# Patient Record
Sex: Female | Born: 1950
Health system: Southern US, Community
[De-identification: ages and names within clinical notes are randomized; demographics above are authoritative.]

## PROBLEM LIST (undated history)

## (undated) DIAGNOSIS — F419 Anxiety disorder, unspecified: Secondary | ICD-10-CM

## (undated) DIAGNOSIS — G8929 Other chronic pain: Secondary | ICD-10-CM

## (undated) DIAGNOSIS — I10 Essential (primary) hypertension: Secondary | ICD-10-CM

## (undated) DIAGNOSIS — F32A Depression, unspecified: Secondary | ICD-10-CM

## (undated) DIAGNOSIS — R7303 Prediabetes: Secondary | ICD-10-CM

## (undated) DIAGNOSIS — M549 Dorsalgia, unspecified: Secondary | ICD-10-CM

## (undated) DIAGNOSIS — M542 Cervicalgia: Secondary | ICD-10-CM

## (undated) DIAGNOSIS — G709 Myoneural disorder, unspecified: Secondary | ICD-10-CM

## (undated) DIAGNOSIS — M4800 Spinal stenosis, site unspecified: Secondary | ICD-10-CM

## (undated) HISTORY — PX: FOOT SURGERY: SHX648

## (undated) HISTORY — DX: Dorsalgia, unspecified: M54.9

## (undated) HISTORY — DX: Prediabetes: R73.03

## (undated) HISTORY — DX: Spinal stenosis, site unspecified: M48.00

## (undated) HISTORY — PX: KNEE ARTHROSCOPY: SHX127

## (undated) HISTORY — DX: Cervicalgia: M54.2

## (undated) HISTORY — DX: Other chronic pain: G89.29

---

## 1998-07-16 ENCOUNTER — Encounter: Payer: Self-pay | Admitting: Neurosurgery

## 1998-07-16 ENCOUNTER — Ambulatory Visit (HOSPITAL_COMMUNITY): Admission: RE | Admit: 1998-07-16 | Discharge: 1998-07-16 | Payer: Self-pay | Admitting: Neurosurgery

## 1998-08-01 ENCOUNTER — Ambulatory Visit (HOSPITAL_COMMUNITY): Admission: RE | Admit: 1998-08-01 | Discharge: 1998-08-01 | Payer: Self-pay | Admitting: Neurosurgery

## 1998-08-01 ENCOUNTER — Encounter: Payer: Self-pay | Admitting: Neurosurgery

## 1998-08-20 ENCOUNTER — Encounter: Payer: Self-pay | Admitting: Neurosurgery

## 1998-08-20 ENCOUNTER — Ambulatory Visit (HOSPITAL_COMMUNITY): Admission: RE | Admit: 1998-08-20 | Discharge: 1998-08-20 | Payer: Self-pay | Admitting: Neurosurgery

## 1998-12-24 ENCOUNTER — Emergency Department (HOSPITAL_COMMUNITY): Admission: EM | Admit: 1998-12-24 | Discharge: 1998-12-24 | Payer: Self-pay | Admitting: Emergency Medicine

## 1999-04-24 ENCOUNTER — Encounter: Payer: Self-pay | Admitting: Neurosurgery

## 1999-04-24 ENCOUNTER — Ambulatory Visit (HOSPITAL_COMMUNITY): Admission: RE | Admit: 1999-04-24 | Discharge: 1999-04-24 | Payer: Self-pay | Admitting: Neurosurgery

## 1999-05-23 ENCOUNTER — Encounter: Payer: Self-pay | Admitting: Neurosurgery

## 1999-05-23 ENCOUNTER — Ambulatory Visit (HOSPITAL_COMMUNITY): Admission: RE | Admit: 1999-05-23 | Discharge: 1999-05-23 | Payer: Self-pay | Admitting: Neurosurgery

## 2003-08-12 HISTORY — PX: BACK SURGERY: SHX140

## 2003-10-02 ENCOUNTER — Encounter: Admission: RE | Admit: 2003-10-02 | Discharge: 2003-10-02 | Payer: Self-pay | Admitting: *Deleted

## 2004-07-17 ENCOUNTER — Ambulatory Visit: Payer: Self-pay | Admitting: Physical Medicine & Rehabilitation

## 2004-07-17 ENCOUNTER — Inpatient Hospital Stay (HOSPITAL_COMMUNITY): Admission: RE | Admit: 2004-07-17 | Discharge: 2004-07-22 | Payer: Self-pay | Admitting: Orthopaedic Surgery

## 2005-02-06 ENCOUNTER — Ambulatory Visit: Payer: Self-pay

## 2005-11-21 ENCOUNTER — Ambulatory Visit: Payer: Self-pay

## 2008-03-21 ENCOUNTER — Ambulatory Visit: Payer: Self-pay

## 2009-05-17 ENCOUNTER — Observation Stay (HOSPITAL_COMMUNITY): Admission: EM | Admit: 2009-05-17 | Discharge: 2009-05-18 | Payer: Self-pay | Admitting: Emergency Medicine

## 2010-04-09 ENCOUNTER — Ambulatory Visit: Payer: Self-pay

## 2010-11-14 LAB — URINE CULTURE: Colony Count: 30000

## 2010-11-14 LAB — BASIC METABOLIC PANEL
BUN: 12 mg/dL (ref 6–23)
CO2: 27 mEq/L (ref 19–32)
Calcium: 9.1 mg/dL (ref 8.4–10.5)
Chloride: 102 mEq/L (ref 96–112)
Creatinine, Ser: 0.65 mg/dL (ref 0.4–1.2)
GFR calc Af Amer: >60 mL/min (ref >60)
GFR calc non Af Amer: >60 mL/min (ref >60)
Glucose, Bld: 91 mg/dL (ref 70–99)
Potassium: 3.8 mEq/L (ref 3.5–5.1)
Sodium: 138 mEq/L (ref 135–145)

## 2010-11-14 LAB — DIFFERENTIAL
Basophils Absolute: 0 10*3/uL (ref 0.0–0.1)
Basophils Relative: 0 % (ref 0–1)
Eosinophils Absolute: 0.1 10*3/uL (ref 0.0–0.7)
Eosinophils Relative: 1 % (ref 0–5)
Lymphocytes Relative: 27 % (ref 12–46)
Lymphs Abs: 2.4 10*3/uL (ref 0.7–4.0)
Monocytes Absolute: 0.6 10*3/uL (ref 0.1–1.0)
Monocytes Relative: 7 % (ref 3–12)
Neutro Abs: 5.6 10*3/uL (ref 1.7–7.7)
Neutrophils Relative %: 64 % (ref 43–77)

## 2010-11-14 LAB — CBC
HCT: 39.8 % (ref 36.0–46.0)
Hemoglobin: 13.5 g/dL (ref 12.0–15.0)
MCHC: 33.9 g/dL (ref 30.0–36.0)
MCV: 93.4 fL (ref 78.0–100.0)
Platelets: 223 10*3/uL (ref 150–400)
RBC: 4.27 MIL/uL (ref 3.87–5.11)
RDW: 13.3 % (ref 11.5–15.5)
WBC: 8.7 10*3/uL (ref 4.0–10.5)

## 2010-11-14 LAB — URINALYSIS, ROUTINE W REFLEX MICROSCOPIC
Bilirubin Urine: NEGATIVE
Glucose, UA: NEGATIVE mg/dL
Hgb urine dipstick: NEGATIVE
Ketones, ur: NEGATIVE mg/dL
Nitrite: NEGATIVE
Protein, ur: NEGATIVE mg/dL
Specific Gravity, Urine: 1.01 (ref 1.005–1.030)
Urobilinogen, UA: 0.2 mg/dL (ref 0.0–1.0)
pH: 7 (ref 5.0–8.0)

## 2010-12-27 NOTE — H&P (Signed)
NAME:  Christina Ware, Christina Ware NO.:  [id number]   MEDICAL RECORD NO.:  [id number]          PATIENT TYPE:  INP   LOCATION:  NA                           FACILITY:  [facility]   PHYSICIAN:  Sharolyn Douglas, M.D.        DATE OF BIRTH:  12-13-1950   DATE OF ADMISSION:  DATE OF DISCHARGE:                                HISTORY & PHYSICAL   DATE OF ADMISSION:  July 17, 2004   CHIEF COMPLAINT:  Pain in my back and legs.   PRESENT ILLNESS:  This 60 year old white female seen by [place] for progressive  problems concerning low-back pain.  She states this pain has been going on  for nearly 35 years.  She has been trying to keep going as far as her day-to-  day activity is concerned, but has also seen at least 12 physicians for her  back problems.  Now she gets to the point where she has difficulty walking,  sitting, or even doing day-to-day housework.  She has had  antiinflammatories, epidural steroid injection therapy, chiropractic care,  bracing, etc.  Unfortunately, she continues with this pain.  She is  divorced.  She tries to maintain her own lifestyle but this pain is markedly  interfering with her day-to-day activities.  Degenerative changes are seen  throughout the lumbar spine but mostly at the L4-5 region.  MRI has been  done and it showed multiple-level degenerative disc disease most pronounced  at L3-L4, L4-L5, or L5-S1.  Severe foraminal narrowing is seen as well as  lateral recess stenosis.  After much consideration and taking to hand the  patient's current overall dissatisfaction with her life secondary to the  pain, it was felt she would benefit from surgical intervention, being  admitted for back surgery.   PAST MEDICAL HISTORY:  This lady has been in relatively good health  throughout her lifetime.  She has no medical allergies and her current  medications are:  1.  Diazepam 10 mg one daily,  2.  Carisoprodol 300 mg one to three a day.  3.  Ibuprofen 800 mg daily.  4.   Darvocet for pain.  5.  Clarinex p.r.n.   PAST SURGERIES:  Include arthoscopic surgery of the right knee in 1987.   FAMILY HISTORY:  Positive for heart disease in her father and brother who is  deceased, hypertension in her father and mother and brothers, diabetes in  nearly her entire family, prostate cancer in the father, and two mini  strokes in the father as well.   SOCIAL HISTORY:  The patient is divorced, disabled.  No intake of alcohol or  tobacco products.  Has no children.  Caregiver after surgery will be her  mother.   REVIEW OF SYSTEMS:  CNS:  No seizure disorder, paralysis, numbness, or  double vision.  The patient does have radiculitis in both lower extremities.  CARDIOVASCULAR:  No chest pain, no angina or orthopnea.  RESPIRATORY:  No  productive cough, no hemoptysis, no shortness of breath.  GASTROINTESTINAL:  No nausea, vomiting, melena, or bloody stools.  GENITOURINARY:  No  discharge, dysuria, hematuria.  MUSCULOSKELETAL:  Primarily in present  illness.   PHYSICAL EXAMINATION:  GENERAL:  Alert, cooperative, friendly, somewhat  anxious 60 year old white female.  VITAL SIGNS:  Blood pressure 128/62, pulse 80, respirations are 12.  HEENT:  Normocephalic, PERRLA, EOM intact, oropharynx is clear.  CHEST:  Clear to auscultation, no rhonchi, no rales.  HEART:  Regular rate and rhythm, no murmurs are heard.  ABDOMEN:  Soft, nontender.  Liver and spleen not felt.  GENITOURINARY, RECTAL, PELVIC, BREAST:  Not done, not pertinent to present  illness.  EXTREMITIES:  No deformities and straight leg raise negative bilaterally.   ADMITTING DIAGNOSIS:  Degenerative disc disease with spinal stenosis.   PLAN:  The patient will undergo from L3-S1 laminectomy with posterior spinal  fusion, transforaminal lumbar interbody fusion with pedicle screws,  allograft bone, morphogenic protein, and possible posterior iliac crest bone  graft.  She is fitted today with an EBI lumbar orthosis.   This history and  physical is performed in the [facility] office on July 10, 2004.  She has had medical clearance from Dr. Raliegh Ip, [facility] in  [place], [place].       DLU/MEDQ  D:  07/10/2004  T:  07/10/2004  Job:  [id number]   cc:   Loma Sender  [address]  [place]  [place] [address]  Fax: [phone]

## 2010-12-27 NOTE — Op Note (Signed)
NAME:  Christina Ware, Christina Ware NO.:  [id number]   MEDICAL RECORD NO.:  [id number]          PATIENT TYPE:  INP   LOCATION:  5025                         FACILITY:  [facility]   PHYSICIAN:  Sharolyn Douglas, M.D.        DATE OF BIRTH:  June 26, 1951   DATE OF PROCEDURE:  DATE OF DISCHARGE:                                 OPERATIVE REPORT   DIAGNOSIS:  Lumbar degenerative disk disease, spondylosis and spinal  stenosis, L3-S1.   PROCEDURE:  1.  Left L3-S1 hemilaminectomies with decompression of the L3-S1 nerve      roots.  2.  Transforaminal lumbar interbody fusion, L3-S1, placement of three peak      cages.  3.  Posterior spinal arthrodesis, L3-S1.  4.  Segmental pedicle screw instrumentation, L3-S1 using the spinal Concept      system.  5.  Local autogenous bone graft supplemented with graft on and bone      morphogenic protein.  6.  Neuro monitoring with testing of eight pedicle screws and monitoring of      free running electromyelograms x 3 hours.   SURGEON:  Sharolyn Douglas, M.D.   ASSISTANT:  Verlin Fester, P.A.   ANESTHESIA:  General endotracheal.   COMPLICATIONS:  None.   INDICATIONS:  The patient is a pleasant 60 year old female with intractable  back and bilateral lower extremity pain, left greater than right.  This is  felt to be secondary to lateral recess and foraminal narrowing, L3-S1 with  associated degenerative disk disease and spondylosis.  She has elected to  undergo L3-S1 decompression and fusion in hopes of improving her symptoms.  The risks, benefits and alternatives were extensively reviewed.   PROCEDURE:  The patient was properly identified in the holding area, taken  to the operating room.  She underwent general endotracheal anesthesia  without difficulty and prophylactic IV antibiotics.  Carefully positioned  prone on the Wilson frame.  All bony prominences padded.  Face unobstructed  at all times.  The back prepped and draped in the usual sterile fashion.   A  midline incision made from L2-S1.  Dissection carried through the deep  fascia.  Subperiosteal exposure carried out to the tips of the transverse  processes, L3-L5 as well as the sacral ala bilaterally.  Deep retractors  placed.  Anatomic landmarks defined.  A hemilaminectomy completed at L3-S1  on the left side.  The L3-S1 nerve roots were identified and completely  decompressed in the lateral recess as well as out the respective foramen.  We then turned our attention to completing transforaminal lumbar interbody  fusions at each level.  The pars interarticularis was osteotomized at L3-L5  removing the inferior facet.  This appears that was cut flush with the  pedicle.  This created the transforaminal window.  Free-running EMGs were  monitored throughout the T-lift procedures, and there were no deleterious  changes.  The exiting and transversing nerve roots were identified and  protected at all times.  At each level, sharp annulotomy was utilized  followed by a radical diskectomy across the contralateral side using  specialized T-lift curettes.  The  cartilage endplates were scraped.  The  interbody spaces were dilated up using intervertebral spreaders.  We also  achieved distraction on the spinous processes.  The disk spaces were packed  with BMP sponges from a large infuse kit.  The interbody spaces were packed  with local autogenous bone graft from the laminectomies.  The peak cages  were packed with the BMP sponges and then inserted in an oblique fashion  into the interspaces.  We achieved good distraction at each level.  The  nerve roots were inspected, and there were no apparent injuries.  We then  turned our attention to completing the posterolateral arthrodesis.  High-  speed bur used to decorticate the transverse processes of L3-L5 and the  sacral ala bilaterally.  The remaining local autogenous bone graft along  with 15 mL of graft fill and allograft packed into the lateral  gutters.  We  then placed the pedicle screws at each level, L3-L5 as well as the sacrum  bilaterally using anatomic probing technique.  We utilized 6.5 x 45 mm  screws at L3-L5.  The 7.5 x 35 mm screws used in the sacrum bilaterally.  We  had good screw purchase.  Each screw was then stimulated using triggered  EMGs, and there were no deleterious changes.  Titanium rods were then bent  into lordosis and placed into polyaxial heads.  Compression was applied  across each segment before shearing off the locking caps.  Intraoperative x-  ray showed acceptable positioning of the instrumentation, L3-S1.  The wound  was irrigated. Gelfoam was left over the exposed epidural space.  Deep drain  left.  The deep fascia closed with a running #1 Vicryl suture.  Subcutaneous  layer closed with 2-0 Vicryl followed by a running 3-0 subcuticular Vicryl  suture on the skin edges.  Benzoin and Steri-Strips were placed.  A sterile  dressing applied.  The patient was turned supine and extubated without  difficulty and transferred to the recovery room in stable condition.       MC/MEDQ  D:  07/20/2004  T:  07/21/2004  Job:  [id number]   cc:   Dept of [facility]

## 2010-12-27 NOTE — Discharge Summary (Signed)
NAME:  Christina Ware, Christina Ware NO.:  [id number]   MEDICAL RECORD NO.:  [id number]          PATIENT TYPE:  INP   LOCATION:  5025                         FACILITY:  [facility]   PHYSICIAN:  Sharolyn Douglas, M.D.        DATE OF BIRTH:  27-Apr-1951   DATE OF ADMISSION:  07/17/2004  DATE OF DISCHARGE:  07/22/2004                                 DISCHARGE SUMMARY   ADMISSION DIAGNOSIS:  Degenerative disc disease with spinal stenosis at L3-  S1.   DISCHARGE DIAGNOSES:  1.  Status post L3-S1 posterior spinal fusion with pedicle screws and T-      lift, doing well.  2.  Postoperative anemia, stable, not requiring transfusion.   PROCEDURE:  On July 17, 2004, the patient was taken to the operating room  for L3-S1 posterior spinal fusion with pedicle screws with T-lift performed  by Dr. Sharolyn Douglas, assistant [facility].  Anesthesia was general.   CONSULTATIONS:  None.   LABORATORY DATA AND X-RAY FINDINGS:  Preoperatively, CBC with differential  was within normal limits with the exception of white count of 10.8.  H&H  were monitored postoperatively and reached a low of 9.2 and 26.5.  On  July 22, 2004, she was asymptomatic.  PT/INR and PTT preoperatively  normal.  Complete metabolic panel preoperatively normal.  Postoperatively,  BMET had slightly elevation of glucose at 143.  UA negative.  Blood type on  July 22, 2004, type A, antibody screen negative.   Portable spine was used intraoperatively for localization.  EKG from  December 2, showed normal sinus rhythm.  Cannot rule out anterior infarct  read by Dr. Charlton Haws.   HISTORY OF PRESENT ILLNESS:  The patient is a 60 year old female who has  been having progressive problems with back prior to surgery.  It has been  going on for in excess of 30 years.  Unfortunately, over the last several  years, it has progressed and gotten to the point that it was interfering  with her day to day activities.  She has tried  numerous types of  conservative management including antiinflammatory medications, narcotic  pain medication, epidural steroid injections, chiropractic care, activity  modification, all without any improvement in her pain.  Because of her  significant findings on x-ray as well as MRI scan and her failure to improve  with conservative measures, Dr. Noel Gerold felt her best course of management  would be the above-listed procedure.  He discussed the risks and benefits of  the procedure with the patient and seemed to indicate understanding and  opted to proceed.   HOSPITAL COURSE:  On July 17, 2004, the patient was taken to the  operating room for the above-listed procedure and tolerated procedure well  without any intraoperative complications.  There was one Hemovac drain  placed with approximately 250 cc of blood loss.  She was transferred to the  recovery room in stable condition.   Postoperatively, routine orthopedic spine protocol was followed.  She  progressed along very well and did not develop any complications.   On postop day #2, Hemovac drain was discontinued  without any difficulty.  Dressing changes were done.  Rehabilitation consult was ordered because she  was progressing slowly with physical therapy.  However, over the next  several days, she made significant improvement in her progress with therapy  and by July 22, 2004, the patient had met all orthopedic goals and was  doing very well.  She was stable and ready for discharge from an orthopedic  and medical standpoint.   SUMMARY:  The patient is a 60 year old female, status post L3-S1 posterior  spinal fusion doing well.   ACTIVITY:  Daily ambulation.  Back precautions at all times.  Brace on when  she is up.  No lifting heavier than 5 pounds.   WOUND CARE:  Dressing changes daily.  Keep incision dry until drainage is  completely stopped.   DISCHARGE MEDICATIONS:  1.  Robaxin as needed for muscle spasm.  2.   Percocet as needed for pain.  3.  Multivitamin daily.  4.  Calcium daily.  5.  Over-the-counter laxative as needed.  6.  Colace twice daily.  7.  Resume home medications.   DIET:  Regular home diet as tolerated.   FOLLOW UP:  Follow up in 2 weeks postoperatively with Dr. Noel Gerold.   CONDITION ON DISCHARGE:  Stable and improved.   DISPOSITION:  The patient is being discharged to her home with her family's  assistance as well as home health physical therapy and occupational therapy.      CM/MEDQ  D:  10/02/2004  T:  10/02/2004  Job:  [id number]

## 2011-05-01 ENCOUNTER — Ambulatory Visit: Payer: Self-pay

## 2012-05-14 ENCOUNTER — Observation Stay: Payer: Self-pay | Admitting: Internal Medicine

## 2012-05-14 LAB — URINALYSIS, COMPLETE
Bacteria: NONE SEEN
Bilirubin,UR: NEGATIVE
Blood: NEGATIVE
Glucose,UR: NEGATIVE mg/dL (ref 0–75)
Ketone: NEGATIVE
Leukocyte Esterase: NEGATIVE
Nitrite: NEGATIVE
Ph: 9 (ref 4.5–8.0)
Protein: NEGATIVE
RBC,UR: 1 /HPF (ref 0–5)
Specific Gravity: 1.02 (ref 1.003–1.030)
Squamous Epithelial: <1
WBC UR: 1 /HPF (ref 0–5)

## 2012-05-14 LAB — COMPREHENSIVE METABOLIC PANEL
Albumin: 3.7 g/dL (ref 3.4–5.0)
Alkaline Phosphatase: 99 U/L (ref 50–136)
Anion Gap: 13 (ref 7–16)
BUN: 16 mg/dL (ref 7–18)
Bilirubin,Total: 0.3 mg/dL (ref 0.2–1.0)
Calcium, Total: 9.1 mg/dL (ref 8.5–10.1)
Chloride: 104 mmol/L (ref 98–107)
Co2: 25 mmol/L (ref 21–32)
Creatinine: 0.74 mg/dL (ref 0.60–1.30)
EGFR (African American): >60
EGFR (Non-African Amer.): >60
Glucose: 153 mg/dL — ABNORMAL HIGH (ref 65–99)
Osmolality: 287 (ref 275–301)
Potassium: 3.8 mmol/L (ref 3.5–5.1)
SGOT(AST): 24 U/L (ref 15–37)
SGPT (ALT): 21 U/L (ref 12–78)
Sodium: 142 mmol/L (ref 136–145)
Total Protein: 7.6 g/dL (ref 6.4–8.2)

## 2012-05-14 LAB — CBC
HCT: 42 % (ref 35.0–47.0)
HGB: 13.8 g/dL (ref 12.0–16.0)
MCH: 29.9 pg (ref 26.0–34.0)
MCHC: 32.8 g/dL (ref 32.0–36.0)
MCV: 91 fL (ref 80–100)
Platelet: 246 10*3/uL (ref 150–440)
RBC: 4.6 10*6/uL (ref 3.80–5.20)
RDW: 13.6 % (ref 11.5–14.5)
WBC: 15.5 10*3/uL — ABNORMAL HIGH (ref 3.6–11.0)

## 2012-05-14 LAB — MAGNESIUM: Magnesium: 1.8 mg/dL

## 2012-05-14 LAB — TROPONIN I: Troponin-I: <0.02 ng/mL

## 2012-05-14 LAB — LIPASE, BLOOD: Lipase: 241 U/L (ref 73–393)

## 2012-05-15 LAB — COMPREHENSIVE METABOLIC PANEL
Albumin: 3.2 g/dL — ABNORMAL LOW (ref 3.4–5.0)
Alkaline Phosphatase: 80 U/L (ref 50–136)
Anion Gap: 9 (ref 7–16)
BUN: 7 mg/dL (ref 7–18)
Bilirubin,Total: 0.4 mg/dL (ref 0.2–1.0)
Calcium, Total: 8.2 mg/dL — ABNORMAL LOW (ref 8.5–10.1)
Chloride: 109 mmol/L — ABNORMAL HIGH (ref 98–107)
Co2: 26 mmol/L (ref 21–32)
Creatinine: 0.73 mg/dL (ref 0.60–1.30)
EGFR (African American): >60
EGFR (Non-African Amer.): >60
Glucose: 122 mg/dL — ABNORMAL HIGH (ref 65–99)
Osmolality: 286 (ref 275–301)
Potassium: 3.6 mmol/L (ref 3.5–5.1)
SGOT(AST): 19 U/L (ref 15–37)
SGPT (ALT): 17 U/L (ref 12–78)
Sodium: 144 mmol/L (ref 136–145)
Total Protein: 6.7 g/dL (ref 6.4–8.2)

## 2012-05-15 LAB — LIPID PANEL
Cholesterol: 155 mg/dL (ref 0–200)
HDL Cholesterol: 81 mg/dL — ABNORMAL HIGH (ref 40–60)
Ldl Cholesterol, Calc: 57 mg/dL (ref 0–100)
Triglycerides: 86 mg/dL (ref 0–200)
VLDL Cholesterol, Calc: 17 mg/dL (ref 5–40)

## 2012-05-15 LAB — LIPASE, BLOOD: Lipase: 57 U/L — ABNORMAL LOW (ref 73–393)

## 2012-06-29 ENCOUNTER — Ambulatory Visit: Payer: Self-pay

## 2013-04-13 ENCOUNTER — Ambulatory Visit: Payer: Self-pay | Admitting: Urology

## 2013-05-09 ENCOUNTER — Ambulatory Visit: Payer: Self-pay | Admitting: Urology

## 2013-05-13 ENCOUNTER — Ambulatory Visit: Payer: Self-pay | Admitting: Urology

## 2013-06-30 ENCOUNTER — Ambulatory Visit: Payer: Self-pay

## 2014-07-19 ENCOUNTER — Ambulatory Visit: Payer: Self-pay

## 2014-11-28 NOTE — H&P (Signed)
PATIENT NAME:  Christina Ware, [name] S MR#:  [id number] DATE OF BIRTH:  July 21, 1951  DATE OF ADMISSION:  05/14/2012  PRIMARY CARE PHYSICIAN: Marcine Matarharles W. Phillips Jr., MD   CHIEF COMPLAINT: Nausea, vomiting, diarrhea.   HISTORY OF PRESENT ILLNESS: The patient is a 64 year old female who presents to the Emergency Room due to sudden onset of nausea, vomiting, diarrhea that began late last night. The patient says she has had multiple episodes of nausea, vomiting, and diarrhea since last evening. The vomiting has been nonbloody and now bilious in nature, and the diarrhea initially started off with soft stool but is now watery in nature. She admits to some chills but no documented fever. She complains of abdominal cramping which is lower abdomen below the umbilicus. She said she has had a history of gastroenteritis before, and the symptoms are very similar to that. She ate some lasagna and some chocolate mousse that she bought from [employer] yesterday, although nobody else ate that food; so, therefore, it is unclear whether this is related to gastroenteritis. The patient did undergo a CT scan of the abdomen and pelvis without contrast which was suggestive of pancreatitis/duodenitis, although she has a normal lipase level. Hospitalist Services were contacted for further treatment and evaluation.   REVIEW OF SYSTEMS: CONSTITUTIONAL: No documented fever. No weight gain, no weight loss. EYES: No blurred or double vision. ENT: No tinnitus. No postnasal drip. No redness of the oropharynx. RESPIRATORY: No cough, no wheeze, no hemoptysis, and no dyspnea. CARDIOVASCULAR: No chest pain, no orthopnea, no palpitations, no syncope. GI: Positive nausea. Positive vomiting. Positive diarrhea. Positive lower abdominal pain, no melena or hematochezia. GU: No dysuria or hematuria. ENDOCRINE: No polyuria or nocturia. No heat or cold intolerance. HEMATOLOGIC: No anemia, no bruising, and no bleeding. INTEGUMENTARY: No rashes. No  lesions. MUSCULOSKELETAL: No arthritis, no swelling, no gout. NEUROLOGIC: No numbness, no tingling, no ataxia, no seizure type activity. PSYCHIATRIC: Positive anxiety. No insomnia, no ADD.    PAST MEDICAL HISTORY:  1. Anxiety.  2. Neuropathy.  3. Chronic pain syndrome.   ALLERGIES: Codeine, which causes a GI upset.   SOCIAL HISTORY: No smoking. No alcohol abuse. No illicit drug abuse. She lives at home by herself.   FAMILY HISTORY: The patient's mother died from complications of colon cancer. Her father died from complications of chronic obstructive pulmonary disease.   CURRENT MEDICATIONS:  1. Paxil 10 mg daily.  2. Gabapentin 800 mg t.i.d.  3. Fentanyl patch 75 mcg every 72 hours.  4. Oxycodone with Tylenol 10/325 every 6 hours as needed.   PHYSICAL EXAMINATION:  VITAL SIGNS: She is afebrile, pulse is 68, respirations 18, blood pressure 165/78, sats 97% on room air.   GENERAL: She is a very lethargic-appearing female moaning in mild-to-moderate distress.   HEENT: Atraumatic, normocephalic. Her extraocular muscles are intact. Pupils are equal and reactive to light. Her sclerae are anicteric. No conjunctival injection. No oropharyngeal erythema.   NECK: Supple. No jugular venous distention. No bruits, no lymphadenopathy or thyromegaly.   HEART: Regular rate and rhythm. No murmurs, no rubs, and no clicks.   LUNGS: Clear to auscultation anteriorly. No rales, no rhonchi, no wheezes.   ABDOMEN: Soft, flat, tender in the lower abdomen but no rebound, no rigidity. Hypoactive bowel sounds. No hepatosplenomegaly appreciated.   EXTREMITIES: No evidence of any cyanosis, clubbing, or peripheral edema. Has +2 pedal and radial pulses bilaterally.   NEUROLOGIC: The patient is alert, awake, and oriented x3, lethargic. Moves all extremities spontaneously. No  other focal motor or sensory deficits appreciated.   SKIN: Moist and warm with no rashes.   LYMPHATIC: There is no cervical or axillary  lymphadenopathy.   LABORATORY, DIAGNOSTIC AND RADIOLOGICAL DATA: Serum glucose 153, BUN 16, creatinine 0.7, sodium 142, potassium 3.8, chloride 104, bicarbonate 25. LFTs are within normal limits. Troponin less than 0.02. White cell count 15.5, hemoglobin 13.8, hematocrit 42, platelet count 246. Lactic acid is elevated at 2.7. The patient did have a CT of the abdomen and pelvis done without contrast which showed peripancreatic inflammatory changes around the pancreatic head which may represent pancreatitis versus duodenitis.   ASSESSMENT AND PLAN: This is a 64 year old female with past medical history of anxiety/depression, neuropathy, chronic pain syndrome, presents to the hospital due to nausea, vomiting, and diarrhea, and CT findings suggestive of pancreatitis/duodenitis.   1. Nausea and vomiting: The exact etiology is currently unclear but suspected to be related to pancreatitis as the CT is suggestive of it, although her lipase level is normal. She has no history of alcohol abuse. Her LFTs are normal, therefore, unlikely biliary in nature. Questionable if this is related to gastroenteritis as she is having diarrhea along with this. For now, we will treat her supportively with IV fluids, antiemetics, and follow her clinically. I will repeat her lipase level in the morning, also go ahead and check a lipid profile to make sure that is not the cause of possibly her pancreatitis.  2. Diarrhea: Again, the etiology is unclear but seems to be related to gastroenteritis. CT scan is suggestive of duodenitis. I will get stool for comprehensive culture and C. difficile. Continue supportive care. Keep her n.p.o. except for ice chips and medications for now. If needed, if her symptoms persist, consider getting a GI consult.  3. Neuropathy: Continue Neurontin.  4. Anxiety: Continue Paxil.  5. Chronic pain syndrome: I will continue with a fentanyl patch for now. She is on the lethargic side, and therefore I will hold  her Norco at this point.   CODE STATUS:  The patient is a FULL CODE.     TIME SPENT: 45 minutes.  ____________________________ Rolly Pancake. Cherlynn Kaiser, MD vjs:cbb D: 05/14/2012 18:06:46 ET T: 05/14/2012 18:24:36 ET JOB#: [id number] cc: Marcine Matar., MD Houston Siren MD ELECTRONICALLY SIGNED 05/22/2012 14:57

## 2014-11-28 NOTE — Discharge Summary (Signed)
PATIENT NAME:  Christina Ware, Christina S MR#:  Ware DATE OF BIRTH:  28-Feb-1951  DATE OF ADMISSION:  05/14/2012 DATE OF DISCHARGE:  05/15/2012  PRESENTING COMPLAINT: Nausea, vomiting and diarrhea.   DISCHARGE DIAGNOSES:  1. Acute gastroenteritis likely from food poisoning.   2. Dehydration, improved.   CONDITION ON DISCHARGE: Fair. Vitals stable.   MEDICATIONS:  1. Fentanyl patch 75 mcg per hour, one patch every three days.  2. Gabapentin 800 mg t.i.d.  3. Paxil 10 mg daily.  4. Acetaminophen/oxycodone 325/10, 0.25 tablets every four hours as needed for pain.  5. Docusate 100 mg daily.  6. Black cohosh 540 1 capsule daily.  7. Alpha lipoic acid 100 mg 4 capsules orally daily.  8. L-carnitine 400 mg b.i.d.  9. Omega 600 mg capsule 1 tablet b.i.d.  10. Estrace 0.1 mg vaginal cream application Monday, Wednesday, Friday.  11. Zofran 4 mg ODT b.i.d. p.r.n.   DIET: Mechanical soft diet.   FOLLOWUP: Follow up with Dr. Loma Senderharles Phillips in 1 to 2 weeks.   LABORATORY, DIAGNOSTIC AND RADIOLOGICAL DATA: Comprehensive metabolic panel within normal limits except calcium of 8.2 and chloride of 109. Lipase 57, lactic acid 2.7. Urinalysis negative for urinary tract infection. CT of the abdomen and pelvis is suggestive of peripancreatic inflammatory changes around the pancreatic head which may reflect pancreatitis versus duodenitis. White count 15.5, hemoglobin and hematocrit 13.8 and 42.0. Magnesium 1.8.    BRIEF SUMMARY OF HOSPITAL COURSE:  1. [name] is a pleasant 64 year old Caucasian female with past medical history of anxiety, depression, neuropathy, chronic pain syndrome presents to the hospital with nausea, vomiting, diarrhea after she had some prepared food bought from the grocery store. Patient was admitted with nausea, vomiting, diarrhea, likely gastroenteritis due to food poisoning. It resolved. She started feeling hungry, started on clear liquid diet and advanced to full liquid diet  prior to discharge. Did not have any more fever. Clinically appeared dehydrated, received IV fluids.  2. CT suggestive of pancreatitis, duodenitis. Patient did not have symptoms and signs suggestive of pancreatitis. Findings could have been reflected from the nausea, vomiting she had. Lipase checked twice was negative.  3. Neuropathy. Continue Neurontin.  4. Anxiety. Paxil was continued.  5. Chronic pain syndrome. Patient was on fentanyl and her Norco as before.  6. She remained otherwise stable and remained a FULL CODE.   TIME SPENT: 40 minutes.   ____________________________ Wylie HailSona A. Allena KatzPatel, MD sap:cms D: 05/16/2012 11:24:24 ET T: 05/16/2012 11:33:12 ET JOB#: [id number]  cc: Emmajean Ratledge A. Allena KatzPatel, MD, <Dictator> Marcine Matarharles W. Phillips Jr., MD  Willow OraSONA A Glenetta Kiger MD ELECTRONICALLY SIGNED 05/19/2012 9:29

## 2014-12-01 NOTE — Op Note (Signed)
PATIENT NAME:  Christina Ware, Christina Ware MR#:  [id number] DATE OF BIRTH:  06-10-1951  DATE OF PROCEDURE:  05/09/2013  PREOPERATIVE DIAGNOSIS: Incontinence.   POSTOPERATIVE DIAGNOSIS: Incontinence.   PROCEDURE: Peripheral nerve evaluation.   SURGEON: Richard D. Edwyna ShellHart, MD.  ANESTHESIA: Local MAC.   DESCRIPTION OF PROCEDURE: With the patient sterilely prepped and draped in the prone position under fair relaxation but conscious with MAC sedation, we marked the positions of the foramen in the caudad-to-cephalad position and then marked the position of S3, utilizing the posterior iliac spine. I then take a 3-inch needle and numb up the area about 2 or 3 fingerbreadths above the position of the S3 at the proper position and then insert the 3-inch needle into S1 without difficulty. Fluoroscopy was used throughout. Two needle sticks on the left and 1 on the right accomplished the purpose. Bellows and toe flexion is obtained. The patient feels stimulation in the vagina. (Dictation Anomaly)that is proper for placement of the small temporary electrodes are placed through the foramen of the 3-inch needle and tested in good position. The 3-inch needle is withdrawn from each tiny wire. The center portion of the wire is also withdrawn at this time. A stiffening center portion is withdrawn. The wires are in position and held in good position with good stimulation occurring with everything withdrawn. Then the wires are coiled and anchored in place with 3 Steri-Strips and benzoin. The areas are covered with dressings to hold it in place. We used multiple numbers of clear dressings to keep it dry. The electrodes are Education administrator(Dictation Anomalycarefully) and artfully put in a temporary pocket posteriorly and 1 is  connected to the box. The patient is sent to recovery in satisfactory condition.   ____________________________ Caralyn Guileichard D. Edwyna ShellHart, DO rdh:np D: 05/09/2013 18:10:38 ET T: 05/09/2013 20:05:04 ET JOB#: [id number]  cc: Caralyn Guileichard D.  Edwyna ShellHart, DO, <Dictator> RICHARD D HART DO ELECTRONICALLY SIGNED 05/13/2013 13:40

## 2014-12-01 NOTE — Op Note (Signed)
PATIENT NAME:  [name], Christina S MR#:  [id number] DATE OF BIRTH:  25-Apr-1951  DATE OF PROCEDURE:  05/13/2013  SURGEON:  Trey Paulaichard Hart, DO  PREOPERATIVE DIAGNOSIS: Incontinence, overactive bladder.   POSTOPERATIVE DIAGNOSIS: Incontinence, overactive bladder.    PROCEDURE:   1.  Implantation of a [employer] nerve stimulator for incontinence at S3.  2.  Fluoroscopy for implantation.  3.  Evaluation of stimulator and programming of stimulator for the S3 implantation.    With the patient sterilely prepped and draped in the prone position, we used fluoroscopy to localize the needle in S3. There is no difficulty localizing this needle in S3. She had a previous peripheral nerve evaluation and we are approximately in the same area of needle. The needle was then tested for bellows and plantar flexion. There was terrific toe response and very good bellows. The obturator was withdrawn and we placed a guide under fluoro into the S3 foramen and near the nerve. Then the 3 inch needle is withdrawn and a small incision made adjacent to the guide. Then I placed an obturator and electrode introducer into the foramen and removed the guide and the obturator of the introducer. Through the introducer, I placed the permanent electrode.  It is then viewed in the PA and lateral positions and is in good position. We test it so that we get good bellows and good toe response on all four leads. The lead is then left in and the obturator withdrawn and the center of the lead withdrawn. The stimulator is placed in the right buttocks in the subcutaneous area through a small 3-inch incision. Then it is placed beneath the fat. Then I tunnel the electrode over to the neurostimulator and place it in the neurostimulator. I place the neurostimulator under the subcutaneous fat and test it.  All 4 leads are working well with preprogrammed neurostimulator, so we are able to then close both incisions with a 2-0 Vicryl. The patient tolerates  this well. We used Dermabond over the incision after closing at. Antibiotic solution is used within the solution just before closing. She is sent to recovery in satisfactory condition.    ____________________________ Caralyn Guileichard D. Edwyna ShellHart, DO rdh:dp D: 05/13/2013 11:42:16 ET T: 05/13/2013 11:55:49 ET JOB#: [id number]  cc: Caralyn Guileichard D. Edwyna ShellHart, DO, <Dictator> RICHARD D HART DO ELECTRONICALLY SIGNED 05/13/2013 13:43

## 2015-02-06 ENCOUNTER — Other Ambulatory Visit: Payer: Self-pay | Admitting: Physical Medicine and Rehabilitation

## 2015-02-09 ENCOUNTER — Other Ambulatory Visit: Payer: Self-pay | Admitting: Physical Medicine and Rehabilitation

## 2015-02-09 DIAGNOSIS — M961 Postlaminectomy syndrome, not elsewhere classified: Secondary | ICD-10-CM

## 2015-04-19 ENCOUNTER — Ambulatory Visit
Admission: RE | Admit: 2015-04-19 | Discharge: 2015-04-19 | Disposition: A | Discharge status: Home / Self Care | Payer: Medicare Other | Source: Ambulatory Visit | Attending: Physical Medicine and Rehabilitation | Admitting: Physical Medicine and Rehabilitation

## 2015-04-19 DIAGNOSIS — M961 Postlaminectomy syndrome, not elsewhere classified: Secondary | ICD-10-CM

## 2015-04-19 MED ORDER — IOHEXOL 180 MG/ML  SOLN
15.0000 mL | Freq: Once | INTRAMUSCULAR | Status: DC | PRN
  Administered 2015-04-19: 15 mL via INTRATHECAL

## 2015-04-19 MED ORDER — DIAZEPAM 5 MG PO TABS
10.0000 mg | ORAL_TABLET | Freq: Once | ORAL | Status: AC
  Administered 2015-04-19: 10 mg via ORAL

## 2015-04-19 NOTE — Progress Notes (Signed)
Patient states she has been off Phentermine for at least the past two days.  Donell Sievert, RN

## 2015-04-19 NOTE — Discharge Instructions (Signed)
Myelogram Discharge Instructions  1. Go home and rest quietly for the next 24 hours.  It is important to lie flat for the next 24 hours.  Get up only to go to the restroom.  You may lie in the bed or on a couch on your back, your stomach, your left side or your right side.  You may have one pillow under your head.  You may have pillows between your knees while you are on your side or under your knees while you are on your back.  2. DO NOT drive today.  Recline the seat as far back as it will go, while still wearing your seat belt, on the way home.  3. You may get up to go to the bathroom as needed.  You may sit up for 10 minutes to eat.  You may resume your normal diet and medications unless otherwise indicated.  Drink plenty of extra fluids today and tomorrow.  4. The incidence of a spinal headache with nausea and/or vomiting is about 5% (one in 20 patients).  If you develop a headache, lie flat and drink plenty of fluids until the headache goes away.  Caffeinated beverages may be helpful.  If you develop severe nausea and vomiting or a headache that does not go away with flat bed rest, call [phone].  5. You may resume normal activities after your 24 hours of bed rest is over; however, do not exert yourself strongly or do any heavy lifting tomorrow.  6. Call your physician for a follow-up appointment.   You may resume Phentermine on Friday, April 20, 2015 after 1:00p.m.

## 2015-07-12 ENCOUNTER — Ambulatory Visit: Payer: Self-pay | Admitting: Family Medicine

## 2015-07-26 ENCOUNTER — Encounter: Payer: Self-pay | Admitting: Family Medicine

## 2015-07-26 ENCOUNTER — Ambulatory Visit (INDEPENDENT_AMBULATORY_CARE_PROVIDER_SITE_OTHER): Payer: Medicare Other | Admitting: Family Medicine

## 2015-07-26 VITALS — BP 137/78 | HR 92 | Temp 97.7°F | Resp 16 | Ht 63.0 in | Wt 193.9 lb

## 2015-07-26 DIAGNOSIS — R7303 Prediabetes: Secondary | ICD-10-CM | POA: Diagnosis not present

## 2015-07-26 LAB — POCT GLYCOSYLATED HEMOGLOBIN (HGB A1C): HEMOGLOBIN A1C: 6

## 2015-07-26 LAB — GLUCOSE, POCT (MANUAL RESULT ENTRY): POC Glucose: 85 mg/dl (ref 70–99)

## 2015-07-26 NOTE — Progress Notes (Signed)
Name: Christina Ware   MRN: [id number]    DOB: [birth year]   Date:07/26/2015       Progress Note  Subjective  Chief Complaint  Chief Complaint  Patient presents with  . Establish Care    patient is transferring care from Dr. Loma Sender in [place]. patient goes to the pain clinic in [place].  . Labs Only    patient is fasting    HPI  Pt. is here to establish care. Previous PCP Dr. Vear Clock in [place]. Medications reviewed in detail with pt. Pt. Would like to have her blood work to check for Diabetes and Cholesterol. In the past, she has been told that she may be pre-diabetic, never been on any diabetes medications. Records from previous PCP not available.   Past Medical History  Diagnosis Date  . Prediabetes   . Chronic back pain greater than 3 months duration     Degenerative Disc Disease, surgery on lower back.  . Chronic neck pain     Ruptured disc in neck    Past Surgical History  Procedure Laterality Date  . Back surgery  2005    lower back  . Knee arthroscopy Right   . Foot surgery Bilateral     twice    Family History  Problem Relation Age of Onset  . Diabetes Mother   . Cancer Mother   . Hypertension Mother   . Diabetes Father   . Cancer Father   . Hypertension Father   . Diabetes Sister   . Hypertension Sister   . Diabetes Brother   . Cancer Brother   . Hypertension Brother   . Diabetes Brother   . Birth defects Brother     born with a hole in his heart  . Lung disease Brother     patient needed a lung transplant  . Heart disease Brother     needed a heart transplant    Social History   Social History  . Marital Status: Divorced    Spouse Name: N/A  . Number of Children: N/A  . Years of Education: N/A   Occupational History  . Not on file.   Social History Main Topics  . Smoking status: Former Games developer  . Smokeless tobacco: Not on file     Comment: quit when she was 64yrs old  . Alcohol Use: No  . Drug Use: No  .  Sexual Activity: No   Other Topics Concern  . Not on file   Social History Narrative  . No narrative on file     Current outpatient prescriptions:  .  Acetylcarnitine HCl (ACETYL L-CARNITINE PO), Take 400 mg by mouth., Disp: , Rfl:  .  Alpha-Lipoic Acid 200 MG TABS, Take by mouth., Disp: , Rfl:  .  Black Cohosh ([facility] BLACK COHOSH) 540 MG CAPS, Take by mouth., Disp: , Rfl:  .  Cinnamon 500 MG capsule, Take 500 mg by mouth daily., Disp: , Rfl:  .  docusate sodium (COLACE) 100 MG capsule, Take by mouth., Disp: , Rfl:  .  fentaNYL (DURAGESIC - DOSED MCG/HR) 75 MCG/HR, Place onto the skin., Disp: , Rfl:  .  gabapentin (NEURONTIN) 800 MG tablet, , Disp: , Rfl:  .  L-Arginine 500 MG CAPS, Take by mouth., Disp: , Rfl:  .  meloxicam (MOBIC) 7.5 MG tablet, , Disp: , Rfl:  .  Omega-3 Fatty Acids (FISH OIL) 1000 MG CAPS, Take by mouth., Disp: , Rfl:  .  Oxycodone HCl 10 MG  TABS, , Disp: , Rfl:  .  oxyCODONE-acetaminophen (PERCOCET) 10-325 MG tablet, Take 1 tablet by mouth every 4 (four) hours as needed for pain., Disp: , Rfl:  .  phentermine 37.5 MG capsule, Take 37.5 mg by mouth every morning., Disp: , Rfl:  .  vitamin E 400 UNIT capsule, Take 400 Units by mouth daily., Disp: , Rfl:  .  tiZANidine (ZANAFLEX) 4 MG tablet, Reported on 07/26/2015, Disp: , Rfl:   Allergies  Allergen Reactions  . Codeine Other (See Comments)  . Hydrocodone   . Latex      Review of Systems  Constitutional: Negative for fever, chills, weight loss and malaise/fatigue.  Respiratory: Negative for cough.   Cardiovascular: Negative for chest pain.    Objective  Filed Vitals:   07/26/15 0945  BP: 137/78  Pulse: 92  Temp: 97.7 F (36.5 C)  TempSrc: Oral  Resp: 16  Height: 5\' 3"  (1.6 m)  Weight: 193 lb 14.4 oz (87.952 kg)  SpO2: 94%    Physical Exam  Constitutional: She is well-developed, well-nourished, and in no distress.  Cardiovascular: Normal rate and regular rhythm.   Pulmonary/Chest: Effort  normal and breath sounds normal.  Abdominal: Soft. Bowel sounds are normal.  Nursing note and vitals reviewed.   Assessment & Plan  1. Prediabetes Will obtain lab work for evaluation of possible diabetes. Obtain FLP to check lipids. We will request PCP records and review in detail - Lipid Profile - Comprehensive Metabolic Panel (CMET) - POCT HgB A1C - POCT glucose (manual entry)   Madeline Bebout Asad A. Faylene KurtzShah [facility] 07/26/2015 10:19 AM

## 2015-07-27 LAB — COMPREHENSIVE METABOLIC PANEL
ALBUMIN: 4.3 g/dL (ref 3.6–4.8)
ALT: 12 IU/L (ref 0–32)
AST: 19 IU/L (ref 0–40)
Albumin/Globulin Ratio: 1.7 (ref 1.1–2.5)
Alkaline Phosphatase: 78 IU/L (ref 39–117)
BILIRUBIN TOTAL: 0.5 mg/dL (ref 0.0–1.2)
BUN / CREAT RATIO: 18 (ref 11–26)
BUN: 13 mg/dL (ref 8–27)
CHLORIDE: 97 mmol/L (ref 96–106)
CO2: 28 mmol/L (ref 18–29)
Calcium: 9.4 mg/dL (ref 8.7–10.3)
Creatinine, Ser: 0.72 mg/dL (ref 0.57–1.00)
GFR calc Af Amer: 103 mL/min/{1.73_m2} (ref >59)
GFR calc non Af Amer: 89 mL/min/{1.73_m2} (ref >59)
GLOBULIN, TOTAL: 2.5 g/dL (ref 1.5–4.5)
GLUCOSE: 96 mg/dL (ref 65–99)
Potassium: 4.3 mmol/L (ref 3.5–5.2)
SODIUM: 138 mmol/L (ref 134–144)
Total Protein: 6.8 g/dL (ref 6.0–8.5)

## 2015-07-27 LAB — LIPID PANEL
CHOLESTEROL TOTAL: 199 mg/dL (ref 100–199)
Chol/HDL Ratio: 2.1 ratio units (ref 0.0–4.4)
HDL: 96 mg/dL (ref >39)
LDL CALC: 90 mg/dL (ref 0–99)
Triglycerides: 64 mg/dL (ref 0–149)
VLDL CHOLESTEROL CAL: 13 mg/dL (ref 5–40)

## 2015-08-27 DIAGNOSIS — M4316 Spondylolisthesis, lumbar region: Secondary | ICD-10-CM | POA: Diagnosis not present

## 2015-08-27 DIAGNOSIS — G894 Chronic pain syndrome: Secondary | ICD-10-CM | POA: Diagnosis not present

## 2015-08-27 DIAGNOSIS — M5136 Other intervertebral disc degeneration, lumbar region: Secondary | ICD-10-CM | POA: Diagnosis not present

## 2015-08-27 DIAGNOSIS — M961 Postlaminectomy syndrome, not elsewhere classified: Secondary | ICD-10-CM | POA: Diagnosis not present

## 2015-09-04 ENCOUNTER — Ambulatory Visit (INDEPENDENT_AMBULATORY_CARE_PROVIDER_SITE_OTHER): Payer: Medicare Other | Admitting: Family Medicine

## 2015-09-04 ENCOUNTER — Encounter: Payer: Self-pay | Admitting: Family Medicine

## 2015-09-04 VITALS — BP 118/74 | HR 86 | Temp 98.3°F | Resp 14 | Ht 63.0 in | Wt 195.9 lb

## 2015-09-04 DIAGNOSIS — N3091 Cystitis, unspecified with hematuria: Secondary | ICD-10-CM | POA: Diagnosis not present

## 2015-09-04 DIAGNOSIS — R309 Painful micturition, unspecified: Secondary | ICD-10-CM | POA: Diagnosis not present

## 2015-09-04 DIAGNOSIS — R399 Unspecified symptoms and signs involving the genitourinary system: Secondary | ICD-10-CM | POA: Insufficient documentation

## 2015-09-04 LAB — POCT URINALYSIS DIPSTICK
BILIRUBIN UA: NEGATIVE
Blood, UA: POSITIVE
Glucose, UA: NEGATIVE
KETONES UA: NEGATIVE
NITRITE UA: NEGATIVE
PH UA: 5.5
PROTEIN UA: NEGATIVE
Spec Grav, UA: 1.025
Urobilinogen, UA: 0.2

## 2015-09-04 MED ORDER — CIPROFLOXACIN HCL 500 MG PO TABS: 500.0000 mg | ORAL_TABLET | Freq: Two times a day (BID) | ORAL | Status: DC

## 2015-09-04 NOTE — Patient Instructions (Signed)

## 2015-09-04 NOTE — Progress Notes (Signed)
Name: Christina Ware   MRN: [id number]    DOB: [birth year]   Date:09/04/2015       Progress Note  Subjective  Chief Complaint  Chief Complaint  Patient presents with  . Urinary Tract Infection    onset 2 days worsening, hesitancy, abdominal pressure, frequency    HPI  Patient is here today with concerns regarding the following symptoms dysuria, frequency and suprapubic pressure that started 2-3 days ago.  Associated with fatigue. Not associated with vaginal discharge, blood with urine, fevers, mental status change.    Past Medical History  Diagnosis Date  . Prediabetes   . Chronic back pain greater than 3 months duration     Degenerative Disc Disease, surgery on lower back.  . Chronic neck pain     Ruptured disc in neck    Social History  Substance Use Topics  . Smoking status: Former Games developer  . Smokeless tobacco: Not on file     Comment: quit when she was 65yrs old  . Alcohol Use: No     Current outpatient prescriptions:  .  Acetylcarnitine HCl (ACETYL L-CARNITINE PO), Take 400 mg by mouth., Disp: , Rfl:  .  Alpha-Lipoic Acid 200 MG TABS, Take by mouth., Disp: , Rfl:  .  Black Cohosh ([facility] BLACK COHOSH) 540 MG CAPS, Take by mouth., Disp: , Rfl:  .  Cinnamon 500 MG capsule, Take 500 mg by mouth daily., Disp: , Rfl:  .  docusate sodium (COLACE) 100 MG capsule, Take by mouth., Disp: , Rfl:  .  fentaNYL (DURAGESIC - DOSED MCG/HR) 75 MCG/HR, Place onto the skin., Disp: , Rfl:  .  gabapentin (NEURONTIN) 800 MG tablet, , Disp: , Rfl:  .  L-Arginine 500 MG CAPS, Take by mouth., Disp: , Rfl:  .  meloxicam (MOBIC) 7.5 MG tablet, , Disp: , Rfl:  .  Omega-3 Fatty Acids (FISH OIL) 1000 MG CAPS, Take by mouth., Disp: , Rfl:  .  Oxycodone HCl 10 MG TABS, , Disp: , Rfl:  .  oxyCODONE-acetaminophen (PERCOCET) 10-325 MG tablet, Take 1 tablet by mouth every 4 (four) hours as needed for pain., Disp: , Rfl:  .  phentermine 37.5 MG capsule, Take 37.5 mg by mouth every morning., Disp: ,  Rfl:  .  tiZANidine (ZANAFLEX) 4 MG tablet, Reported on 07/26/2015, Disp: , Rfl:  .  vitamin E 400 UNIT capsule, Take 400 Units by mouth daily., Disp: , Rfl:   Allergies  Allergen Reactions  . Codeine Other (See Comments)  . Hydrocodone   . Latex     ROS  Positive for urinary frequency, dysuria as mentioned in HPI, otherwise all systems reviewed and are negative.  Objective  Filed Vitals:   09/04/15 1335  BP: 118/74  Pulse: 86  Temp: 98.3 F (36.8 C)  TempSrc: Oral  Resp: 14  Height:  (1.6 m)  Weight: 195 lb 14.4 oz (88.86 kg)  SpO2: 94%   Body mass index is 34.71 kg/(m^2).   Physical Exam  Constitutional: Patient appears well-developed and well-nourished. In no acute distress but does appear to be uncomfortable from acute illness. Cardiovascular: Normal rate, regular rhythm and normal heart sounds.  No murmur heard.  Pulmonary/Chest: Effort normal and breath sounds normal. No respiratory distress. Abdomen: Soft with normal bowel sounds, mild tenderness on deep palpation over suprapubic area, no reproducible flank tenderness bilaterally.  Genitourinary: Exam deferred. Skin: Skin is warm and dry. No rash noted. No erythema.  Psychiatric: Patient has a normal  mood and affect. Behavior is normal in office today. Judgment and thought content normal in office today.  Results for orders placed or performed in visit on 09/04/15 (from the past 24 hour(s))  POCT urinalysis dipstick     Status: Abnormal   Collection Time: 09/04/15  1:38 PM  Result Value Ref Range   Color, UA yell    Clarity, UA clear    Glucose, UA neg    Bilirubin, UA neg    Ketones, UA neg    Spec Grav, UA 1.025    Blood, UA positive    pH, UA 5.5    Protein, UA neg    Urobilinogen, UA 0.2    Nitrite, UA neg    Leukocytes, UA moderate (2+) (A) Negative    Assessment & Plan  1. Cystitis with hematuria  Symptoms suggestive of uncomplicated urinary tract infection. Instructed patient on  increasing hydration with water and ways to prevent future UTIs. May use Azo for symptomatic relief if not already doing so but not recommended to be used beyond 2-3 days. May start antibiotic therapy.   The patient has been counseled on the proper use, side effects and potential interactions of the new medication. Patient encouraged to review the side effects and safety profile pamphlet provided with the prescription from the pharmacy as well as request counseling from the pharmacy team as needed.   - POCT urinalysis dipstick - Urine Culture - ciprofloxacin (CIPRO) 500 MG tablet; Take 1 tablet (500 mg total) by mouth 2 (two) times daily.  Dispense: 20 tablet; Refill: 0

## 2015-09-05 DIAGNOSIS — R309 Painful micturition, unspecified: Secondary | ICD-10-CM | POA: Diagnosis not present

## 2015-09-06 LAB — URINE CULTURE

## 2015-09-13 ENCOUNTER — Other Ambulatory Visit: Payer: Self-pay | Admitting: Family Medicine

## 2015-09-14 ENCOUNTER — Telehealth: Payer: Self-pay

## 2015-09-14 ENCOUNTER — Telehealth: Payer: Self-pay | Admitting: Family Medicine

## 2015-09-14 DIAGNOSIS — N3091 Cystitis, unspecified with hematuria: Secondary | ICD-10-CM

## 2015-09-14 MED ORDER — CIPROFLOXACIN HCL 500 MG PO TABS: 500.0000 mg | ORAL_TABLET | Freq: Two times a day (BID) | ORAL | Status: DC

## 2015-09-14 NOTE — Telephone Encounter (Signed)
done

## 2015-09-14 NOTE — Telephone Encounter (Signed)
Next time patient needs refill on Cipro for UTI, she will need to be seen.

## 2015-09-14 NOTE — Telephone Encounter (Signed)
Pt needs ciprofloxin and says that the pharm ( [facility]) sent [place] a fax and the pt needs this medication. Says that she has to have this ever so often.

## 2015-09-26 ENCOUNTER — Encounter: Payer: Self-pay | Admitting: Family Medicine

## 2015-09-26 ENCOUNTER — Ambulatory Visit (INDEPENDENT_AMBULATORY_CARE_PROVIDER_SITE_OTHER): Payer: Medicare Other | Admitting: Family Medicine

## 2015-09-26 VITALS — BP 120/70 | HR 108 | Temp 98.2°F | Resp 20 | Ht 63.0 in | Wt 195.9 lb

## 2015-09-26 DIAGNOSIS — R399 Unspecified symptoms and signs involving the genitourinary system: Secondary | ICD-10-CM

## 2015-09-26 NOTE — Progress Notes (Signed)
Name: Christina Ware   MRN: [id number]    DOB: 10/24/1950   Date:09/26/2015       Progress Note  Subjective  Chief Complaint  Chief Complaint  Patient presents with  . Follow-up    2 mo    Urinary Tract Infection  This is a recurrent problem. The pain is at a severity of 0/10. The patient is experiencing no pain. Associated symptoms include frequency and hesitancy. Pertinent negatives include no chills, nausea or vomiting. She has tried antibiotics (Has taken Ciprofloxacin in the past for symptoms of UTI.) for the symptoms.   last dose of ciprofloxacin finished 2 days ago. Has history of recurrent UTIs, treated in the past with ciprofloxacin by her previous PCP.  Past Medical History  Diagnosis Date  . Prediabetes   . Chronic back pain greater than 3 months duration     Degenerative Disc Disease, surgery on lower back.  . Chronic neck pain     Ruptured disc in neck    Past Surgical History  Procedure Laterality Date  . Back surgery  2005    lower back  . Knee arthroscopy Right   . Foot surgery Bilateral     twice    Family History  Problem Relation Age of Onset  . Diabetes Mother   . Cancer Mother   . Hypertension Mother   . Diabetes Father   . Cancer Father   . Hypertension Father   . Diabetes Sister   . Hypertension Sister   . Diabetes Brother   . Cancer Brother   . Hypertension Brother   . Diabetes Brother   . Birth defects Brother     born with a hole in his heart  . Lung disease Brother     patient needed a lung transplant  . Heart disease Brother     needed a heart transplant    Social History   Social History  . Marital Status: Divorced    Spouse Name: N/A  . Number of Children: N/A  . Years of Education: N/A   Occupational History  . Not on file.   Social History Main Topics  . Smoking status: Former Games developer  . Smokeless tobacco: Not on file     Comment: quit when she was 65yrs old  . Alcohol Use: No  . Drug Use: No  . Sexual  Activity: No   Other Topics Concern  . Not on file   Social History Narrative     Current outpatient prescriptions:  .  Acetylcarnitine HCl (ACETYL L-CARNITINE PO), Take 400 mg by mouth., Disp: , Rfl:  .  Alpha-Lipoic Acid 200 MG TABS, Take by mouth., Disp: , Rfl:  .  Black Cohosh ([facility] BLACK COHOSH) 540 MG CAPS, Take by mouth., Disp: , Rfl:  .  Cinnamon 500 MG capsule, Take 500 mg by mouth daily., Disp: , Rfl:  .  docusate sodium (COLACE) 100 MG capsule, Take by mouth., Disp: , Rfl:  .  fentaNYL (DURAGESIC - DOSED MCG/HR) 75 MCG/HR, Place onto the skin., Disp: , Rfl:  .  gabapentin (NEURONTIN) 800 MG tablet, , Disp: , Rfl:  .  L-Arginine 500 MG CAPS, Take by mouth., Disp: , Rfl:  .  meloxicam (MOBIC) 7.5 MG tablet, , Disp: , Rfl:  .  Omega-3 Fatty Acids (FISH OIL) 1000 MG CAPS, Take by mouth., Disp: , Rfl:  .  Oxycodone HCl 10 MG TABS, , Disp: , Rfl:  .  oxyCODONE-acetaminophen (PERCOCET) 10-325 MG tablet,  Take 1 tablet by mouth every 4 (four) hours as needed for pain., Disp: , Rfl:  .  phentermine 37.5 MG capsule, Take 37.5 mg by mouth every morning., Disp: , Rfl:  .  tiZANidine (ZANAFLEX) 4 MG tablet, Reported on 07/26/2015, Disp: , Rfl:  .  vitamin E 400 UNIT capsule, Take 400 Units by mouth daily., Disp: , Rfl:  .  ciprofloxacin (CIPRO) 500 MG tablet, Take 1 tablet (500 mg total) by mouth 2 (two) times daily. (Patient not taking: Reported on 09/26/2015), Disp: 20 tablet, Rfl: 0  Allergies  Allergen Reactions  . Codeine Other (See Comments)  . Hydrocodone   . Latex      Review of Systems  Constitutional: Negative for fever and chills.  Gastrointestinal: Negative for nausea, vomiting and abdominal pain.  Genitourinary: Positive for dysuria, hesitancy and frequency.   Objective  Filed Vitals:   09/26/15 1344  BP: 120/70  Pulse: 108  Temp: 98.2 F (36.8 C)  TempSrc: Oral  Resp: 20  Height:  (1.6 m)  Weight: 195 lb 14.4 oz (88.86 kg)  SpO2: 97%    Physical  Exam  Constitutional: She is oriented to person, place, and time and well-developed, well-nourished, and in no distress.  Cardiovascular: Normal rate and regular rhythm.   Pulmonary/Chest: Effort normal and breath sounds normal.  Abdominal: Soft. Bowel sounds are normal.  Neurological: She is alert and oriented to person, place, and time.  Nursing note and vitals reviewed.      Assessment & Plan  1. UTI symptoms Obtain urinalysis and culture to evaluate for UTI although I explained to the patient that it is unlikely that we will isolate any offending organism given that she has just finished ciprofloxacin 2 days ago. Recommended that if her symptoms continue, she should return to obtain a renal ultrasound. - Urinalysis, Routine w reflex microscopic - Urine Culture   Christina Ware [facility] 09/26/2015 1:56 PM

## 2015-09-27 DIAGNOSIS — H25813 Combined forms of age-related cataract, bilateral: Secondary | ICD-10-CM | POA: Diagnosis not present

## 2015-09-27 LAB — URINALYSIS, ROUTINE W REFLEX MICROSCOPIC
Bilirubin, UA: NEGATIVE
Glucose, UA: NEGATIVE
KETONES UA: NEGATIVE
LEUKOCYTES UA: NEGATIVE
Nitrite, UA: NEGATIVE
PH UA: 6 (ref 5.0–7.5)
PROTEIN UA: NEGATIVE
RBC, UA: NEGATIVE
SPEC GRAV UA: 1.02 (ref 1.005–1.030)
Urobilinogen, Ur: 0.2 mg/dL (ref 0.2–1.0)

## 2015-09-28 LAB — URINE CULTURE

## 2015-12-11 ENCOUNTER — Telehealth: Payer: Self-pay | Admitting: Family Medicine

## 2015-12-13 DIAGNOSIS — G894 Chronic pain syndrome: Secondary | ICD-10-CM | POA: Diagnosis not present

## 2015-12-13 DIAGNOSIS — M4316 Spondylolisthesis, lumbar region: Secondary | ICD-10-CM | POA: Diagnosis not present

## 2015-12-13 DIAGNOSIS — M961 Postlaminectomy syndrome, not elsewhere classified: Secondary | ICD-10-CM | POA: Diagnosis not present

## 2015-12-13 DIAGNOSIS — M5136 Other intervertebral disc degeneration, lumbar region: Secondary | ICD-10-CM | POA: Diagnosis not present

## 2015-12-14 ENCOUNTER — Encounter: Payer: Self-pay | Admitting: Family Medicine

## 2015-12-14 ENCOUNTER — Ambulatory Visit (INDEPENDENT_AMBULATORY_CARE_PROVIDER_SITE_OTHER): Payer: Medicare Other | Admitting: Family Medicine

## 2015-12-14 VITALS — BP 122/70 | HR 101 | Temp 98.6°F | Resp 14 | Ht 63.0 in | Wt 194.7 lb

## 2015-12-14 DIAGNOSIS — K625 Hemorrhage of anus and rectum: Secondary | ICD-10-CM

## 2015-12-14 NOTE — Progress Notes (Signed)
Name: Christina Ware   MRN: [id number]    DOB: [birth year]   Date:12/14/2015       Progress Note  Subjective  Chief Complaint  Chief Complaint  Patient presents with  . Rectal Bleeding    referral for Colonoscopy    HPI  Rectal Bleeding: Pt. Presents for evaluation of bright red bleeding per rectum, first episode a week ago, noticed a small amount of blood on toilet paper, no pain with the bowel movement. She has also felt that 'something is falling out of me.' Denies any rectal discomfort, constipation,Or melena. Has reported family history of colon cancer.   Past Medical History  Diagnosis Date  . Prediabetes   . Chronic back pain greater than 3 months duration     Degenerative Disc Disease, surgery on lower back.  . Chronic neck pain     Ruptured disc in neck    Past Surgical History  Procedure Laterality Date  . Back surgery  2005    lower back  . Knee arthroscopy Right   . Foot surgery Bilateral     twice    Family History  Problem Relation Age of Onset  . Diabetes Mother   . Cancer Mother   . Hypertension Mother   . Diabetes Father   . Cancer Father   . Hypertension Father   . Diabetes Sister   . Hypertension Sister   . Diabetes Brother   . Cancer Brother   . Hypertension Brother   . Diabetes Brother   . Birth defects Brother     born with a hole in his heart  . Lung disease Brother     patient needed a lung transplant  . Heart disease Brother     needed a heart transplant    Social History   Social History  . Marital Status: Divorced    Spouse Name: N/A  . Number of Children: N/A  . Years of Education: N/A   Occupational History  . Not on file.   Social History Main Topics  . Smoking status: Former Games developer  . Smokeless tobacco: Not on file     Comment: quit when she was 65yrs old  . Alcohol Use: No  . Drug Use: No  . Sexual Activity: No   Other Topics Concern  . Not on file   Social History Narrative     Current outpatient  prescriptions:  .  Acetylcarnitine HCl (ACETYL L-CARNITINE PO), Take 400 mg by mouth., Disp: , Rfl:  .  Alpha-Lipoic Acid 200 MG TABS, Take by mouth., Disp: , Rfl:  .  Black Cohosh ([facility] BLACK COHOSH) 540 MG CAPS, Take by mouth., Disp: , Rfl:  .  Cinnamon 500 MG capsule, Take 500 mg by mouth daily., Disp: , Rfl:  .  ciprofloxacin (CIPRO) 500 MG tablet, Take 1 tablet (500 mg total) by mouth 2 (two) times daily. (Patient not taking: Reported on 09/26/2015), Disp: 20 tablet, Rfl: 0 .  docusate sodium (COLACE) 100 MG capsule, Take by mouth., Disp: , Rfl:  .  fentaNYL (DURAGESIC - DOSED MCG/HR) 75 MCG/HR, Place onto the skin., Disp: , Rfl:  .  gabapentin (NEURONTIN) 800 MG tablet, , Disp: , Rfl:  .  L-Arginine 500 MG CAPS, Take by mouth., Disp: , Rfl:  .  meloxicam (MOBIC) 7.5 MG tablet, , Disp: , Rfl:  .  Omega-3 Fatty Acids (FISH OIL) 1000 MG CAPS, Take by mouth., Disp: , Rfl:  .  Oxycodone HCl 10 MG TABS, ,  Disp: , Rfl:  .  oxyCODONE-acetaminophen (PERCOCET) 10-325 MG tablet, Take 1 tablet by mouth every 4 (four) hours as needed for pain., Disp: , Rfl:  .  phentermine 37.5 MG capsule, Take 37.5 mg by mouth every morning., Disp: , Rfl:  .  tiZANidine (ZANAFLEX) 4 MG tablet, Reported on 07/26/2015, Disp: , Rfl:  .  vitamin E 400 UNIT capsule, Take 400 Units by mouth daily., Disp: , Rfl:   Allergies  Allergen Reactions  . Codeine Other (See Comments)  . Hydrocodone   . Latex      Review of Systems  Constitutional: Negative for fever, chills, weight loss and malaise/fatigue.  Gastrointestinal: Positive for blood in stool. Negative for nausea, vomiting, abdominal pain, diarrhea and melena.    Objective  Filed Vitals:   12/14/15 1015  BP: 122/70  Pulse: 101  Temp: 98.6 F (37 C)  TempSrc: Oral  Resp: 14  Height: 5\' 3"  (1.6 m)  Weight: 194 lb 11.2 oz (88.315 kg)  SpO2: 93%    Physical Exam  Constitutional: She is oriented to person, place, and time and well-developed,  well-nourished, and in no distress.  HENT:  Head: Normocephalic and atraumatic.  Cardiovascular: Normal rate and regular rhythm.   Pulmonary/Chest: Effort normal and breath sounds normal.  Abdominal: Soft. Bowel sounds are normal.  Genitourinary: Rectal exam shows mass. Rectal exam shows no external hemorrhoid, no internal hemorrhoid, no fissure and no tenderness.  Small non tender mass at the anterior rectal edge, likely c/w skin tag No stool in rectal vault to check for Hemoccult.  Neurological: She is alert and oriented to person, place, and time.  Nursing note and vitals reviewed.    Assessment & Plan  1. Bright red rectal bleeding No external hemorrhoids or internal hemorrhoids based on digital rectal exam. No appreciable stool to obtain Hemoccult. We will refer to gastroenterology to obtain colonoscopy. - Ambulatory referral to Gastroenterology   Kathi LudwigSyed Asad A. Faylene KurtzShah [facility] 12/14/2015 10:25 AM

## 2015-12-27 ENCOUNTER — Telehealth: Payer: Self-pay | Admitting: Family Medicine

## 2015-12-27 NOTE — Telephone Encounter (Signed)
Pt is calling about the status of her referral for a colonscopy or another typeof test .  Pt said that they talked about this at her appt MAY 5 . Please call and give status.

## 2016-01-04 ENCOUNTER — Telehealth: Payer: Self-pay | Admitting: Family Medicine

## 2016-01-04 NOTE — Telephone Encounter (Signed)
PT SAID THAT SHE STILL HAS NOT HEARD ANYTHING ABOUT HER REFERRAL FOR A COLONOSCOPY . SAID THAT IT HAS BEEN SINCE MAY 5 . PLEASE ADVISE

## 2016-01-10 NOTE — Telephone Encounter (Signed)
ERRENOUS °

## 2016-01-28 ENCOUNTER — Ambulatory Visit (INDEPENDENT_AMBULATORY_CARE_PROVIDER_SITE_OTHER): Payer: Medicare Other | Admitting: Urology

## 2016-01-28 VITALS — BP 135/89 | HR 96 | Ht 63.0 in | Wt 190.0 lb

## 2016-01-28 DIAGNOSIS — R32 Unspecified urinary incontinence: Secondary | ICD-10-CM | POA: Diagnosis not present

## 2016-01-28 LAB — URINALYSIS, COMPLETE
BILIRUBIN UA: NEGATIVE
GLUCOSE, UA: NEGATIVE
NITRITE UA: NEGATIVE
Protein, UA: NEGATIVE
RBC UA: NEGATIVE
SPEC GRAV UA: 1.01 (ref 1.005–1.030)
UUROB: 0.2 mg/dL (ref 0.2–1.0)
pH, UA: 7 (ref 5.0–7.5)

## 2016-01-28 LAB — BLADDER SCAN AMB NON-IMAGING

## 2016-01-28 LAB — MICROSCOPIC EXAMINATION: Epithelial Cells (non renal): >10 /hpf — AB (ref 0–10)

## 2016-01-28 NOTE — Progress Notes (Signed)
01/28/2016 3:41 PM   Christina Ware Chrisman 05-Feb-1951 [id number]  Referring provider: Ellyn Hack, MD [address] [place], [place] [address]  Chief Complaint  Patient presents with  . Urinary Incontinence    HPI: The patient describes an InterStim placed by Dr. Edwyna Shell a number of years ago for severe frequency. She said she was not emptying well. Sometimes the details of her history difficult to ascertain  Prior to a few months ago. She was voiding approximately every 1-2 hours getting up twice at night but flow. She's not had a sling and she's had low back surgery. She does not get a lot of urinary tract infection  Over a few months she said she could not urinate well. We feel like she could explore but could not go. A few days ago she finally turned the InterStim device off and she said she voided much better. Currently her flow was good or poor. She voids 3-4 times an hour and getting up at least 8-10 times a night  On further review of the medical records from October 2015 in noted the device was placed October 2014. Her residual on that day was 94 mL. She had been taught how to self catheterize in the past. It noted that the device was placed for retention. Having said that she had a lot of frequency and nighttime frequency and incontinence than as well.  Modifying factors: There are no other modifying factors  Associated signs and symptoms: There are no other associated signs and symptoms Aggravating and relieving factors: There are no other aggravating or relieving factors Severity: Moderate Duration: Persistent      PMH: Past Medical History  Diagnosis Date  . Prediabetes   . Chronic back pain greater than 3 months duration     Degenerative Disc Disease, surgery on lower back.  . Chronic neck pain     Ruptured disc in neck    Surgical History: Past Surgical History  Procedure Laterality Date  . Back surgery  2005    lower back  . Knee arthroscopy  Right   . Foot surgery Bilateral     twice    Home Medications:    Medication List       This list is accurate as of: 01/28/16  3:41 PM.  Always use your most recent med list.               ACETYL L-CARNITINE PO  Take 400 mg by mouth.     Alpha-Lipoic Acid 200 MG Tabs  Take by mouth.     Cinnamon 500 MG capsule  Take 500 mg by mouth daily.     [employer]S BLACK COHOSH 540 MG Caps  Generic drug:  Black Cohosh  Take by mouth.     docusate sodium 100 MG capsule  Commonly known as:  COLACE  Take by mouth.     fentaNYL 75 MCG/HR  Commonly known as:  DURAGESIC - dosed mcg/hr  Place onto the skin.     Fish Oil 1000 MG Caps  Take by mouth.     gabapentin 800 MG tablet  Commonly known as:  NEURONTIN     L-Arginine 500 MG Caps  Take by mouth.     Oxycodone HCl 10 MG Tabs     oxyCODONE-acetaminophen 10-325 MG tablet  Commonly known as:  PERCOCET  Take 1 tablet by mouth every 4 (four) hours as needed for pain.     phentermine 37.5 MG capsule  Take 37.5 mg by mouth every morning.     tiZANidine 4 MG tablet  Commonly known as:  ZANAFLEX  Reported on 07/26/2015     vitamin E 400 UNIT capsule  Take 400 Units by mouth daily.        Allergies:  Allergies  Allergen Reactions  . Codeine Other (See Comments)  . Hydrocodone   . Latex     Family History: Family History  Problem Relation Age of Onset  . Diabetes Mother   . Cancer Mother   . Hypertension Mother   . Diabetes Father   . Cancer Father   . Hypertension Father   . Diabetes Sister   . Hypertension Sister   . Diabetes Brother   . Cancer Brother   . Hypertension Brother   . Diabetes Brother   . Birth defects Brother     born with a hole in his heart  . Lung disease Brother     patient needed a lung transplant  . Heart disease Brother     needed a heart transplant    Social History:  reports that she has quit smoking. She does not have any smokeless tobacco history on file. She reports that she  does not drink alcohol or use illicit drugs.  ROS: UROLOGY Frequent Urination?: Yes Hard to postpone urination?: Yes Burning/pain with urination?: No Get up at night to urinate?: Yes Leakage of urine?: No Urine stream starts and stops?: No Trouble starting stream?: Yes Do you have to strain to urinate?: Yes Blood in urine?: No Urinary tract infection?: No Sexually transmitted disease?: No Injury to kidneys or bladder?: No Painful intercourse?: No Weak stream?: No Currently pregnant?: No Vaginal bleeding?: No Last menstrual period?: n  Gastrointestinal Nausea?: No Vomiting?: No Indigestion/heartburn?: No Diarrhea?: No Constipation?: No  Constitutional Fever: No Night sweats?: No Weight loss?: No Fatigue?: No  Skin Skin rash/lesions?: No Itching?: No  Eyes Blurred vision?: No Double vision?: No  Ears/Nose/Throat Sore throat?: No Sinus problems?: No  Hematologic/Lymphatic Swollen glands?: No Easy bruising?: No  Cardiovascular Leg swelling?: No Chest pain?: No  Respiratory Cough?: No Shortness of breath?: No  Endocrine Excessive thirst?: Yes  Musculoskeletal Back pain?: Yes Joint pain?: No  Neurological Headaches?: No Dizziness?: No  Psychologic Depression?: Yes Anxiety?: No  Physical Exam: BP 135/89 mmHg  Pulse 96  Ht 5\' 3"  (1.6 m)  Wt 190 lb (86.183 kg)  BMI 33.67 kg/m2  Constitutional:  Alert and oriented, No acute distress. HEENT: [place] AT, moist mucus membranes.  Trachea midline, no masses. Cardiovascular: No clubbing, cyanosis, or edema. Respiratory: Normal respiratory effort, no increased work of breathing. GI: Abdomen is soft, nontender, nondistended, no abdominal masses GU: No CVA tenderness. Grade 2 cystocele that was not symptomatic. No stress incontinence. No hinge effect Skin: No rashes, bruises or suspicious lesions. Lymph: No cervical or inguinal adenopathy. Neurologic: Grossly intact, no focal deficits, moving all 4  extremities. Psychiatric: Normal mood and affect.  Laboratory Data: Lab Results  Component Value Date   WBC 15.5* 05/14/2012   HGB 13.8 05/14/2012   HCT 42.0 05/14/2012   MCV 91 05/14/2012   PLT 246 05/14/2012    Lab Results  Component Value Date   CREATININE 0.72 07/26/2015    No results found for: PSA  No results found for: TESTOSTERONE  Lab Results  Component Value Date   HGBA1C 6.0 07/26/2015    Urinalysis    Component Value Date/Time   COLORURINE Yellow 05/14/2012 1702  COLORURINE YELLOW 05/17/2009 1634   APPEARANCEUR Clear 09/26/2015 0000   APPEARANCEUR Clear 05/14/2012 1702   APPEARANCEUR CLEAR 05/17/2009 1634   LABSPEC 1.020 05/14/2012 1702   LABSPEC 1.010 05/17/2009 1634   PHURINE 9.0 05/14/2012 1702   PHURINE 7.0 05/17/2009 1634   GLUCOSEU Negative 09/26/2015 0000   GLUCOSEU Negative 05/14/2012 1702   HGBUR Negative 05/14/2012 1702   HGBUR NEGATIVE 05/17/2009 1634   BILIRUBINUR Negative 09/26/2015 0000   BILIRUBINUR neg 09/04/2015 1338   BILIRUBINUR Negative 05/14/2012 1702   BILIRUBINUR NEGATIVE 05/17/2009 1634   KETONESUR Negative 05/14/2012 1702   KETONESUR NEGATIVE 05/17/2009 1634   PROTEINUR Negative 09/26/2015 0000   PROTEINUR neg 09/04/2015 1338   PROTEINUR Negative 05/14/2012 1702   PROTEINUR NEGATIVE 05/17/2009 1634   UROBILINOGEN 0.2 09/04/2015 1338   UROBILINOGEN 0.2 05/17/2009 1634   NITRITE Negative 09/26/2015 0000   NITRITE neg 09/04/2015 1338   NITRITE Negative 05/14/2012 1702   NITRITE NEGATIVE 05/17/2009 1634   LEUKOCYTESUR Negative 09/26/2015 0000   LEUKOCYTESUR moderate (2+)* 09/04/2015 1338   LEUKOCYTESUR Negative 05/14/2012 1702    Pertinent Imaging: None  Assessment & Plan: The patient has flow symptoms and severe frequency and nighttime frequency. She had flow symptoms and cannot urinate or worsening or a few months and she turned the device off. She actually was doing reasonably well prior to this. The patient was  scanned today for 93 mL. She was sitting on the toilet for a few hours and finally catheterized at home and describes very little volume.  I will have the nurses checked the medical records to see why the device was placed in October 2014 i.e. for retention versus incontinence and with the residuals were prior. I will call of urine cultures positive. I will give a trial of Rapaflo samples. We will troubleshoot the device with the [employer] representative  The patient's symptoms are a little bit difficult to sort out. She appears to be scribing bladder overactivity with the associated feeling of retention and incomplete bladder emptying and the patient predisposed to this symptom. I urinary tract infection would Splane the symptoms  The nurses looked into her notes and appears at the residuals are always less than 100 mL and she did void up to 40 times a day. This supports an overactive bladder diagnosis and possibly now a malfunctioning InterStim  3 weeks of alpha-blocker given and see [employer] rep  She is going to reactivate the device and see if it helps  1. Urinary incontinence, unspecified incontinence type   Martina Sinner, MD  [facility] [address] [place], [place] [address] [phone]

## 2016-01-30 LAB — CULTURE, URINE COMPREHENSIVE

## 2016-02-21 ENCOUNTER — Encounter: Payer: Self-pay | Admitting: Family Medicine

## 2016-02-25 ENCOUNTER — Ambulatory Visit (INDEPENDENT_AMBULATORY_CARE_PROVIDER_SITE_OTHER): Payer: Medicare Other | Admitting: Urology

## 2016-02-25 ENCOUNTER — Encounter: Payer: Self-pay | Admitting: Urology

## 2016-02-25 VITALS — BP 111/74 | HR 76 | Ht 63.0 in | Wt 193.0 lb

## 2016-02-25 DIAGNOSIS — N3946 Mixed incontinence: Secondary | ICD-10-CM | POA: Diagnosis not present

## 2016-02-25 NOTE — Progress Notes (Signed)
02/25/2016 2:38 PM   Christina Ware Dec 25, 1950 [id number]  Referring provider: Ellyn Hack, MD [address] [place], [place] [address]  Chief Complaint  Patient presents with  . Follow-up    difficulty urinating     HPI: The patient describes an InterStim placed by Dr. Edwyna Shell a number of years ago for severe frequency. She said she was not emptying well. Sometimes the details of her history difficult to ascertain  Prior to a few months ago. She was voiding approximately every 1-2 hours getting up twice at night but flow. She's not had a sling and she's had low back surgery. She does not get a lot of urinary tract infection  Over a few months she said she could not urinate well. We feel like she could explore but could not go. A few days ago she finally turned the InterStim device off and she said she voided much better. Currently her flow was good or poor. She voids 3-4 times an hour and getting up at least 8-10 times a night  On further review of the medical records from October 2015 in noted the device was placed October 2014. Her residual on that day was 94 mL. She had been taught how to self catheterize in the past. It noted that the device was placed for retention. Having said that she had a lot of frequency and nighttime frequency and incontinence than as well.    The patient has flow symptoms and severe frequency and nighttime frequency. She had flow symptoms and cannot urinate or worsening or a few months and she turned the device off. She actually was doing reasonably well prior to this. The patient was scanned today for 93 mL. She was sitting on the toilet for a few hours and finally catheterized at home and describes very little volume.  I will have the nurses checked the medical records to see why the device was placed in October 2014 i.e. for retention versus incontinence and with the residuals were prior. I will call of urine cultures positive. I will  give a trial of Rapaflo samples. We will troubleshoot the device with the [employer] representative  The patient's symptoms are a little bit difficult to sort out. She appears to be scribing bladder overactivity with the associated feeling of retention and incomplete bladder emptying and the patient predisposed to this symptom.   The nurses looked into her notes and appears at the residuals are always less than 100 mL and she did void up to 40 times a day. This supports an overactive bladder diagnosis and possibly now a malfunctioning InterStim  3 weeks of alpha-blocker given and see [employer] rep  She is going to reactivate the device and see if it helps   PMH: Past Medical History  Diagnosis Date  . Prediabetes   . Chronic back pain greater than 3 months duration     Degenerative Disc Disease, surgery on lower back.  . Chronic neck pain     Ruptured disc in neck    Surgical History: Past Surgical History  Procedure Laterality Date  . Back surgery  2005    lower back  . Knee arthroscopy Right   . Foot surgery Bilateral     twice    Home Medications:    Medication List       This list is accurate as of: 02/25/16  2:38 PM.  Always use your most recent med list.  ACETYL L-CARNITINE PO  Take 400 mg by mouth.     Alpha-Lipoic Acid 200 MG Tabs  Take by mouth.     Cinnamon 500 MG capsule  Take 500 mg by mouth daily.     CVS BLACK COHOSH 540 MG Caps  Generic drug:  Black Cohosh  Take by mouth.     docusate sodium 100 MG capsule  Commonly known as:  COLACE  Take by mouth.     fentaNYL 75 MCG/HR  Commonly known as:  DURAGESIC - dosed mcg/hr  Place onto the skin.     Fish Oil 1000 MG Caps  Take by mouth.     gabapentin 800 MG tablet  Commonly known as:  NEURONTIN     L-Arginine 500 MG Caps  Take by mouth.     Oxycodone HCl 10 MG Tabs     tiZANidine 4 MG tablet  Commonly known as:  ZANAFLEX  Reported on 02/25/2016     vitamin E 400 UNIT  capsule  Take 400 Units by mouth daily. Reported on 02/25/2016        Allergies:  Allergies  Allergen Reactions  . Codeine Other (See Comments)  . Hydrocodone   . Latex     Family History: Family History  Problem Relation Age of Onset  . Diabetes Mother   . Cancer Mother   . Hypertension Mother   . Diabetes Father   . Cancer Father   . Hypertension Father   . Diabetes Sister   . Hypertension Sister   . Diabetes Brother   . Cancer Brother   . Hypertension Brother   . Diabetes Brother   . Birth defects Brother     born with a hole in his heart  . Lung disease Brother     patient needed a lung transplant  . Heart disease Brother     needed a heart transplant    Social History:  reports that she has quit smoking. She does not have any smokeless tobacco history on file. She reports that she does not drink alcohol or use illicit drugs.  ROS: UROLOGY Frequent Urination?: Yes Hard to postpone urination?: No Burning/pain with urination?: No Get up at night to urinate?: Yes Leakage of urine?: No Urine stream starts and stops?: Yes Trouble starting stream?: No Do you have to strain to urinate?: Yes Blood in urine?: No Urinary tract infection?: No Sexually transmitted disease?: No Injury to kidneys or bladder?: No Painful intercourse?: No Weak stream?: Yes Currently pregnant?: No Vaginal bleeding?: No Last menstrual period?: No  Gastrointestinal Nausea?: No Vomiting?: No Indigestion/heartburn?: No Diarrhea?: No Constipation?: No  Constitutional Fever: No Night sweats?: Yes Weight loss?: No Fatigue?: No  Skin Skin rash/lesions?: No Itching?: No  Eyes Blurred vision?: No Double vision?: No  Ears/Nose/Throat Sore throat?: No Sinus problems?: No  Hematologic/Lymphatic Swollen glands?: No Easy bruising?: No  Cardiovascular Leg swelling?: No Chest pain?: No  Respiratory Cough?: No Shortness of breath?: No  Endocrine Excessive thirst?:  No  Musculoskeletal Back pain?: No Joint pain?: No  Neurological Headaches?: No Dizziness?: No  Psychologic Depression?: No Anxiety?: No  Physical Exam: BP 111/74 mmHg  Pulse 76  Ht 5\' 3"  (1.6 m)  Wt 193 lb (87.544 kg)  BMI 34.20 kg/m2    Laboratory Data: Lab Results  Component Value Date   WBC 15.5* 05/14/2012   HGB 13.8 05/14/2012   HCT 42.0 05/14/2012   MCV 91 05/14/2012   PLT 246 05/14/2012    Lab  Results  Component Value Date   CREATININE 0.72 07/26/2015    No results found for: PSA  No results found for: TESTOSTERONE  Lab Results  Component Value Date   HGBA1C 6.0 07/26/2015    Urinalysis    Component Value Date/Time   COLORURINE Yellow 05/14/2012 1702   COLORURINE YELLOW 05/17/2009 1634   APPEARANCEUR Hazy* 01/28/2016 1510   APPEARANCEUR Clear 05/14/2012 1702   APPEARANCEUR CLEAR 05/17/2009 1634   LABSPEC 1.020 05/14/2012 1702   LABSPEC 1.010 05/17/2009 1634   PHURINE 9.0 05/14/2012 1702   PHURINE 7.0 05/17/2009 1634   GLUCOSEU Negative 01/28/2016 1510   GLUCOSEU Negative 05/14/2012 1702   HGBUR Negative 05/14/2012 1702   HGBUR NEGATIVE 05/17/2009 1634   BILIRUBINUR Negative 01/28/2016 1510   BILIRUBINUR neg 09/04/2015 1338   BILIRUBINUR Negative 05/14/2012 1702   BILIRUBINUR NEGATIVE 05/17/2009 1634   KETONESUR Negative 05/14/2012 1702   KETONESUR NEGATIVE 05/17/2009 1634   PROTEINUR Negative 01/28/2016 1510   PROTEINUR neg 09/04/2015 1338   PROTEINUR Negative 05/14/2012 1702   PROTEINUR NEGATIVE 05/17/2009 1634   UROBILINOGEN 0.2 09/04/2015 1338   UROBILINOGEN 0.2 05/17/2009 1634   NITRITE Negative 01/28/2016 1510   NITRITE neg 09/04/2015 1338   NITRITE Negative 05/14/2012 1702   NITRITE NEGATIVE 05/17/2009 1634   LEUKOCYTESUR 1+* 01/28/2016 1510   LEUKOCYTESUR moderate (2+)* 09/04/2015 1338   LEUKOCYTESUR Negative 05/14/2012 1702    Pertinent Imaging: none  Assessment & Plan:  The patient's impedance was abnormal in the  least 2 leads. The [employer] representative reprogram the device around these positions We will reassess the patient 8 weeks. If the settings are reach the patient's treatment goal one could consider replacement  There are no diagnoses linked to this encounter.  Return in about 2 months (around 04/27/2016).  Martina Sinner, MD  [facility] [address], [address] [place], [place] [address] [phone]

## 2016-04-25 ENCOUNTER — Ambulatory Visit: Payer: Medicare Other

## 2016-04-30 DIAGNOSIS — Z79891 Long term (current) use of opiate analgesic: Secondary | ICD-10-CM | POA: Diagnosis not present

## 2016-04-30 DIAGNOSIS — M5136 Other intervertebral disc degeneration, lumbar region: Secondary | ICD-10-CM | POA: Diagnosis not present

## 2016-04-30 DIAGNOSIS — M961 Postlaminectomy syndrome, not elsewhere classified: Secondary | ICD-10-CM | POA: Diagnosis not present

## 2016-04-30 DIAGNOSIS — G894 Chronic pain syndrome: Secondary | ICD-10-CM | POA: Diagnosis not present

## 2016-06-02 ENCOUNTER — Telehealth: Payer: Self-pay | Admitting: Urology

## 2016-06-02 ENCOUNTER — Ambulatory Visit (INDEPENDENT_AMBULATORY_CARE_PROVIDER_SITE_OTHER): Payer: Medicare Other | Admitting: Family Medicine

## 2016-06-02 VITALS — BP 109/70 | HR 88 | Temp 98.0°F | Ht 62.5 in | Wt 199.4 lb

## 2016-06-02 DIAGNOSIS — Z0000 Encounter for general adult medical examination without abnormal findings: Secondary | ICD-10-CM | POA: Diagnosis not present

## 2016-06-02 NOTE — Telephone Encounter (Signed)
Pt stopped by and said [facility] [phone] wants to know if she can put a pain blocker in.  Dr possibly wanted the same company, [employer], to possibly do this.  Please give a pt a call [phone]

## 2016-06-02 NOTE — Patient Instructions (Signed)
[name] , Thank you for taking time to come for your Medicare Wellness Visit. I appreciate your ongoing commitment to your health goals. Please review the following plan we discussed and let me know if I can assist you in the future.   These are the goals we discussed: Goals    . Water intake          Starting 06/02/2016, I will continue to drink 6-8 glasses of water a day.       This is a list of the screening recommended for you and due dates:  Health Maintenance  Topic Date Due  . Pap Smear  08/10/2016*  .  Hepatitis C: One time screening is recommended by Center for Disease Control  (CDC) for  adults born from [birth year] through 1964.   08/10/2016*  . Colon Cancer Screening  06/02/2026*  . Tetanus Vaccine  06/02/2026*  . HIV Screening  06/02/2026*  . Mammogram  05/11/2017  . Flu Shot  Completed  . Shingles Vaccine  Completed  *Topic was postponed. The date shown is not the original due date.   Preventive Care for Adults  A healthy lifestyle and preventive care can promote health and wellness. Preventive health guidelines for adults include the following key practices.  . A routine yearly physical is a good way to check with your health care provider about your health and preventive screening. It is a chance to share any concerns and updates on your health and to receive a thorough exam.  . Visit your dentist for a routine exam and preventive care every 6 months. Brush your teeth twice a day and floss once a day. Good oral hygiene prevents tooth decay and gum disease.  . The frequency of eye exams is based on your age, health, family medical history, use  of contact lenses, and other factors. Follow your health care provider's ecommendations for frequency of eye exams.  . Eat a healthy diet. Foods like vegetables, fruits, whole grains, low-fat dairy products, and lean protein foods contain the nutrients you need without too many calories. Decrease your intake of foods high in  solid fats, added sugars, and salt. Eat the right amount of calories for you. Get information about a proper diet from your health care provider, if necessary.  . Regular physical exercise is one of the most important things you can do for your health. Most adults should get at least 150 minutes of moderate-intensity exercise (any activity that increases your heart rate and causes you to sweat) each week. In addition, most adults need muscle-strengthening exercises on 2 or more days a week.  Silver Sneakers may be a benefit available to you. To determine eligibility, you may visit the website: www.silversneakers.com or contact program at [phone] Mon-Fri between 8AM-8PM.   . Maintain a healthy weight. The body mass index (BMI) is a screening tool to identify possible weight problems. It provides an estimate of body fat based on height and weight. Your health care provider can find your BMI and can help you achieve or maintain a healthy weight.   For adults 20 years and older: ? A BMI below 18.5 is considered underweight. ? A BMI of 18.5 to 24.9 is normal. ? A BMI of 25 to 29.9 is considered overweight. ? A BMI of 30 and above is considered obese.   . Maintain normal blood lipids and cholesterol levels by exercising and minimizing your intake of saturated fat. Eat a balanced diet with plenty of fruit and  vegetables. Blood tests for lipids and cholesterol should begin at age 34 and be repeated every 5 years. If your lipid or cholesterol levels are high, you are over 50, or you are at high risk for heart disease, you may need your cholesterol levels checked more frequently. Ongoing high lipid and cholesterol levels should be treated with medicines if diet and exercise are not working.  . If you smoke, find out from your health care provider how to quit. If you do not use tobacco, please do not start.  . If you choose to drink alcohol, please do not consume more than 2 drinks per day. One drink  is considered to be 12 ounces (355 mL) of beer, 5 ounces (148 mL) of wine, or 1.5 ounces (44 mL) of liquor.  . If you are 55-46 years old, ask your health care provider if you should take aspirin to prevent strokes.  . Use sunscreen. Apply sunscreen liberally and repeatedly throughout the day. You should seek shade when your shadow is shorter than you. Protect yourself by wearing long sleeves, pants, a wide-brimmed hat, and sunglasses year round, whenever you are outdoors.  . Once a month, do a whole body skin exam, using a mirror to look at the skin on your back. Tell your health care provider of new moles, moles that have irregular borders, moles that are larger than a pencil eraser, or moles that have changed in shape or color.

## 2016-06-02 NOTE — Progress Notes (Signed)
Subjective:   Christina ResidesCarolyn S Ware is a 65 y.o. female who presents for Medicare Annual (Subsequent) preventive examination.  Review of Systems:  N/A Cardiac Risk Factors include: obesity (BMI >30kg/m2)     Objective:     Vitals: BP 109/70 (BP Location: Left Arm)   Pulse 88   Temp 98 F (36.7 C)   Ht 5' 2.5" (1.588 m)   Wt 199 lb 6 oz (90.4 kg)   BMI 35.89 kg/m   Body mass index is 35.89 kg/m.   Tobacco History  Smoking Status  . Former Smoker  Smokeless Tobacco  . Never Used    Comment: quit when she was [age]yrs old     Counseling given: No   Past Medical History:  Diagnosis Date  . Chronic back pain greater than 3 months duration    Degenerative Disc Disease, surgery on lower back.  . Chronic neck pain    Ruptured disc in neck  . Prediabetes    Past Surgical History:  Procedure Laterality Date  . BACK SURGERY  2005   lower back  . FOOT SURGERY Bilateral    twice  . KNEE ARTHROSCOPY Right    Family History  Problem Relation Age of Onset  . Diabetes Mother   . Cancer Mother   . Hypertension Mother   . Diabetes Father   . Cancer Father   . Hypertension Father   . Diabetes Sister   . Hypertension Sister   . Diabetes Brother   . Cancer Brother   . Hypertension Brother   . Diabetes Brother   . Birth defects Brother     born with a hole in his heart  . Lung disease Brother     patient needed a lung transplant  . Heart disease Brother     needed a heart transplant   History  Sexual Activity  . Sexual activity: No    Outpatient Encounter Prescriptions as of 06/02/2016  Medication Sig  . Acetylcarnitine HCl (ACETYL L-CARNITINE PO) Take 400 mg by mouth.  . Alpha-Lipoic Acid 200 MG TABS Take by mouth.  . Black Cohosh ([employer] BLACK COHOSH) 540 MG CAPS Take by mouth.  . docusate sodium (COLACE) 100 MG capsule Take by mouth.  . fentaNYL (DURAGESIC - DOSED MCG/HR) 75 MCG/HR Place onto the skin.  Marland Kitchen. gabapentin (NEURONTIN) 800 MG tablet   . Omega-3  Fatty Acids (FISH OIL) 1000 MG CAPS Take by mouth.  . Oxycodone HCl 10 MG TABS   . tiZANidine (ZANAFLEX) 4 MG tablet Reported on 02/25/2016  . Cinnamon 500 MG capsule Take 500 mg by mouth daily.  Marland Kitchen. L-Arginine 500 MG CAPS Take by mouth.  . vitamin E 400 UNIT capsule Take 400 Units by mouth daily. Reported on 02/25/2016   No facility-administered encounter medications on file as of 06/02/2016.     Activities of Daily Living In your present state of health, do you have any difficulty performing the following activities: 06/02/2016 12/14/2015  Hearing? N N  Vision? N N  Difficulty concentrating or making decisions? Y N  Walking or climbing stairs? Y N  Dressing or bathing? N N  Doing errands, shopping? N N  Preparing Food and eating ? N -  Using the Toilet? N -  In the past six months, have you accidently leaked urine? N -  Do you have problems with loss of bowel control? N -  Managing your Medications? N -  Managing your Finances? N -  Housekeeping or managing  your Housekeeping? N -  Some recent data might be hidden    Patient Care Team: Ellyn Hack, MD as PCP - General (Family Medicine) Vedia Pereyra. Alvester Morin, MD as Consulting Physician (Ophthalmology) Sheran Luz, MD as Consulting Physician (Physical Medicine and Rehabilitation)    Assessment:     Exercise Activities and Dietary recommendations Current Exercise Habits: Home exercise routine, Type of exercise: stretching;Other - see comments (bicycling), Time (Minutes): 25, Frequency (Times/Week): 7, Weekly Exercise (Minutes/Week): 175, Intensity: Mild  Goals    . Water intake          Starting 06/02/2016, I will continue to drink 6-8 glasses of water a day.      Fall Risk Fall Risk  06/02/2016 12/14/2015 09/26/2015 07/26/2015  Falls in the past year? No No No No   Depression Screen PHQ 2/9 Scores 06/02/2016 12/14/2015 09/26/2015 07/26/2015  PHQ - 2 Score 1 0 0 0     Cognitive Function     6CIT Screen 06/02/2016  What  Year? 0 points  What month? 0 points  What time? 0 points  Count back from 20 0 points  Months in reverse 0 points  Repeat phrase 2 points  Total Score 2    Immunization History  Administered Date(s) Administered  . Influenza-Unspecified 05/15/2015   Screening Tests Health Maintenance  Topic Date Due  . PAP SMEAR  08/10/2016 (Originally 02/15/1973)  . Hepatitis C Screening  08/10/2016 (Originally 01-Apr-1951)  . COLONOSCOPY  06/02/2026 (Originally 02/15/2002)  . TETANUS/TDAP  06/02/2026 (Originally 02/16/1971)  . HIV Screening  06/02/2026 (Originally 02/16/1967)  . MAMMOGRAM  05/11/2017  . INFLUENZA VACCINE  Completed  . ZOSTAVAX  Completed      Plan:    I have personally reviewed and addressed the Medicare Annual Wellness questionnaire and have noted the following in the patient's chart:  A. Medical and social history B. Use of alcohol, tobacco or illicit drugs  C. Current medications and supplements D. Functional ability and status E.  Nutritional status F.  Physical activity G. Advance directives H. List of other physicians I.  Hospitalizations, surgeries, and ER visits in previous 12 months J.  Vitals K. Screenings such as hearing and vision if needed, cognitive and depression L. Referrals and appointments - none  In addition, I have reviewed and discussed with patient certain preventive protocols, quality metrics, and best practice recommendations. A written personalized care plan for preventive services as well as general preventive health recommendations were provided to patient.  See attached scanned questionnaire for additional information.   Signed,  Hyacinth Meeker, LPN Nurse Health Advisor

## 2016-06-03 NOTE — Telephone Encounter (Signed)
Spoke with pt in reference to [facility] question. Pt stated that Dr. Ethelene Halamos wants to know if a nerve block would be ok for her to have with interstim in place. [facility] will be doing the nerve block. Please advise.

## 2016-06-12 ENCOUNTER — Encounter: Payer: Self-pay | Admitting: Family Medicine

## 2016-06-12 ENCOUNTER — Ambulatory Visit (INDEPENDENT_AMBULATORY_CARE_PROVIDER_SITE_OTHER): Payer: Medicare Other | Admitting: Family Medicine

## 2016-06-12 ENCOUNTER — Other Ambulatory Visit: Payer: Self-pay | Admitting: Family Medicine

## 2016-06-12 VITALS — BP 130/70 | HR 88 | Temp 97.9°F | Resp 16 | Ht 63.0 in | Wt 198.4 lb

## 2016-06-12 DIAGNOSIS — Z1231 Encounter for screening mammogram for malignant neoplasm of breast: Secondary | ICD-10-CM

## 2016-06-12 DIAGNOSIS — E559 Vitamin D deficiency, unspecified: Secondary | ICD-10-CM | POA: Diagnosis not present

## 2016-06-12 DIAGNOSIS — Z01419 Encounter for gynecological examination (general) (routine) without abnormal findings: Secondary | ICD-10-CM | POA: Diagnosis not present

## 2016-06-12 DIAGNOSIS — R7303 Prediabetes: Secondary | ICD-10-CM | POA: Diagnosis not present

## 2016-06-12 DIAGNOSIS — Z1239 Encounter for other screening for malignant neoplasm of breast: Secondary | ICD-10-CM

## 2016-06-12 DIAGNOSIS — E785 Hyperlipidemia, unspecified: Secondary | ICD-10-CM | POA: Diagnosis not present

## 2016-06-12 DIAGNOSIS — Z124 Encounter for screening for malignant neoplasm of cervix: Secondary | ICD-10-CM | POA: Diagnosis not present

## 2016-06-12 DIAGNOSIS — Z1211 Encounter for screening for malignant neoplasm of colon: Secondary | ICD-10-CM

## 2016-06-12 DIAGNOSIS — Z780 Asymptomatic menopausal state: Secondary | ICD-10-CM | POA: Diagnosis not present

## 2016-06-12 LAB — CBC WITH DIFFERENTIAL/PLATELET
BASOS ABS: 0 {cells}/uL (ref 0–200)
Basophils Relative: 0 %
EOS ABS: 136 {cells}/uL (ref 15–500)
Eosinophils Relative: 2 %
HEMATOCRIT: 39.7 % (ref 35.0–45.0)
Hemoglobin: 13 g/dL (ref 11.7–15.5)
LYMPHS PCT: 37 %
Lymphs Abs: 2516 cells/uL (ref 850–3900)
MCH: 30.4 pg (ref 27.0–33.0)
MCHC: 32.7 g/dL (ref 32.0–36.0)
MCV: 92.8 fL (ref 80.0–100.0)
MONO ABS: 544 {cells}/uL (ref 200–950)
MPV: 9.2 fL (ref 7.5–12.5)
Monocytes Relative: 8 %
NEUTROS PCT: 53 %
Neutro Abs: 3604 cells/uL (ref 1500–7800)
Platelets: 256 10*3/uL (ref 140–400)
RBC: 4.28 MIL/uL (ref 3.80–5.10)
RDW: 14.7 % (ref 11.0–15.0)
WBC: 6.8 10*3/uL (ref 3.8–10.8)

## 2016-06-12 LAB — POCT GLYCOSYLATED HEMOGLOBIN (HGB A1C): Hemoglobin A1C: 6

## 2016-06-12 LAB — TSH: TSH: 1.6 mIU/L

## 2016-06-12 NOTE — Progress Notes (Signed)
Name: Christina Ware   MRN: [id number]    DOB: 09/02/1950   Date:06/12/2016       Progress Note  Subjective  Chief Complaint  Chief Complaint  Patient presents with  . Annual Exam    HPI  Pt. Presents for a complete physical exam. Last Pap smear was by Dr. Vear ClockPhillips many years ago. Never had a colonoscopy Last mammogram was 2 years ago. Never had a Bone density measurement.   Past Medical History:  Diagnosis Date  . Chronic back pain greater than 3 months duration    Degenerative Disc Disease, surgery on lower back.  . Chronic neck pain    Ruptured disc in neck  . Prediabetes     Past Surgical History:  Procedure Laterality Date  . BACK SURGERY  2005   lower back  . FOOT SURGERY Bilateral    twice  . KNEE ARTHROSCOPY Right     Family History  Problem Relation Age of Onset  . Diabetes Mother   . Cancer Mother   . Hypertension Mother   . Diabetes Father   . Cancer Father   . Hypertension Father   . Diabetes Sister   . Hypertension Sister   . Diabetes Brother   . Cancer Brother   . Hypertension Brother   . Diabetes Brother   . Birth defects Brother     born with a hole in his heart  . Lung disease Brother     patient needed a lung transplant  . Heart disease Brother     needed a heart transplant    Social History   Social History  . Marital status: Divorced    Spouse name: N/A  . Number of children: N/A  . Years of education: N/A   Occupational History  . Not on file.   Social History Main Topics  . Smoking status: Former Games developermoker  . Smokeless tobacco: Never Used     Comment: quit when she was [age]yrs old  . Alcohol use No  . Drug use: No  . Sexual activity: No   Other Topics Concern  . Not on file   Social History Narrative  . No narrative on file     Current Outpatient Prescriptions:  .  Acetylcarnitine HCl (ACETYL L-CARNITINE PO), Take 400 mg by mouth., Disp: , Rfl:  .  Alpha-Lipoic Acid 200 MG TABS, Take by mouth., Disp: , Rfl:   .  Black Cohosh ([employer] BLACK COHOSH) 540 MG CAPS, Take by mouth., Disp: , Rfl:  .  Cinnamon 500 MG capsule, Take 500 mg by mouth daily., Disp: , Rfl:  .  docusate sodium (COLACE) 100 MG capsule, Take by mouth., Disp: , Rfl:  .  fentaNYL (DURAGESIC - DOSED MCG/HR) 75 MCG/HR, Place onto the skin., Disp: , Rfl:  .  gabapentin (NEURONTIN) 800 MG tablet, , Disp: , Rfl:  .  L-Arginine 500 MG CAPS, Take by mouth., Disp: , Rfl:  .  Omega-3 Fatty Acids (FISH OIL) 1000 MG CAPS, Take by mouth., Disp: , Rfl:  .  Oxycodone HCl 10 MG TABS, , Disp: , Rfl:  .  tiZANidine (ZANAFLEX) 4 MG tablet, Reported on 02/25/2016, Disp: , Rfl:  .  vitamin E 400 UNIT capsule, Take 400 Units by mouth daily. Reported on 02/25/2016, Disp: , Rfl:   Allergies  Allergen Reactions  . Codeine Other (See Comments)  . Hydrocodone   . Latex      Review of Systems  Constitutional: Positive for malaise/fatigue.  Negative for chills and fever.  HENT: Negative for congestion and sore throat.   Eyes: Negative for blurred vision and double vision.  Respiratory: Negative for cough and shortness of breath.   Cardiovascular: Negative for chest pain, palpitations and leg swelling.  Gastrointestinal: Negative for abdominal pain, blood in stool, constipation (takes a stool softener), diarrhea and melena.  Genitourinary: Negative for dysuria, frequency and hematuria.  Musculoskeletal: Positive for back pain and myalgias.  Skin: Negative for itching and rash.  Neurological: Negative for dizziness and headaches.  Psychiatric/Behavioral: Positive for depression. The patient is not nervous/anxious.     Objective  Vitals:   06/12/16 0857  BP: 130/70  Pulse: 88  Resp: 16  Temp: 97.9 F (36.6 C)  TempSrc: Oral  SpO2: 94%  Weight: 198 lb 6.4 oz (90 kg)  Height: 5\' 3"  (1.6 m)    Physical Exam  Constitutional: She is oriented to person, place, and time and well-developed, well-nourished, and in no distress. Vital signs are normal.   HENT:  Head: Normocephalic and atraumatic.  Right Ear: Tympanic membrane and ear canal normal.  Left Ear: Tympanic membrane and ear canal normal.  Mouth/Throat: No posterior oropharyngeal erythema.  Eyes: Conjunctivae and EOM are normal. Pupils are equal, round, and reactive to light.  Cardiovascular: Normal rate, regular rhythm, S1 normal, S2 normal and normal heart sounds.   No murmur heard. Pulmonary/Chest: Right breast exhibits no inverted nipple, no mass, no skin change and no tenderness. Left breast exhibits no inverted nipple, no mass, no skin change and no tenderness.  Abdominal: Soft. Bowel sounds are normal. There is no tenderness.  Genitourinary: Vagina normal, uterus normal, cervix normal, right adnexa normal and left adnexa normal. Cervix exhibits no lesion and no tenderness. Vagina exhibits no lesion.  Neurological: She is alert and oriented to person, place, and time.  Skin: Skin is warm, dry and intact.  Psychiatric: Mood, memory, affect and judgment normal.  Nursing note and vitals reviewed.    Assessment & Plan  1. Well woman exam with routine gynecological exam EKG is unremarkable, point-of-care A1c is 6.0% consistent with prediabetes. Obtained age appropriate lab work. - EKG 12-Lead - Lipid Profile - COMPLETE METABOLIC PANEL WITH GFR - Vitamin D (25 hydroxy) - TSH - CBC with Differential - POCT HgB A1C  2. Screening for cervical cancer  - Pap IG w/ reflex to HPV when ASC-U  3. Colon cancer screening  - Cologuard  4. Post-menopausal  - DG Bone Density; Future  5. Breast cancer screening  - MM Digital Screening; Future   Christina Ware A. Faylene KurtzShah [facility] 06/12/2016 9:22 AM

## 2016-06-13 LAB — COMPLETE METABOLIC PANEL WITH GFR
ALBUMIN: 4.3 g/dL (ref 3.6–5.1)
ALK PHOS: 64 U/L (ref 33–130)
ALT: 11 U/L (ref 6–29)
AST: 16 U/L (ref 10–35)
BILIRUBIN TOTAL: 0.5 mg/dL (ref 0.2–1.2)
BUN: 15 mg/dL (ref 7–25)
CO2: 24 mmol/L (ref 20–31)
Calcium: 9.3 mg/dL (ref 8.6–10.4)
Chloride: 100 mmol/L (ref 98–110)
Creat: 0.77 mg/dL (ref 0.50–0.99)
GFR, Est African American: >89 mL/min (ref >=60)
GFR, Est Non African American: 82 mL/min (ref >=60)
GLUCOSE: 95 mg/dL (ref 65–99)
Potassium: 4.4 mmol/L (ref 3.5–5.3)
SODIUM: 137 mmol/L (ref 135–146)
TOTAL PROTEIN: 7.3 g/dL (ref 6.1–8.1)

## 2016-06-13 LAB — LIPID PANEL
CHOLESTEROL: 199 mg/dL (ref 125–200)
HDL: 77 mg/dL (ref >=46)
LDL Cholesterol: 94 mg/dL (ref <130)
Total CHOL/HDL Ratio: 2.6 Ratio (ref <=5.0)
Triglycerides: 138 mg/dL (ref <150)
VLDL: 28 mg/dL (ref <30)

## 2016-06-13 LAB — VITAMIN D 25 HYDROXY (VIT D DEFICIENCY, FRACTURES): Vit D, 25-Hydroxy: 22 ng/mL — ABNORMAL LOW (ref 30–100)

## 2016-06-16 LAB — PAP IG W/ RFLX HPV ASCU

## 2016-06-19 ENCOUNTER — Telehealth: Payer: Self-pay

## 2016-06-19 MED ORDER — VITAMIN D (ERGOCALCIFEROL) 1.25 MG (50000 UNIT) PO CAPS: 50000.0000 [IU] | ORAL_CAPSULE | ORAL | 0 refills | Status: DC

## 2016-06-19 NOTE — Telephone Encounter (Signed)
Patient has been notified of lab results and a prescription for vitamin D3 50,000 units 1 capsule once a week x12 weeks [facility] per Dr. Sherryll BurgerShah, patient has been notified and verbalized understanding

## 2016-06-30 DIAGNOSIS — Z1211 Encounter for screening for malignant neoplasm of colon: Secondary | ICD-10-CM | POA: Diagnosis not present

## 2016-06-30 DIAGNOSIS — Z1212 Encounter for screening for malignant neoplasm of rectum: Secondary | ICD-10-CM | POA: Diagnosis not present

## 2016-07-08 ENCOUNTER — Ambulatory Visit (INDEPENDENT_AMBULATORY_CARE_PROVIDER_SITE_OTHER): Payer: Medicare Other | Admitting: Family Medicine

## 2016-07-08 ENCOUNTER — Encounter: Payer: Self-pay | Admitting: Family Medicine

## 2016-07-08 VITALS — BP 128/73 | HR 89 | Temp 98.0°F | Resp 17 | Ht 63.0 in | Wt 202.5 lb

## 2016-07-08 DIAGNOSIS — B079 Viral wart, unspecified: Secondary | ICD-10-CM

## 2016-07-08 DIAGNOSIS — E669 Obesity, unspecified: Secondary | ICD-10-CM

## 2016-07-08 HISTORY — DX: Viral wart, unspecified: B07.9

## 2016-07-08 MED ORDER — LORCASERIN HCL 10 MG PO TABS: 1.0000 | ORAL_TABLET | Freq: Two times a day (BID) | ORAL | 0 refills | Status: DC

## 2016-07-08 NOTE — Progress Notes (Signed)
Name: Christina ResidesCarolyn S Buckhalter   MRN: [id number]    DOB: 03/18/1951   Date:07/08/2016       Progress Note  Subjective  Chief Complaint  Chief Complaint  Patient presents with  . Follow-up    3 wk    HPI  Obesity:  Pt. Wants to be started on a medication to lose weight, currently she weighs 202 lbs, BMI is 35.87kg/m2, she feels like eating all the time especially sweets. She feels unable to control herself and feels frustrated as a result.  Warts on hands: Pt. Presents for evaluation of multiple dome shaped warts on her both hands, present for over 6 months, initially were only on the right hand and now spreading to the left hand. Has used OTC Wart remover but most have come back.    Past Medical History:  Diagnosis Date  . Chronic back pain greater than 3 months duration    Degenerative Disc Disease, surgery on lower back.  . Chronic neck pain    Ruptured disc in neck  . Prediabetes     Past Surgical History:  Procedure Laterality Date  . BACK SURGERY  2005   lower back  . FOOT SURGERY Bilateral    twice  . KNEE ARTHROSCOPY Right     Family History  Problem Relation Age of Onset  . Diabetes Mother   . Cancer Mother   . Hypertension Mother   . Diabetes Father   . Cancer Father   . Hypertension Father   . Diabetes Sister   . Hypertension Sister   . Diabetes Brother   . Cancer Brother   . Hypertension Brother   . Diabetes Brother   . Birth defects Brother     born with a hole in his heart  . Lung disease Brother     patient needed a lung transplant  . Heart disease Brother     needed a heart transplant    Social History   Social History  . Marital status: Divorced    Spouse name: N/A  . Number of children: N/A  . Years of education: N/A   Occupational History  . Not on file.   Social History Main Topics  . Smoking status: Former Games developermoker  . Smokeless tobacco: Never Used     Comment: quit when she was 65yrs old  . Alcohol use No  . Drug use: No  .  Sexual activity: No   Other Topics Concern  . Not on file   Social History Narrative  . No narrative on file     Current Outpatient Prescriptions:  .  Alpha-Lipoic Acid 200 MG TABS, Take by mouth., Disp: , Rfl:  .  Black Cohosh ([employer] BLACK COHOSH) 540 MG CAPS, Take by mouth., Disp: , Rfl:  .  docusate sodium (COLACE) 100 MG capsule, Take by mouth., Disp: , Rfl:  .  fentaNYL (DURAGESIC - DOSED MCG/HR) 75 MCG/HR, Place onto the skin., Disp: , Rfl:  .  gabapentin (NEURONTIN) 800 MG tablet, , Disp: , Rfl:  .  Omega-3 Fatty Acids (FISH OIL) 1000 MG CAPS, Take by mouth., Disp: , Rfl:  .  Oxycodone HCl 10 MG TABS, , Disp: , Rfl:  .  Vitamin D, Ergocalciferol, (DRISDOL) 50000 units CAPS capsule, Take 1 capsule (50,000 Units total) by mouth once a week. For 12 weeks, Disp: 12 capsule, Rfl: 0 .  vitamin E 400 UNIT capsule, Take 400 Units by mouth daily. Reported on 02/25/2016, Disp: , Rfl:  .  Acetylcarnitine HCl (ACETYL L-CARNITINE PO), Take 400 mg by mouth., Disp: , Rfl:  .  Cinnamon 500 MG capsule, Take 500 mg by mouth daily., Disp: , Rfl:  .  L-Arginine 500 MG CAPS, Take by mouth., Disp: , Rfl:  .  tiZANidine (ZANAFLEX) 4 MG tablet, Reported on 02/25/2016, Disp: , Rfl:   Allergies  Allergen Reactions  . Codeine Other (See Comments)  . Hydrocodone   . Latex      ROS  Please see history of present illness for complete description of ROS.  Objective  Vitals:   07/08/16 1359  BP: 128/73  Pulse: 89  Resp: 17  Temp: 98 F (36.7 C)  TempSrc: Oral  SpO2: 96%  Weight: 202 lb 8 oz (91.9 kg)  Height: 5\' 3"  (1.6 m)    Physical Exam  Constitutional: She is oriented to person, place, and time and well-developed, well-nourished, and in no distress.  HENT:  Head: Normocephalic and atraumatic.  Cardiovascular: Normal rate and regular rhythm.   No murmur heard. Pulmonary/Chest: Effort normal and breath sounds normal. She has no wheezes.  Neurological: She is alert and oriented to  person, place, and time.  Skin: Skin is warm and dry.  Multiple round dome shaped, pink colored warts on both hands.  Psychiatric: Mood, memory, affect and judgment normal.  Nursing note and vitals reviewed.      Assessment & Plan  1. Viral warts, unspecified type  - Ambulatory referral to Dermatology  2. Obesity (BMI 35.0-39.9 without comorbidity) We'll start on the week 10 mg twice daily, advised to continue with dietary and lifestyle changes. Reassess in 3 months - Lorcaserin HCl (BELVIQ) 10 MG TABS; Take 1 tablet by mouth 2 (two) times daily.  Dispense: 180 tablet; Refill: 0   Elmina Hendel Asad A. Faylene KurtzShah [facility] 07/08/2016 2:12 PM

## 2016-07-09 LAB — COLOGUARD

## 2016-08-28 ENCOUNTER — Ambulatory Visit: Payer: Medicare Other

## 2016-09-04 DIAGNOSIS — G894 Chronic pain syndrome: Secondary | ICD-10-CM | POA: Diagnosis not present

## 2016-09-04 DIAGNOSIS — M961 Postlaminectomy syndrome, not elsewhere classified: Secondary | ICD-10-CM | POA: Diagnosis not present

## 2016-09-04 DIAGNOSIS — Z79891 Long term (current) use of opiate analgesic: Secondary | ICD-10-CM | POA: Diagnosis not present

## 2016-10-01 ENCOUNTER — Ambulatory Visit
Admission: RE | Admit: 2016-10-01 | Discharge: 2016-10-01 | Disposition: A | Discharge status: Home / Self Care | Payer: Medicare Other | Source: Ambulatory Visit | Attending: Family Medicine | Admitting: Family Medicine

## 2016-10-01 DIAGNOSIS — M85852 Other specified disorders of bone density and structure, left thigh: Secondary | ICD-10-CM | POA: Insufficient documentation

## 2016-10-01 DIAGNOSIS — Z1231 Encounter for screening mammogram for malignant neoplasm of breast: Secondary | ICD-10-CM | POA: Diagnosis not present

## 2016-10-01 DIAGNOSIS — Z1239 Encounter for other screening for malignant neoplasm of breast: Secondary | ICD-10-CM

## 2016-10-01 DIAGNOSIS — Z780 Asymptomatic menopausal state: Secondary | ICD-10-CM | POA: Diagnosis not present

## 2016-10-17 ENCOUNTER — Emergency Department
Admission: EM | Admit: 2016-10-17 | Discharge: 2016-10-17 | Disposition: A | Discharge status: Home / Self Care | Payer: Medicare Other | Attending: Emergency Medicine | Admitting: Emergency Medicine

## 2016-10-17 ENCOUNTER — Encounter: Payer: Self-pay | Admitting: Emergency Medicine

## 2016-10-17 DIAGNOSIS — M545 Low back pain: Secondary | ICD-10-CM | POA: Insufficient documentation

## 2016-10-17 DIAGNOSIS — R112 Nausea with vomiting, unspecified: Secondary | ICD-10-CM | POA: Diagnosis not present

## 2016-10-17 DIAGNOSIS — G8929 Other chronic pain: Secondary | ICD-10-CM | POA: Insufficient documentation

## 2016-10-17 DIAGNOSIS — Z87891 Personal history of nicotine dependence: Secondary | ICD-10-CM | POA: Insufficient documentation

## 2016-10-17 DIAGNOSIS — R197 Diarrhea, unspecified: Secondary | ICD-10-CM | POA: Diagnosis not present

## 2016-10-17 DIAGNOSIS — M25551 Pain in right hip: Secondary | ICD-10-CM | POA: Insufficient documentation

## 2016-10-17 DIAGNOSIS — M25552 Pain in left hip: Secondary | ICD-10-CM | POA: Diagnosis not present

## 2016-10-17 LAB — URINALYSIS, COMPLETE (UACMP) WITH MICROSCOPIC
BILIRUBIN URINE: NEGATIVE
Glucose, UA: NEGATIVE mg/dL
KETONES UR: 5 mg/dL — AB
Nitrite: NEGATIVE
Protein, ur: NEGATIVE mg/dL
Specific Gravity, Urine: 1.02 (ref 1.005–1.030)
pH: 6 (ref 5.0–8.0)

## 2016-10-17 LAB — COMPREHENSIVE METABOLIC PANEL
ALT: 16 U/L (ref 14–54)
AST: 28 U/L (ref 15–41)
Albumin: 4.2 g/dL (ref 3.5–5.0)
Alkaline Phosphatase: 63 U/L (ref 38–126)
Anion gap: 9 (ref 5–15)
BUN: 13 mg/dL (ref 6–20)
CHLORIDE: 105 mmol/L (ref 101–111)
CO2: 25 mmol/L (ref 22–32)
Calcium: 8.7 mg/dL — ABNORMAL LOW (ref 8.9–10.3)
Creatinine, Ser: 0.75 mg/dL (ref 0.44–1.00)
Glucose, Bld: 157 mg/dL — ABNORMAL HIGH (ref 65–99)
POTASSIUM: 3.9 mmol/L (ref 3.5–5.1)
SODIUM: 139 mmol/L (ref 135–145)
Total Bilirubin: 0.3 mg/dL (ref 0.3–1.2)
Total Protein: 8 g/dL (ref 6.5–8.1)

## 2016-10-17 LAB — CBC
HEMATOCRIT: 41.9 % (ref 35.0–47.0)
Hemoglobin: 14.1 g/dL (ref 12.0–16.0)
MCH: 31.1 pg (ref 26.0–34.0)
MCHC: 33.6 g/dL (ref 32.0–36.0)
MCV: 92.5 fL (ref 80.0–100.0)
Platelets: 274 10*3/uL (ref 150–440)
RBC: 4.54 MIL/uL (ref 3.80–5.20)
RDW: 13.6 % (ref 11.5–14.5)
WBC: 11.1 10*3/uL — AB (ref 3.6–11.0)

## 2016-10-17 LAB — LIPASE, BLOOD: LIPASE: 21 U/L (ref 11–51)

## 2016-10-17 MED ORDER — LOPERAMIDE HCL 2 MG PO TABS: 4.0000 mg | ORAL_TABLET | Freq: Four times a day (QID) | ORAL | 0 refills | Status: DC | PRN

## 2016-10-17 MED ORDER — ONDANSETRON HCL 4 MG/2ML IJ SOLN
4.0000 mg | Freq: Once | INTRAMUSCULAR | Status: AC
  Administered 2016-10-17: 4 mg via INTRAVENOUS
  Filled 2016-10-17: qty 2

## 2016-10-17 MED ORDER — LOPERAMIDE HCL 2 MG PO CAPS
4.0000 mg | ORAL_CAPSULE | Freq: Once | ORAL | Status: AC
  Administered 2016-10-17: 4 mg via ORAL
  Filled 2016-10-17: qty 2

## 2016-10-17 MED ORDER — OXYCODONE-ACETAMINOPHEN 5-325 MG PO TABS
ORAL_TABLET | ORAL | Status: AC
  Administered 2016-10-17: 2 via ORAL
  Filled 2016-10-17: qty 2

## 2016-10-17 MED ORDER — ONDANSETRON 8 MG PO TBDP: 8.0000 mg | ORAL_TABLET | Freq: Three times a day (TID) | ORAL | 0 refills | Status: DC | PRN

## 2016-10-17 MED ORDER — OXYCODONE-ACETAMINOPHEN 5-325 MG PO TABS
2.0000 | ORAL_TABLET | Freq: Once | ORAL | Status: AC
  Administered 2016-10-17: 2 via ORAL

## 2016-10-17 MED ORDER — SODIUM CHLORIDE 0.9 % IV BOLUS (SEPSIS)
1000.0000 mL | Freq: Once | INTRAVENOUS | Status: AC
  Administered 2016-10-17: 1000 mL via INTRAVENOUS

## 2016-10-17 MED ORDER — SODIUM CHLORIDE 0.9 % IV BOLUS (SEPSIS): 1000.0000 mL | Freq: Once | INTRAVENOUS | Status: DC

## 2016-10-17 MED ORDER — HALOPERIDOL LACTATE 5 MG/ML IJ SOLN
INTRAMUSCULAR | Status: AC
  Administered 2016-10-17: 2.5 mg via INTRAVENOUS
  Filled 2016-10-17: qty 1

## 2016-10-17 MED ORDER — HALOPERIDOL LACTATE 5 MG/ML IJ SOLN
2.5000 mg | Freq: Once | INTRAMUSCULAR | Status: AC
  Administered 2016-10-17: 2.5 mg via INTRAVENOUS

## 2016-10-17 NOTE — ED Triage Notes (Signed)
Pt was visited by family today and they called out EMS dt patient having n/v/d all day today.  Pt is co severe chills and feels she cannot get warm.  She says she has had vomiting x4 today, and "squirty" diarrhea "a lot".  Pt was given 4 mg Zofran en route by EMS.  She is co being cold during triage with her only complaint being nausea.

## 2016-10-17 NOTE — ED Provider Notes (Signed)
[facility] Main Emergency Department Provider Note  ____________________________________________  Time seen: Approximately 5:02 PM  I have reviewed the triage vital signs and the nursing notes.   HISTORY  Chief Complaint Nausea    HPI Christina Ware is a 66 y.o. female who complains of nausea vomiting and diarrhea started today. She denies any pain. She does have chronic bilateral hip pain which she says is unchanged. No falls or other trauma. No fever or chills. No bloody vomit or stool. Difficulty eating or drinking anything today.     Past Medical History:  Diagnosis Date  . Chronic back pain greater than 3 months duration    Degenerative Disc Disease, surgery on lower back.  . Chronic neck pain    Ruptured disc in neck  . Prediabetes      Patient Active Problem List   Diagnosis Date Noted  . Warts 07/08/2016  . Obesity (BMI 35.0-39.9 without comorbidity) 07/08/2016  . Well woman exam with routine gynecological exam 06/12/2016  . Bright red rectal bleeding 12/14/2015  . UTI symptoms 09/04/2015  . Prediabetes 07/26/2015     Past Surgical History:  Procedure Laterality Date  . BACK SURGERY  2005   lower back  . FOOT SURGERY Bilateral    twice  . KNEE ARTHROSCOPY Right      Prior to Admission medications   Medication Sig Start Date End Date Taking? Authorizing Provider  Acetylcarnitine HCl (ACETYL L-CARNITINE PO) Take 400 mg by mouth.    Historical Provider, MD  Alpha-Lipoic Acid 200 MG TABS Take by mouth.    Historical Provider, MD  Black Cohosh ([employer] BLACK COHOSH) 540 MG CAPS Take by mouth.    Historical Provider, MD  Cinnamon 500 MG capsule Take 500 mg by mouth daily.    Historical Provider, MD  docusate sodium (COLACE) 100 MG capsule Take by mouth.    Historical Provider, MD  fentaNYL (DURAGESIC - DOSED MCG/HR) 75 MCG/HR Place onto the skin.    Historical Provider, MD  gabapentin (NEURONTIN) 800 MG tablet  07/10/15   Historical  Provider, MD  L-Arginine 500 MG CAPS Take by mouth.    Historical Provider, MD  loperamide (IMODIUM A-D) 2 MG tablet Take 2 tablets (4 mg total) by mouth 4 (four) times daily as needed for diarrhea or loose stools. 10/17/16   Sharman Cheek, MD  Lorcaserin HCl (BELVIQ) 10 MG TABS Take 1 tablet by mouth 2 (two) times daily. 07/08/16   Ellyn Hack, MD  Omega-3 Fatty Acids (FISH OIL) 1000 MG CAPS Take by mouth.    Historical Provider, MD  ondansetron (ZOFRAN ODT) 8 MG disintegrating tablet Take 1 tablet (8 mg total) by mouth every 8 (eight) hours as needed for nausea or vomiting. 10/17/16   Sharman Cheek, MD  Oxycodone HCl 10 MG TABS  07/04/15   Historical Provider, MD  tiZANidine (ZANAFLEX) 4 MG tablet Reported on 02/25/2016 04/26/15   Historical Provider, MD  Vitamin D, Ergocalciferol, (DRISDOL) 50000 units CAPS capsule Take 1 capsule (50,000 Units total) by mouth once a week. For 12 weeks 06/19/16   Ellyn Hack, MD  vitamin E 400 UNIT capsule Take 400 Units by mouth daily. Reported on 02/25/2016    Historical Provider, MD     Allergies Codeine; Hydrocodone; and Latex   Family History  Problem Relation Age of Onset  . Diabetes Mother   . Cancer Mother   . Hypertension Mother   . Diabetes Father   .  Cancer Father   . Hypertension Father   . Diabetes Sister   . Hypertension Sister   . Diabetes Brother   . Cancer Brother   . Hypertension Brother   . Diabetes Brother   . Birth defects Brother     born with a hole in his heart  . Lung disease Brother     patient needed a lung transplant  . Heart disease Brother     needed a heart transplant    Social History Social History  Substance Use Topics  . Smoking status: Former Games developer  . Smokeless tobacco: Never Used     Comment: quit when she was 66yrs old  . Alcohol use No    Review of Systems  Constitutional:   No fever or chills.  ENT:   No sore throat. No rhinorrhea. Cardiovascular:   No chest pain. Respiratory:    No dyspnea or cough. Gastrointestinal:   Negative for abdominal pain, Positive vomiting and diarrhea.  Genitourinary:   Negative for dysuria or difficulty urinating. Musculoskeletal:  Positive chronic bilateral lower back pain and hip pain Neurological:   Negative for headaches 10-point ROS otherwise negative.  ____________________________________________   PHYSICAL EXAM:  VITAL SIGNS: ED Triage Vitals  Enc Vitals Group     BP 10/17/16 1546 (!) 163/131     Pulse Rate 10/17/16 1546 82     Resp 10/17/16 1546 16     Temp 10/17/16 1546 98.4 F (36.9 C)     Temp Source 10/17/16 1546 Oral     SpO2 10/17/16 1539 98 %     Weight 10/17/16 1546 142 lb 13.7 oz (64.8 kg)     Height 10/17/16 1546  (1.6 m)     Head Circumference --      Peak Flow --      Pain Score --      Pain Loc --      Pain Edu? --      Excl. in GC? --     Vital signs reviewed, nursing assessments reviewed.   Constitutional:   Alert and oriented. Well appearing and in no distress. Eyes:   No scleral icterus. No conjunctival pallor. PERRL. EOMI.  No nystagmus. ENT   Head:   Normocephalic and atraumatic.   Nose:   No congestion/rhinnorhea. No septal hematoma   Mouth/Throat:   MMM, no pharyngeal erythema. No peritonsillar mass.    Neck:   No stridor. No SubQ emphysema. No meningismus. Hematological/Lymphatic/Immunilogical:   No cervical lymphadenopathy. Cardiovascular:   RRR. Symmetric bilateral radial and DP pulses.  No murmurs.  Respiratory:   Normal respiratory effort without tachypnea nor retractions. Breath sounds are clear and equal bilaterally. No wheezes/rales/rhonchi. Gastrointestinal:   Soft and nontender. Non distended. There is no CVA tenderness.  No rebound, rigidity, or guarding. Genitourinary:   deferred Musculoskeletal:   Normal range of motion in all extremities. No joint effusions.  No lower extremity tenderness.  No edema. Neurologic:   Normal speech and language.  CN 2-10  normal. Motor grossly intact. No gross focal neurologic deficits are appreciated.  Skin:    Skin is warm, dry and intact. No rash noted.  No petechiae, purpura, or bullae.  ____________________________________________    LABS (pertinent positives/negatives) (all labs ordered are listed, but only abnormal results are displayed) Labs Reviewed  COMPREHENSIVE METABOLIC PANEL - Abnormal; Notable for the following:       Result Value   Glucose, Bld 157 (*)    Calcium 8.7 (*)  All other components within normal limits  CBC - Abnormal; Notable for the following:    WBC 11.1 (*)    All other components within normal limits  URINALYSIS, COMPLETE (UACMP) WITH MICROSCOPIC - Abnormal; Notable for the following:    Color, Urine YELLOW (*)    APPearance CLEAR (*)    Hgb urine dipstick SMALL (*)    Ketones, ur 5 (*)    Leukocytes, UA SMALL (*)    Bacteria, UA RARE (*)    Squamous Epithelial / LPF 0-5 (*)    All other components within normal limits  LIPASE, BLOOD   ____________________________________________   EKG    ____________________________________________    RADIOLOGY  No results found.  ____________________________________________   PROCEDURES Procedures  ____________________________________________   INITIAL IMPRESSION / ASSESSMENT AND PLAN / ED COURSE  Pertinent labs & imaging results that were available during my care of the patient were reviewed by me and considered in my medical decision making (see chart for details).  Patient well appearing no acute distress unremarkable vital signs, complains of nausea vomiting diarrhea. Abdominal exam is benign and reassuring. Considering the patient's symptoms, medical history, and physical examination today, I have low suspicion for cholecystitis or biliary pathology, pancreatitis, perforation or bowel obstruction, hernia, intra-abdominal abscess, AAA or dissection, volvulus or intussusception, mesenteric ischemia, or  appendicitis.  Treat patient symptomatically with IV fluids and antiemetics. She is hyperglycemic so we'll check labs to ensure she is not acidotic or has otherwise severe features. Clinically this is a viral gastroenteritis, possibly influenza which is rampant in this community currently. She is suitable for outpatient follow-up if symptoms can be controlled.    ----------------------------------------- 7:23 PM on 10/17/2016 -----------------------------------------  Patient feeling better, vital signs stable. Tolerating oral intake. Zofran and Imodium, follow-up with primary care.       ____________________________________________   FINAL CLINICAL IMPRESSION(S) / ED DIAGNOSES  Final diagnoses:  Nausea vomiting and diarrhea      New Prescriptions   LOPERAMIDE (IMODIUM A-D) 2 MG TABLET    Take 2 tablets (4 mg total) by mouth 4 (four) times daily as needed for diarrhea or loose stools.   ONDANSETRON (ZOFRAN ODT) 8 MG DISINTEGRATING TABLET    Take 1 tablet (8 mg total) by mouth every 8 (eight) hours as needed for nausea or vomiting.     Portions of this note were generated with dragon dictation software. Dictation errors may occur despite best attempts at proofreading.    Sharman Cheek, MD 10/17/16 1924

## 2016-10-17 NOTE — ED Notes (Signed)
Patient given food tray

## 2016-10-17 NOTE — ED Notes (Signed)
Patient is able to tolerate food and drink without emesis, notifying MD.

## 2017-01-02 DIAGNOSIS — Z79891 Long term (current) use of opiate analgesic: Secondary | ICD-10-CM | POA: Diagnosis not present

## 2017-01-02 DIAGNOSIS — M961 Postlaminectomy syndrome, not elsewhere classified: Secondary | ICD-10-CM | POA: Diagnosis not present

## 2017-01-02 DIAGNOSIS — G894 Chronic pain syndrome: Secondary | ICD-10-CM | POA: Diagnosis not present

## 2017-03-19 DIAGNOSIS — H3562 Retinal hemorrhage, left eye: Secondary | ICD-10-CM | POA: Diagnosis not present

## 2017-03-19 DIAGNOSIS — H04123 Dry eye syndrome of bilateral lacrimal glands: Secondary | ICD-10-CM | POA: Diagnosis not present

## 2017-03-19 DIAGNOSIS — H524 Presbyopia: Secondary | ICD-10-CM | POA: Diagnosis not present

## 2017-03-23 ENCOUNTER — Encounter: Payer: Self-pay | Admitting: Urology

## 2017-03-23 ENCOUNTER — Ambulatory Visit: Payer: Medicare Other | Admitting: Urology

## 2017-03-23 VITALS — BP 118/76 | HR 92 | Ht 63.0 in | Wt 195.2 lb

## 2017-03-23 DIAGNOSIS — R351 Nocturia: Secondary | ICD-10-CM | POA: Diagnosis not present

## 2017-03-23 DIAGNOSIS — N3946 Mixed incontinence: Secondary | ICD-10-CM

## 2017-03-23 LAB — URINALYSIS, COMPLETE
Bilirubin, UA: NEGATIVE
Glucose, UA: NEGATIVE
NITRITE UA: NEGATIVE
RBC, UA: NEGATIVE
Specific Gravity, UA: 1.025 (ref 1.005–1.030)
UUROB: 0.2 mg/dL (ref 0.2–1.0)
pH, UA: 6.5 (ref 5.0–7.5)

## 2017-03-23 LAB — MICROSCOPIC EXAMINATION

## 2017-03-23 NOTE — Progress Notes (Signed)
Interstim Check:  Impedance check showed an impedance at 0&2 , 0&3 @1 .0  Amp rechecking impedance at 1.5 Amp resolved impedance.  Battery Life : 26-45 months  Patient states her symptoms of increased nocturia started about 3 months ago when she was going around a chair and she hit her box on a chair and it got "hung up". Ever since this patient has been having increased frequency but only at night. Her program was set to program #1 for 94% until two weeks ago when she switched to program #4 @1 .2 Amp she did adjust her Amp but has not had any relief.    She was switched to program #2 @1 .5 Amp feeling sensation in the rectum.  +3 -2 _1 _0  Patient was told to keep a voiding dairy for this week to montior status and see next week for re-evaluation.

## 2017-03-23 NOTE — Progress Notes (Signed)
03/23/2017 12:45 PM   Christina Ware February 03, 1951 [id number]  Referring provider: Ellyn Hack, MD [address] [place], [place] [address]  Chief Complaint  Patient presents with  . Urinary Incontinence    HPI: (one year ago) The patient describes an InterStim placed by Dr. Edwyna Shell a number of years ago for severe frequency. She said she was not emptying well. Sometimes the details of her history difficult to ascertain  Over a few months she said she could not urinate well. We feel like she could explore but could not go. A few days ago she finally turned the InterStim device off and she said she voided much better. Currently her flow was good or poor. She voids 3-4 times an hour and getting up at least 8-10 times a night  On further review of the medical records from October 2015 in noted the device was placed October 2014. Her residual on that day was 94 mL. She had been taught how to self catheterize in the past. It noted that the device was placed for retention. Having said that she had a lot of frequency and nighttime frequency and incontinence than as well.    The patient has flow symptoms and severe frequency and nighttime frequency. She had flow symptoms and cannot urinate or worsening or a few months and she turned the device off. She actually was doing reasonably well prior to this. The patient was scanned today for 93 mL. She was sitting on the toilet for a few hours and finally catheterized at home and describes very little volume.  She appears to be scribing bladder overactivity with the associated feeling of retention and incomplete bladder emptying and the patient predisposed to this symptom.   The nurses looked into her notes and appears at the residuals are always less than 100 mL and she did void up to 40 times a day. This supports an overactive bladder diagnosis and possibly now a malfunctioning InterStim  The patient's impedance was abnormal in  the least 2 leads. The [employer] representative reprogram the device around these positions We will reassess the patient 8 weeks. If the settings are reach the patient's treatment goal one could consider replacement  Today In the last 3 months the patient is voiding more frequently at night. She can void every 20 minutes. She denies ankle edema. Her daytime frequency is still every 4 or 5 hours. Clinically she is not infected. She has tried turning up the device and she feels it more rectal. She has tried different settings. When it was working differently she will get up twice a night  She does feel the device protruding a little bit in the right mid or upper buttock and and can brush again things in a tight situation  On physical examination there was no tenderness. The device was protruding a little bit medially. The device is located in the right mid or upper buttock.  Urine did not look infected but is sent for culture. Maralyn Sago did some trouble shooting and again there was some impedance issues that I believe normalized as she turned up the amplitude. She did some different settings. The patient will keep a diary and be reassessed by Maralyn Sago next week. Will will proceed accordingly. I did not mention DDAVP  Modifying factors: There are no other modifying factors  Associated signs and symptoms: There are no other associated signs and symptoms Aggravating and relieving factors: There are no other aggravating or relieving factors Severity:  Moderate Duration: Persistent  PMH: Past Medical History:  Diagnosis Date  . Chronic back pain greater than 3 months duration    Degenerative Disc Disease, surgery on lower back.  . Chronic neck pain    Ruptured disc in neck  . Prediabetes     Surgical History: Past Surgical History:  Procedure Laterality Date  . BACK SURGERY  2005   lower back  . FOOT SURGERY Bilateral    twice  . KNEE ARTHROSCOPY Right     Home Medications:  Allergies as of  03/23/2017      Reactions   Codeine Other (See Comments)   Hydrocodone    Latex       Medication List       Accurate as of 03/23/17 12:45 PM. Always use your most recent med list.          ACETYL L-CARNITINE PO Take 400 mg by mouth.   Alpha-Lipoic Acid 200 MG Tabs Take by mouth.   Cinnamon 500 MG capsule Take 500 mg by mouth daily.   [employer] BLACK COHOSH 540 MG Caps Generic drug:  Black Cohosh Take by mouth.   docusate sodium 100 MG capsule Commonly known as:  COLACE Take by mouth.   fentaNYL 75 MCG/HR Commonly known as:  DURAGESIC - dosed mcg/hr Place 50 mcg onto the skin.   Fish Oil 1000 MG Caps Take by mouth.   gabapentin 800 MG tablet Commonly known as:  NEURONTIN   L-Arginine 500 MG Caps Take by mouth.   loperamide 2 MG tablet Commonly known as:  IMODIUM A-D Take 2 tablets (4 mg total) by mouth 4 (four) times daily as needed for diarrhea or loose stools.   Lorcaserin HCl 10 MG Tabs Commonly known as:  BELVIQ Take 1 tablet by mouth 2 (two) times daily.   ondansetron 8 MG disintegrating tablet Commonly known as:  ZOFRAN ODT Take 1 tablet (8 mg total) by mouth every 8 (eight) hours as needed for nausea or vomiting.   Oxycodone HCl 10 MG Tabs   tiZANidine 4 MG tablet Commonly known as:  ZANAFLEX Reported on 02/25/2016   Vitamin D (Ergocalciferol) 50000 units Caps capsule Commonly known as:  DRISDOL Take 1 capsule (50,000 Units total) by mouth once a week. For 12 weeks   vitamin E 400 UNIT capsule Take 400 Units by mouth daily. Reported on 02/25/2016       Allergies:  Allergies  Allergen Reactions  . Codeine Other (See Comments)  . Hydrocodone   . Latex     Family History: Family History  Problem Relation Age of Onset  . Diabetes Mother   . Cancer Mother   . Hypertension Mother   . Diabetes Father   . Cancer Father   . Hypertension Father   . Diabetes Sister   . Hypertension Sister   . Diabetes Brother   . Cancer Brother   .  Hypertension Brother   . Diabetes Brother   . Birth defects Brother        born with a hole in his heart  . Lung disease Brother        patient needed a lung transplant  . Heart disease Brother        needed a heart transplant    Social History:  reports that she has quit smoking. She has never used smokeless tobacco. She reports that she does not drink alcohol or use drugs.  ROS: UROLOGY Frequent Urination?: Yes Hard to postpone urination?: Yes  Burning/pain with urination?: No Get up at night to urinate?: Yes Leakage of urine?: No Urine stream starts and stops?: No Trouble starting stream?: No Do you have to strain to urinate?: No Blood in urine?: No Urinary tract infection?: No Sexually transmitted disease?: No Injury to kidneys or bladder?: No Painful intercourse?: No Weak stream?: No Currently pregnant?: No Vaginal bleeding?: No Last menstrual period?: n  Gastrointestinal Nausea?: No Vomiting?: No Indigestion/heartburn?: No Diarrhea?: No Constipation?: No  Constitutional Fever: No Night sweats?: Yes Weight loss?: No Fatigue?: No  Skin Skin rash/lesions?: No Itching?: No  Eyes Blurred vision?: No Double vision?: No  Ears/Nose/Throat Sore throat?: No Sinus problems?: Yes  Hematologic/Lymphatic Swollen glands?: No Easy bruising?: No  Cardiovascular Leg swelling?: No Chest pain?: No  Respiratory Cough?: No Shortness of breath?: No  Endocrine Excessive thirst?: Yes  Musculoskeletal Back pain?: Yes Joint pain?: No  Neurological Headaches?: No Dizziness?: Yes  Psychologic Depression?: Yes Anxiety?: No  Physical Exam: BP 118/76 (BP Location: Left Arm, Patient Position: Sitting, Cuff Size: Normal)   Pulse 92   Ht 5\' 3"  (1.6 m)   Wt 195 lb 3.2 oz (88.5 kg)   BMI 34.58 kg/m   Constitutional:  Alert and oriented, No acute distress.   Laboratory Data: Lab Results  Component Value Date   WBC 11.1 (H) 10/17/2016   HGB 14.1  10/17/2016   HCT 41.9 10/17/2016   MCV 92.5 10/17/2016   PLT 274 10/17/2016    Lab Results  Component Value Date   CREATININE 0.75 10/17/2016    No results found for: PSA  No results found for: TESTOSTERONE  Lab Results  Component Value Date   HGBA1C 6.0 06/12/2016    Urinalysis    Component Value Date/Time   COLORURINE YELLOW (A) 10/17/2016 1540   APPEARANCEUR CLEAR (A) 10/17/2016 1540   APPEARANCEUR Hazy (A) 01/28/2016 1510   LABSPEC 1.020 10/17/2016 1540   LABSPEC 1.020 05/14/2012 1702   PHURINE 6.0 10/17/2016 1540   GLUCOSEU NEGATIVE 10/17/2016 1540   GLUCOSEU Negative 05/14/2012 1702   HGBUR SMALL (A) 10/17/2016 1540   BILIRUBINUR NEGATIVE 10/17/2016 1540   BILIRUBINUR Negative 01/28/2016 1510   BILIRUBINUR Negative 05/14/2012 1702   KETONESUR 5 (A) 10/17/2016 1540   PROTEINUR NEGATIVE 10/17/2016 1540   UROBILINOGEN 0.2 09/04/2015 1338   UROBILINOGEN 0.2 05/17/2009 1634   NITRITE NEGATIVE 10/17/2016 1540   LEUKOCYTESUR SMALL (A) 10/17/2016 1540   LEUKOCYTESUR 1+ (A) 01/28/2016 1510   LEUKOCYTESUR Negative 05/14/2012 1702    Pertinent Imaging: none  Assessment & Plan:  The patient has worsening nighttime frequency. Her urine was sent for culture. Reassess as noted above  There are no diagnoses linked to this encounter.  No Follow-up on file.  Martina SinnerMACDIARMID,Digby Groeneveld A, MD  [facility] [address], [address] [place], [place] [address] [phone]

## 2017-03-23 NOTE — Addendum Note (Signed)
Addended by: Penda Venturi C on: 03/23/2017 01:54 PM   Modules accepted: Orders  

## 2017-03-26 LAB — CULTURE, URINE COMPREHENSIVE

## 2017-03-30 ENCOUNTER — Ambulatory Visit: Payer: Medicare Other

## 2017-03-30 DIAGNOSIS — R350 Frequency of micturition: Secondary | ICD-10-CM

## 2017-03-31 LAB — BLADDER SCAN AMB NON-IMAGING

## 2017-03-31 NOTE — Progress Notes (Signed)
Patient present today for interstim recheck. Patient states symptoms worsened after visit last week. Urine culture was negative. Voiding diary showed an increase in voiding from 8 times daily increased to 16 times daily.  A PVR was done to make sure patient was emptying, PVR- 83ml.  Patient's device was synced and the device showed it was OFF. Patient states she is not sure how this happened. But this would explain her increase in frequency. Patient's device was turned back on and rescheduled for next week for a recheck and voiding diary.

## 2017-04-06 ENCOUNTER — Ambulatory Visit (INDEPENDENT_AMBULATORY_CARE_PROVIDER_SITE_OTHER): Payer: Medicare Other

## 2017-04-06 VITALS — BP 102/69 | HR 90 | Ht 63.0 in | Wt 195.0 lb

## 2017-04-06 DIAGNOSIS — R3915 Urgency of urination: Secondary | ICD-10-CM

## 2017-04-06 NOTE — Progress Notes (Signed)
Patient present today for recheck on interstim. Box was on today her voiding diary showed 8-16 voids daily with 3-4 urgency. Program was changed to program #3 patient felt stimulation at 1.9AMP. She was told to follow up in 2wks and call before if needed. She will keep a voiding dairy again to note improvement. Other health factors for urgency were discussed today, may discuss further in the future and set up follow up with Dr. Sherron Monday if symptoms do not get better.

## 2017-04-07 ENCOUNTER — Encounter: Payer: Self-pay | Admitting: Family Medicine

## 2017-04-07 ENCOUNTER — Ambulatory Visit (INDEPENDENT_AMBULATORY_CARE_PROVIDER_SITE_OTHER): Payer: Medicare Other | Admitting: Family Medicine

## 2017-04-07 VITALS — BP 120/73 | HR 89 | Temp 98.0°F | Resp 16 | Ht 63.0 in | Wt 194.9 lb

## 2017-04-07 DIAGNOSIS — R7303 Prediabetes: Secondary | ICD-10-CM | POA: Diagnosis not present

## 2017-04-07 DIAGNOSIS — R0981 Nasal congestion: Secondary | ICD-10-CM | POA: Insufficient documentation

## 2017-04-07 DIAGNOSIS — Z23 Encounter for immunization: Secondary | ICD-10-CM

## 2017-04-07 DIAGNOSIS — H44812 Hemophthalmos, left eye: Secondary | ICD-10-CM

## 2017-04-07 LAB — POCT GLYCOSYLATED HEMOGLOBIN (HGB A1C): HEMOGLOBIN A1C: 6.1

## 2017-04-07 MED ORDER — MOMETASONE FUROATE 50 MCG/ACT NA SUSP: 2.0000 | Freq: Every day | NASAL | 0 refills | Status: DC

## 2017-04-07 NOTE — Progress Notes (Signed)
Name: Christina Ware   MRN: [id number]    DOB: Dec 13, 1950   Date:04/07/2017       Progress Note  Subjective  Chief Complaint  Chief Complaint  Patient presents with  . Eye Problem    bleeding in left eye pt states eye physician told her to come see her primary pcp    HPI  Pt. was sent over to [place] by her eye doctor for a general 'check-up' after she found a bleed in the back of her left eye, notes or records are not available. Patient reports the ye doctor wanted to make sure she did not have high blood pressure or diabetes. She reports being in good health, her vitals are stable, denies any blurred vision, chest pain, headache, dyspnea, her last wellness exam was completed in November 2017.   She stays congested, has to constantly clear her throat and spit up the mucus. She has to get up and blow her nose at night and feels one of her nasal cavity stays stopped up.  She has no fevers, cough, some congestion.   Past Medical History:  Diagnosis Date  . Chronic back pain greater than 3 months duration    Degenerative Disc Disease, surgery on lower back.  . Chronic neck pain    Ruptured disc in neck  . Prediabetes     Past Surgical History:  Procedure Laterality Date  . BACK SURGERY  2005   lower back  . FOOT SURGERY Bilateral    twice  . KNEE ARTHROSCOPY Right     Family History  Problem Relation Age of Onset  . Diabetes Mother   . Cancer Mother   . Hypertension Mother   . Diabetes Father   . Cancer Father   . Hypertension Father   . Diabetes Sister   . Hypertension Sister   . Diabetes Brother   . Cancer Brother   . Hypertension Brother   . Diabetes Brother   . Birth defects Brother        born with a hole in his heart  . Lung disease Brother        patient needed a lung transplant  . Heart disease Brother        needed a heart transplant    Social History   Social History  . Marital status: Divorced    Spouse name: N/A  . Number of children: N/A  .  Years of education: N/A   Occupational History  . Not on file.   Social History Main Topics  . Smoking status: Former Games developer  . Smokeless tobacco: Never Used     Comment: quit when she was 66yrs old  . Alcohol use No  . Drug use: No  . Sexual activity: No   Other Topics Concern  . Not on file   Social History Narrative  . No narrative on file     Current Outpatient Prescriptions:  .  Acetylcarnitine HCl (ACETYL L-CARNITINE PO), Take 400 mg by mouth., Disp: , Rfl:  .  Alpha-Lipoic Acid 200 MG TABS, Take by mouth., Disp: , Rfl:  .  Black Cohosh ([facility] BLACK COHOSH) 540 MG CAPS, Take by mouth., Disp: , Rfl:  .  docusate sodium (COLACE) 100 MG capsule, Take by mouth., Disp: , Rfl:  .  fentaNYL (DURAGESIC - DOSED MCG/HR) 75 MCG/HR, Place 50 mcg onto the skin. , Disp: , Rfl:  .  gabapentin (NEURONTIN) 800 MG tablet, , Disp: , Rfl:  .  L-Arginine  500 MG CAPS, Take by mouth., Disp: , Rfl:  .  Omega-3 Fatty Acids (FISH OIL) 1000 MG CAPS, Take by mouth., Disp: , Rfl:  .  Oxycodone HCl 10 MG TABS, , Disp: , Rfl:  .  tiZANidine (ZANAFLEX) 4 MG tablet, Reported on 02/25/2016, Disp: , Rfl:  .  vitamin E 400 UNIT capsule, Take 400 Units by mouth daily. Reported on 02/25/2016, Disp: , Rfl:   Allergies  Allergen Reactions  . Codeine Other (See Comments)  . Hydrocodone   . Latex      Review of Systems  Constitutional: Negative for chills, fever and malaise/fatigue.  Eyes: Negative for blurred vision and double vision.  Respiratory: Negative for cough and shortness of breath.   Cardiovascular: Negative for chest pain and palpitations.  Neurological: Negative for dizziness and headaches.      Objective  Vitals:   04/07/17 0838  BP: 120/73  Pulse: 89  Resp: 16  Temp: 98 F (36.7 C)  TempSrc: Oral  SpO2: 96%  Weight: 194 lb 14.4 oz (88.4 kg)  Height: 5\' 3"  (1.6 m)    Physical Exam  Constitutional: She is oriented to person, place, and time and well-developed, well-nourished,  and in no distress.  HENT:  Head: Normocephalic and atraumatic.  Right Ear: External ear normal.  Left Ear: External ear normal.  Nose: Right sinus exhibits no maxillary sinus tenderness. Left sinus exhibits no maxillary sinus tenderness.  Mouth/Throat: Oropharynx is clear and moist.  Nasal mucosal inflammation, turbinate hypertrophy.  Eyes: Conjunctivae and lids are normal. Right conjunctiva has no hemorrhage. Left conjunctiva is not injected. Left conjunctiva has no hemorrhage.  Cardiovascular: Normal rate, regular rhythm and normal heart sounds.   No murmur heard. Pulmonary/Chest: Effort normal and breath sounds normal. She has no wheezes.  Abdominal: Soft. Bowel sounds are normal. There is no tenderness.  Musculoskeletal:       Right ankle: She exhibits no swelling.       Left ankle: She exhibits no swelling.  Neurological: She is alert and oriented to person, place, and time.  Psychiatric: Mood, memory, affect and judgment normal.  Nursing note and vitals reviewed.     Assessment & Plan  1. Chronic nasal congestion Suspect chronic sinusitis, started on Nasonex for treatment, if no improvement, may consider antibiotics - mometasone (NASONEX) 50 MCG/ACT nasal spray; Place 2 sprays into the nose daily.  Dispense: 17 g; Refill: 0  2. Prediabetes   A1c 6.1%, consistent with prediabetes - POCT glycosylated hemoglobin (Hb A1C)  3. Bleeding of eye, left She is going to follow-up with ophthalmology for further management, no visible bleeding in the examined portion of the eye  4. Need for influenza vaccination  - Flu vaccine HIGH DOSE PF (Fluzone High dose)  5. Need for pneumococcal vaccination  - Pneumococcal conjugate vaccine 13-valent  Osamu Olguin [facility] 04/07/2017 8:54 AM

## 2017-04-20 ENCOUNTER — Ambulatory Visit: Payer: Medicare Other

## 2017-04-28 ENCOUNTER — Ambulatory Visit (INDEPENDENT_AMBULATORY_CARE_PROVIDER_SITE_OTHER): Payer: Medicare Other | Admitting: Urology

## 2017-04-28 ENCOUNTER — Encounter: Payer: Self-pay | Admitting: Urology

## 2017-04-28 VITALS — BP 116/71 | HR 93 | Ht 63.0 in | Wt 197.1 lb

## 2017-04-28 DIAGNOSIS — R351 Nocturia: Secondary | ICD-10-CM | POA: Diagnosis not present

## 2017-04-28 NOTE — Progress Notes (Signed)
04/28/2017 3:04 PM   Christina Ware 1950/11/12 [id number]  Referring provider: Ellyn Hack, MD [address] [place], [place] [address]  Chief Complaint  Patient presents with  . Urinary Incontinence    HPI: The patient has an InterStim device and was here today for reevaluation of especially nighttime frequency. The staff spoke to me about her troubleshooting and impedance checks etc. Recently. My last office note was about 14 months ago.  Last note:  Over a few months she said she could not urinate well. We feel like she could go but could not go. A few days ago she finally turned the InterStim device off and she said she voided much better. Currently her flow was good or poor. She voids 3-4 times an hour and getting up at least 8-10 times a night  On further review of the medical records from October 2015 in noted the device was placed October 2014. Her residual on that day was 94 mL. She had been taught how to self catheterize in the past. It noted that the device was placed for retention. Having said that she had a lot of frequency and nighttime frequency and incontinence than as well.    The patient has flow symptoms and severe frequency and nighttime frequency. She had flow symptoms and cannot urinate or worsening or a few months and she turned the device off. She actually was doing reasonably well prior to this. The patient was scanned today for 93 mL. She was sitting on the toilet for a few hours and finally catheterized at home and describes very little volume. She has had multiple residuals less than 100 mL. She has had Rapaflo in the past. The impedance had been noted to be abnormal in the past on perhaps 2 leads but is re-programmed. At one point in time it was noted that she would void 40 times per day  Today Sarah spent about 45 minutes with the patient recently. She reported increased and nighttime frequency over 3 months. It was felt to the device  was working well in regards to testing and location of sensation.   Recently I reviewed multiple days of the diary in August and early September and the patient would get up 3-5 times a night.  The patient turned her device up to level V and slept well for the last 2 nights. She does feel it between the vagina and rectum for about 1 hour and then she stopped feeling that due to accommodation. She is voiding about every 1-2 hours at night and every few hours during the day ranging between 10-14 times. She has no ankle edema and does not take Lasix or steroids Modifying factors: There are no other modifying factors  Associated signs and symptoms: There are no other associated signs and symptoms Aggravating and relieving factors: There are no other aggravating or relieving factors Severity: Moderate Duration: Persistent  PMH: Past Medical History:  Diagnosis Date  . Chronic back pain greater than 3 months duration    Degenerative Disc Disease, surgery on lower back.  . Chronic neck pain    Ruptured disc in neck  . Prediabetes     Surgical History: Past Surgical History:  Procedure Laterality Date  . BACK SURGERY  2005   lower back  . FOOT SURGERY Bilateral    twice  . KNEE ARTHROSCOPY Right     Home Medications:  Allergies as of 04/28/2017      Reactions  Codeine Other (See Comments)   Hydrocodone    Latex       Medication List       Accurate as of 04/28/17  3:04 PM. Always use your most recent med list.          ACETYL L-CARNITINE PO Take 400 mg by mouth.   Alpha-Lipoic Acid 200 MG Tabs Take by mouth.   [facility]S BLACK COHOSH 540 MG Caps Generic drug:  Black Cohosh Take by mouth.   docusate sodium 100 MG capsule Commonly known as:  COLACE Take by mouth.   fentaNYL 75 MCG/HR Commonly known as:  DURAGESIC - dosed mcg/hr Place 50 mcg onto the skin.   Fish Oil 1000 MG Caps Take by mouth.   gabapentin 800 MG tablet Commonly known as:  NEURONTIN   L-Arginine  500 MG Caps Take by mouth.   mometasone 50 MCG/ACT nasal spray Commonly known as:  NASONEX Place 2 sprays into the nose daily.   Oxycodone HCl 10 MG Tabs   tiZANidine 4 MG tablet Commonly known as:  ZANAFLEX Reported on 02/25/2016   vitamin E 400 UNIT capsule Take 400 Units by mouth daily. Reported on 02/25/2016            Discharge Care Instructions        Start     Ordered   04/28/17 0000  Basic metabolic panel     04/28/17 1502      Allergies:  Allergies  Allergen Reactions  . Codeine Other (See Comments)  . Hydrocodone   . Latex     Family History: Family History  Problem Relation Age of Onset  . Diabetes Mother   . Cancer Mother   . Hypertension Mother   . Diabetes Father   . Cancer Father   . Hypertension Father   . Diabetes Sister   . Hypertension Sister   . Diabetes Brother   . Cancer Brother   . Hypertension Brother   . Diabetes Brother   . Birth defects Brother        born with a hole in his heart  . Lung disease Brother        patient needed a lung transplant  . Heart disease Brother        needed a heart transplant    Social History:  reports that she has quit smoking. She has never used smokeless tobacco. She reports that she does not drink alcohol or use drugs.  ROS: UROLOGY Frequent Urination?: Yes Hard to postpone urination?: No Burning/pain with urination?: No Get up at night to urinate?: Yes Leakage of urine?: No Urine stream starts and stops?: No Trouble starting stream?: No Do you have to strain to urinate?: No Blood in urine?: No Urinary tract infection?: No Sexually transmitted disease?: No Injury to kidneys or bladder?: No Painful intercourse?: No Weak stream?: No Currently pregnant?: No Vaginal bleeding?: No Last menstrual period?: n  Gastrointestinal Nausea?: No Vomiting?: No Indigestion/heartburn?: No Diarrhea?: No Constipation?: No  Constitutional Fever: Yes Night sweats?: Yes Weight loss?:  No Fatigue?: No  Skin Skin rash/lesions?: No Itching?: No  Eyes Blurred vision?: No Double vision?: No  Ears/Nose/Throat Sore throat?: No Sinus problems?: No  Hematologic/Lymphatic Swollen glands?: No Easy bruising?: No  Cardiovascular Leg swelling?: No Chest pain?: No  Respiratory Cough?: No Shortness of breath?: No  Endocrine Excessive thirst?: Yes  Musculoskeletal Back pain?: Yes Joint pain?: No  Neurological Headaches?: No Dizziness?: No  Psychologic Depression?: No Anxiety?: No  Physical  Exam: BP 116/71 (BP Location: Left Arm, Patient Position: Sitting, Cuff Size: Normal)   Pulse 93   Ht  (1.6 m)   Wt 197 lb 1.6 oz (89.4 kg)   BMI 34.91 kg/m   Constitutional:  Alert and oriented, No acute distress.   Laboratory Data: Lab Results  Component Value Date   WBC 11.1 (H) 10/17/2016   HGB 14.1 10/17/2016   HCT 41.9 10/17/2016   MCV 92.5 10/17/2016   PLT 274 10/17/2016    Lab Results  Component Value Date   CREATININE 0.75 10/17/2016    No results found for: PSA  No results found for: TESTOSTERONE  Lab Results  Component Value Date   HGBA1C 6.1 04/07/2017    Urinalysis    Component Value Date/Time   COLORURINE YELLOW (A) 10/17/2016 1540   APPEARANCEUR Cloudy (A) 03/23/2017 1402   LABSPEC 1.020 10/17/2016 1540   LABSPEC 1.020 05/14/2012 1702   PHURINE 6.0 10/17/2016 1540   GLUCOSEU Negative 03/23/2017 1402   GLUCOSEU Negative 05/14/2012 1702   HGBUR SMALL (A) 10/17/2016 1540   BILIRUBINUR Negative 03/23/2017 1402   BILIRUBINUR Negative 05/14/2012 1702   KETONESUR 5 (A) 10/17/2016 1540   PROTEINUR Trace (A) 03/23/2017 1402   PROTEINUR NEGATIVE 10/17/2016 1540   UROBILINOGEN 0.2 09/04/2015 1338   UROBILINOGEN 0.2 05/17/2009 1634   NITRITE Negative 03/23/2017 1402   NITRITE NEGATIVE 10/17/2016 1540   LEUKOCYTESUR 1+ (A) 03/23/2017 1402   LEUKOCYTESUR Negative 05/14/2012 1702    Pertinent Imaging: none  Assessment &  Plan:  Based upon baseline severity the patient actually has done quite well with InterStim. There has been a lot of encounters and I think it would be best to the patient can have reasonable treatment goals. Good days and bad days was discussed. I talked her about DDAVP no spray with pros cons risks and black box and my usual protocol. Hopefully this can greatly improve her nighttime frequency overnight above the InterStim. Reassess as per protocol    There are no diagnoses linked to this encounter.  No Follow-up on file.  Martina Sinner, MD  [facility] [address] [place], [place] [address] [phone]

## 2017-04-29 ENCOUNTER — Telehealth: Payer: Self-pay | Admitting: Family Medicine

## 2017-04-29 LAB — BASIC METABOLIC PANEL
BUN/Creatinine Ratio: 23 (ref 12–28)
BUN: 18 mg/dL (ref 8–27)
CALCIUM: 9.5 mg/dL (ref 8.7–10.3)
CO2: 20 mmol/L (ref 20–29)
CREATININE: 0.77 mg/dL (ref 0.57–1.00)
Chloride: 103 mmol/L (ref 96–106)
GFR calc Af Amer: 94 mL/min/{1.73_m2} (ref >59)
GFR, EST NON AFRICAN AMERICAN: 81 mL/min/{1.73_m2} (ref >59)
Glucose: 101 mg/dL — ABNORMAL HIGH (ref 65–99)
POTASSIUM: 4.2 mmol/L (ref 3.5–5.2)
Sodium: 140 mmol/L (ref 134–144)

## 2017-04-29 MED ORDER — DESMOPRESSIN ACETATE 0.83 MCG/0.1ML NA EMUL: 0.8300 ug | Freq: Every day | NASAL | 2 refills | Status: DC

## 2017-04-29 NOTE — Addendum Note (Signed)
Addended by: Honor Loh on: 04/29/2017 04:55 PM   Modules accepted: Orders

## 2017-04-29 NOTE — Telephone Encounter (Signed)
-----   Message from Alfredo Martinez, MD sent at 04/29/2017  3:07 PM EDT ----- Normal labs Call her and have her start the Noctiva prescribed   ----- Message ----- From: Honor Loh, CMA Sent: 04/29/2017   1:38 PM To: Alfredo Martinez, MD    ----- Message ----- From: Interface, [facility] Lab Results In Sent: 04/29/2017   5:40 AM To: Jennette Kettle Clinical

## 2017-04-29 NOTE — Telephone Encounter (Signed)
RX sent to [facility] pharmacy, patient notified. Patient voiced understanding

## 2017-05-04 DIAGNOSIS — H3562 Retinal hemorrhage, left eye: Secondary | ICD-10-CM | POA: Diagnosis not present

## 2017-05-06 DIAGNOSIS — G894 Chronic pain syndrome: Secondary | ICD-10-CM | POA: Diagnosis not present

## 2017-05-06 DIAGNOSIS — M961 Postlaminectomy syndrome, not elsewhere classified: Secondary | ICD-10-CM | POA: Diagnosis not present

## 2017-05-06 DIAGNOSIS — Z79891 Long term (current) use of opiate analgesic: Secondary | ICD-10-CM | POA: Diagnosis not present

## 2017-05-07 ENCOUNTER — Other Ambulatory Visit: Payer: Self-pay | Admitting: Family Medicine

## 2017-05-07 DIAGNOSIS — R351 Nocturia: Secondary | ICD-10-CM

## 2017-05-14 ENCOUNTER — Other Ambulatory Visit: Payer: Medicare Other

## 2017-05-14 DIAGNOSIS — R351 Nocturia: Secondary | ICD-10-CM

## 2017-05-15 LAB — SODIUM: Sodium: 141 mmol/L (ref 134–144)

## 2017-06-03 ENCOUNTER — Ambulatory Visit (INDEPENDENT_AMBULATORY_CARE_PROVIDER_SITE_OTHER): Payer: Medicare Other | Admitting: Urology

## 2017-06-03 ENCOUNTER — Encounter: Payer: Self-pay | Admitting: Urology

## 2017-06-03 ENCOUNTER — Ambulatory Visit: Payer: Medicare Other

## 2017-06-03 VITALS — BP 100/70 | HR 84 | Ht 63.0 in | Wt 195.2 lb

## 2017-06-03 DIAGNOSIS — R351 Nocturia: Secondary | ICD-10-CM

## 2017-06-03 MED ORDER — DESMOPRESSIN ACETATE 1.66 MCG/0.1ML NA EMUL: 1.0000 | Freq: Every day | NASAL | 3 refills | Status: DC

## 2017-06-03 NOTE — Progress Notes (Signed)
06/03/2017 3:00 PM   Christina Ware 1950-12-23 [id number]  Referring provider: Roselee Nova, MD [address] [place], [place] [address]  Chief Complaint  Patient presents with  . Nocturia    HPI: The patient describes an InterStim placed by Dr. Elnoria Howard a number of years ago for severe frequency. She said she was not emptying well. Sometimes the details of her history difficult to ascertain  Over a few months she said she could not urinate well. We feel like she could explore but could not go. A few days ago she finally turned the InterStim device off and she said she voided much better. Currently her flow was good or poor. She voids 3-4 times an hour and getting up at least 8-10 times a night  On further review of the medical records from October 2015 in noted the device was placed October 2014. Her residual on that day was 94 mL. She had been taught how to self catheterize in the past. It noted that the device was placed for retention. Having said that she had a lot of frequency and nighttime frequency and incontinence than as well.  The patient has flow symptoms and severe frequency and nighttime frequency. She had flow symptoms and cannot urinate or worsening or a few months and she turned the device off. She actually was doing reasonably well prior to this. The patient was scanned today for 93 mL. She was sitting on the toilet for a few hours and finally catheterized at home and describes very little volume.  She appears to be scribing bladder overactivity with the associated feeling of retention and incomplete bladder emptying and the patient predisposed to this symptom.   The nurses looked into her notes and appears at the residuals are always less than 100 mL and she did void up to 40 times a day. This supports an overactive bladder diagnosis and possibly now a malfunctioning InterStim  The patient's impedance was abnormal in the least 2 leads. The  [employer] representative reprogram the device around these positions We will reassess the patient 8 weeks. If the settings are reach the patient's treatment goal one could consider replacement  Today In the last 3 months the patient is voiding more frequently at night. She can void every 20 minutes. She denies ankle edema. Her daytime frequency is still every 4 or 5 hours. Clinically she is not infected. She has tried turning up the device and she feels it more rectal. She has tried different settings. When it was working differently she will get up twice a night  She does feel the device protruding a little bit in the right mid or upper buttock and and can brush again things in a tight situation  On physical examination there was no tenderness. The device was protruding a little bit medially. The device is located in the right mid or upper buttock.  Urine did not look infected but is sent for culture. Judson Roch did some trouble shooting and again there was some impedance issues that I believe normalized as she turned up the amplitude. She did some different settings. The patient will keep a diary and be reassessed by Judson Roch next week. Will will proceed accordingly.   Today Patient on DDAVP The patient thinks the desmopressin may have helped minimally.  Sodium is normal.  She is on low dose.  She kept a diary and she can void 8-17 times a day and gets up 6 or less times at  night.  Overall she has done very well with her frequency and nocturia but I find that her treatment goals are very high and may not be able to be met.  I went over Botox in detail with my usual template.   PMH: Past Medical History:  Diagnosis Date  . Chronic back pain greater than 3 months duration    Degenerative Disc Disease, surgery on lower back.  . Chronic neck pain    Ruptured disc in neck  . Prediabetes     Surgical History: Past Surgical History:  Procedure Laterality Date  . BACK SURGERY  2005   lower  back  . FOOT SURGERY Bilateral    twice  . KNEE ARTHROSCOPY Right     Home Medications:  Allergies as of 06/03/2017      Reactions   Codeine Other (See Comments)   Hydrocodone    Latex       Medication List       Accurate as of 06/03/17  3:00 PM. Always use your most recent med list.          ACETYL L-CARNITINE PO Take 400 mg by mouth.   Alpha-Lipoic Acid 200 MG Tabs Take by mouth.   [facility]S BLACK COHOSH 540 MG Caps Generic drug:  Black Cohosh Take by mouth.   Desmopressin Acetate 0.83 MCG/0.1ML Emul Commonly known as:  NOCTIVA Place 0.83 mcg into the nose daily. 1 spray 30 minutes before bed.   docusate sodium 100 MG capsule Commonly known as:  COLACE Take by mouth.   fentaNYL 75 MCG/HR Commonly known as:  DURAGESIC - dosed mcg/hr Place 50 mcg onto the skin.   Fish Oil 1000 MG Caps Take by mouth.   gabapentin 800 MG tablet Commonly known as:  NEURONTIN   L-Arginine 500 MG Caps Take by mouth.   mometasone 50 MCG/ACT nasal spray Commonly known as:  NASONEX Place 2 sprays into the nose daily.   Oxycodone HCl 10 MG Tabs   tiZANidine 4 MG tablet Commonly known as:  ZANAFLEX Reported on 02/25/2016   vitamin E 400 UNIT capsule Take 400 Units by mouth daily. Reported on 02/25/2016       Allergies:  Allergies  Allergen Reactions  . Codeine Other (See Comments)  . Hydrocodone   . Latex     Family History: Family History  Problem Relation Age of Onset  . Diabetes Mother   . Cancer Mother   . Hypertension Mother   . Diabetes Father   . Cancer Father   . Hypertension Father   . Diabetes Sister   . Hypertension Sister   . Diabetes Brother   . Cancer Brother   . Hypertension Brother   . Diabetes Brother   . Birth defects Brother        born with a hole in his heart  . Lung disease Brother        patient needed a lung transplant  . Heart disease Brother        needed a heart transplant    Social History:  reports that she has quit  smoking. She has never used smokeless tobacco. She reports that she does not drink alcohol or use drugs.  ROS: UROLOGY Frequent Urination?: Yes Hard to postpone urination?: Yes Burning/pain with urination?: No Get up at night to urinate?: Yes Leakage of urine?: No Urine stream starts and stops?: No Trouble starting stream?: No Do you have to strain to urinate?: Yes Blood in urine?: No Urinary  tract infection?: No Sexually transmitted disease?: No Injury to kidneys or bladder?: No Painful intercourse?: No Weak stream?: No Currently pregnant?: No Vaginal bleeding?: No Last menstrual period?: n  Gastrointestinal Nausea?: No Vomiting?: No Indigestion/heartburn?: No Diarrhea?: No Constipation?: No  Constitutional Fever: No Night sweats?: No Weight loss?: No Fatigue?: No  Skin Skin rash/lesions?: No Itching?: No  Eyes Blurred vision?: No Double vision?: No  Ears/Nose/Throat Sore throat?: No Sinus problems?: Yes  Hematologic/Lymphatic Swollen glands?: No Easy bruising?: No  Cardiovascular Leg swelling?: No Chest pain?: No  Respiratory Cough?: No Shortness of breath?: No  Endocrine Excessive thirst?: No  Musculoskeletal Back pain?: Yes Joint pain?: No  Neurological Headaches?: No Dizziness?: No  Psychologic Depression?: No Anxiety?: No  Physical Exam: BP 100/70 (BP Location: Right Arm, Patient Position: Sitting, Cuff Size: Large)   Pulse 84   Ht 5' 3"  (1.6 m)   Wt 195 lb 3.2 oz (88.5 kg)   BMI 34.58 kg/m     Laboratory Data: Lab Results  Component Value Date   WBC 11.1 (H) 10/17/2016   HGB 14.1 10/17/2016   HCT 41.9 10/17/2016   MCV 92.5 10/17/2016   PLT 274 10/17/2016    Lab Results  Component Value Date   CREATININE 0.77 04/28/2017    No results found for: PSA  No results found for: TESTOSTERONE  Lab Results  Component Value Date   HGBA1C 6.1 04/07/2017    Urinalysis    Component Value Date/Time   COLORURINE  YELLOW (A) 10/17/2016 1540   APPEARANCEUR Cloudy (A) 03/23/2017 1402   LABSPEC 1.020 10/17/2016 1540   LABSPEC 1.020 05/14/2012 1702   PHURINE 6.0 10/17/2016 1540   GLUCOSEU Negative 03/23/2017 1402   GLUCOSEU Negative 05/14/2012 1702   HGBUR SMALL (A) 10/17/2016 1540   BILIRUBINUR Negative 03/23/2017 1402   BILIRUBINUR Negative 05/14/2012 1702   KETONESUR 5 (A) 10/17/2016 1540   PROTEINUR Trace (A) 03/23/2017 1402   PROTEINUR NEGATIVE 10/17/2016 1540   UROBILINOGEN 0.2 09/04/2015 1338   UROBILINOGEN 0.2 05/17/2009 1634   NITRITE Negative 03/23/2017 1402   NITRITE NEGATIVE 10/17/2016 1540   LEUKOCYTESUR 1+ (A) 03/23/2017 1402   LEUKOCYTESUR Negative 05/14/2012 1702    Pertinent Imaging: None  Assessment & Plan: The patient will think about Botox but would like to try the higher dose desmopressin prescribed.  Reevaluate in about a month.  I do not believe that any adjustments or changing of InterStim would make a difference  There are no diagnoses linked to this encounter.  No Follow-up on file.  Reece Packer, MD  [facility] [address], [place], [place] [address] [phone]

## 2017-06-17 ENCOUNTER — Other Ambulatory Visit: Payer: Medicare Other

## 2017-06-23 ENCOUNTER — Other Ambulatory Visit: Payer: Medicare Other

## 2017-06-24 ENCOUNTER — Other Ambulatory Visit: Payer: Medicare Other

## 2017-06-24 DIAGNOSIS — R351 Nocturia: Secondary | ICD-10-CM

## 2017-06-25 LAB — SODIUM: Sodium: 140 mmol/L (ref 134–144)

## 2017-07-01 ENCOUNTER — Telehealth: Payer: Self-pay

## 2017-07-01 NOTE — Telephone Encounter (Signed)
Christina Ware, Scott, MD  Skeet LatchWatkins, Chelsea C, LPN        Normal Na    Will send a letter.

## 2017-07-06 ENCOUNTER — Encounter: Payer: Self-pay | Admitting: Urology

## 2017-07-06 ENCOUNTER — Ambulatory Visit (INDEPENDENT_AMBULATORY_CARE_PROVIDER_SITE_OTHER): Payer: Medicare Other | Admitting: Urology

## 2017-07-06 VITALS — BP 124/77 | HR 92 | Ht 63.0 in | Wt 204.0 lb

## 2017-07-06 DIAGNOSIS — R351 Nocturia: Secondary | ICD-10-CM

## 2017-07-06 DIAGNOSIS — R3915 Urgency of urination: Secondary | ICD-10-CM | POA: Diagnosis not present

## 2017-07-06 MED ORDER — CIPROFLOXACIN HCL 500 MG PO TABS: 500.0000 mg | ORAL_TABLET | Freq: Two times a day (BID) | ORAL | 0 refills | Status: DC

## 2017-07-06 NOTE — Progress Notes (Signed)
07/06/2017 2:48 PM   Christina Ware Aug 01, 1951 [id number]  Referring provider: Roselee Nova, MD [address] [place], [place] [address]  Chief Complaint  Patient presents with  . Nocturia    HPI: Read lasts long note:  The nurses looked into her notes and appears at the residuals are always less than 100 mL and she did void up to 40 times a day. This supports an overactive bladder diagnosis and possibly now a malfunctioning InterStim  The patient's impedance was abnormal in the least 2 leads. The [employer] representative reprogram the device around these positions We will reassess the patient 8 weeks. If the settings are reach the patient's treatment goal one could consider replacement   In the last 3 months the patient is voiding more frequently at night. She can void every 20 minutes. She denies ankle edema. Her daytime frequency is still every 4 or 5 hours. Clinically she is not infected. She has tried turning up the device and she feels it more rectal. She has tried different settings. When it was working differently she will get up twice a night  She does feel the device protruding a little bit in the right mid or upper buttock and and canbrush again things in a tight situation  On physical examination there was no tenderness. The device was protruding a little bit medially. The device is located in the right mid or upper buttock.  Patient on DDAVP The patient thinks the desmopressin may have helped minimally.  Sodium is normal.  She is on low dose.  She kept a diary and she can void 8-17 times a day and gets up 6 or less times at night.  Overall she has done very well with her frequency and nocturia but I find that her treatment goals are very high and may not be able to be met.  I went over Botox in detail with my usual template.  The patient will think about Botox but would like to try the higher dose desmopressin prescribed.  Reevaluate in  about a month.  I do not believe that any adjustments or changing of InterStim would make a difference  Today The patient once again kept a diary and she voids 8-14 times a day and many times it is 14 and she tends to get up for 5 times a night.  The patient is still hoping to improve her outcome and she finds the urgency while shopping very bothersome.  She would really like to proceed with Botox     PMH: Past Medical History:  Diagnosis Date  . Chronic back pain greater than 3 months duration    Degenerative Disc Disease, surgery on lower back.  . Chronic neck pain    Ruptured disc in neck  . Prediabetes     Surgical History: Past Surgical History:  Procedure Laterality Date  . BACK SURGERY  2005   lower back  . FOOT SURGERY Bilateral    twice  . KNEE ARTHROSCOPY Right     Home Medications:  Allergies as of 07/06/2017      Reactions   Codeine Other (See Comments)   Hydrocodone    Latex       Medication List        Accurate as of 07/06/17  2:48 PM. Always use your most recent med list.          ACETYL L-CARNITINE PO Take 400 mg by mouth.   Alpha-Lipoic Acid 200 MG  Tabs Take by mouth.   [facility] BLACK COHOSH 540 MG Caps Generic drug:  Black Cohosh Take by mouth.   Desmopressin Acetate 1.66 MCG/0.1ML Emul Commonly known as:  NOCTIVA Place 1 spray into the nose at bedtime.   docusate sodium 100 MG capsule Commonly known as:  COLACE Take by mouth.   fentaNYL 75 MCG/HR Commonly known as:  DURAGESIC - dosed mcg/hr Place 50 mcg onto the skin.   Fish Oil 1000 MG Caps Take by mouth.   gabapentin 800 MG tablet Commonly known as:  NEURONTIN   L-Arginine 500 MG Caps Take by mouth.   mometasone 50 MCG/ACT nasal spray Commonly known as:  NASONEX Place 2 sprays into the nose daily.   Oxycodone HCl 10 MG Tabs   tiZANidine 4 MG tablet Commonly known as:  ZANAFLEX Reported on 02/25/2016   vitamin E 400 UNIT capsule Take 400 Units by mouth daily. Reported  on 02/25/2016       Allergies:  Allergies  Allergen Reactions  . Codeine Other (See Comments)  . Hydrocodone   . Latex     Family History: Family History  Problem Relation Age of Onset  . Diabetes Mother   . Cancer Mother   . Hypertension Mother   . Diabetes Father   . Cancer Father   . Hypertension Father   . Diabetes Sister   . Hypertension Sister   . Diabetes Brother   . Cancer Brother   . Hypertension Brother   . Diabetes Brother   . Birth defects Brother        born with a hole in his heart  . Lung disease Brother        patient needed a lung transplant  . Heart disease Brother        needed a heart transplant    Social History:  reports that she has quit smoking. she has never used smokeless tobacco. She reports that she does not drink alcohol or use drugs.  ROS: UROLOGY Frequent Urination?: Yes Hard to postpone urination?: No Burning/pain with urination?: No Get up at night to urinate?: Yes Leakage of urine?: No Urine stream starts and stops?: No Trouble starting stream?: No Do you have to strain to urinate?: No Blood in urine?: No Urinary tract infection?: No Sexually transmitted disease?: No Injury to kidneys or bladder?: No Painful intercourse?: No Weak stream?: No Currently pregnant?: No Vaginal bleeding?: No Last menstrual period?: n  Gastrointestinal Nausea?: No Vomiting?: No Indigestion/heartburn?: No Diarrhea?: No Constipation?: No  Constitutional Fever: No Night sweats?: Yes Weight loss?: No Fatigue?: No  Skin Skin rash/lesions?: Yes Itching?: Yes  Eyes Blurred vision?: No Double vision?: No  Ears/Nose/Throat Sore throat?: No Sinus problems?: No  Hematologic/Lymphatic Swollen glands?: No Easy bruising?: No  Cardiovascular Leg swelling?: No Chest pain?: No  Respiratory Cough?: No Shortness of breath?: No  Endocrine Excessive thirst?: Yes  Musculoskeletal Back pain?: Yes Joint pain?:  No  Neurological Headaches?: No Dizziness?: No  Psychologic Depression?: No Anxiety?: No  Physical Exam: BP 124/77 (BP Location: Right Arm, Patient Position: Sitting, Cuff Size: Normal)   Pulse 92   Ht _0  (1.6 m)   Wt 204 lb (92.5 kg)   BMI 36.14 kg/m   Constitutional:  Alert and oriented, No acute distress.  Laboratory Data: Lab Results  Component Value Date   WBC 11.1 (H) 10/17/2016   HGB 14.1 10/17/2016   HCT 41.9 10/17/2016   MCV 92.5 10/17/2016   PLT 274 10/17/2016  Lab Results  Component Value Date   CREATININE 0.77 04/28/2017    No results found for: PSA  No results found for: TESTOSTERONE  Lab Results  Component Value Date   HGBA1C 6.1 04/07/2017    Urinalysis    Component Value Date/Time   COLORURINE YELLOW (A) 10/17/2016 1540   APPEARANCEUR Cloudy (A) 03/23/2017 1402   LABSPEC 1.020 10/17/2016 1540   LABSPEC 1.020 05/14/2012 1702   PHURINE 6.0 10/17/2016 1540   GLUCOSEU Negative 03/23/2017 1402   GLUCOSEU Negative 05/14/2012 1702   HGBUR SMALL (A) 10/17/2016 1540   BILIRUBINUR Negative 03/23/2017 1402   BILIRUBINUR Negative 05/14/2012 1702   KETONESUR 5 (A) 10/17/2016 1540   PROTEINUR Trace (A) 03/23/2017 1402   PROTEINUR NEGATIVE 10/17/2016 1540   UROBILINOGEN 0.2 09/04/2015 1338   UROBILINOGEN 0.2 05/17/2009 1634   NITRITE Negative 03/23/2017 1402   NITRITE NEGATIVE 10/17/2016 1540   LEUKOCYTESUR 1+ (A) 03/23/2017 1402   LEUKOCYTESUR Negative 05/14/2012 1702    Pertinent Imaging: None  Assessment & Plan: The patient has very high treatment goals.  I think in spite of this the Botox is a good option.  She will be scheduled with 100 units as per protocol and get a urine culture 7-10 days prior  There are no diagnoses linked to this encounter.  No Follow-up on file.  Reece Packer, MD  [facility] [address] [place], [place] [address] [phone]

## 2017-07-08 ENCOUNTER — Encounter: Payer: Self-pay | Admitting: Family Medicine

## 2017-07-08 ENCOUNTER — Ambulatory Visit: Payer: Medicare Other | Admitting: Family Medicine

## 2017-07-08 ENCOUNTER — Ambulatory Visit (INDEPENDENT_AMBULATORY_CARE_PROVIDER_SITE_OTHER): Payer: Medicare Other

## 2017-07-08 ENCOUNTER — Telehealth: Payer: Self-pay | Admitting: Family Medicine

## 2017-07-08 VITALS — BP 124/80 | HR 92 | Temp 98.3°F | Resp 16 | Ht 63.0 in | Wt 202.2 lb

## 2017-07-08 VITALS — BP 124/80 | HR 92 | Temp 98.3°F | Resp 16 | Wt 202.2 lb

## 2017-07-08 DIAGNOSIS — Z1159 Encounter for screening for other viral diseases: Secondary | ICD-10-CM | POA: Diagnosis not present

## 2017-07-08 DIAGNOSIS — R7303 Prediabetes: Secondary | ICD-10-CM

## 2017-07-08 DIAGNOSIS — F4321 Adjustment disorder with depressed mood: Secondary | ICD-10-CM

## 2017-07-08 DIAGNOSIS — Z114 Encounter for screening for human immunodeficiency virus [HIV]: Secondary | ICD-10-CM

## 2017-07-08 DIAGNOSIS — Z0000 Encounter for general adult medical examination without abnormal findings: Secondary | ICD-10-CM | POA: Diagnosis not present

## 2017-07-08 LAB — POCT GLYCOSYLATED HEMOGLOBIN (HGB A1C): Hemoglobin A1C: 5.6

## 2017-07-08 MED ORDER — PAROXETINE HCL 20 MG PO TABS: 20.0000 mg | ORAL_TABLET | Freq: Every day | ORAL | 0 refills | Status: DC

## 2017-07-08 NOTE — Patient Instructions (Signed)
[name] , Thank you for taking time to come for your Medicare Wellness Visit. I appreciate your ongoing commitment to your health goals. Please review the following plan we discussed and let me know if I can assist you in the future.   Screening recommendations/referrals: Colonoscopy: Cologuard completed 06/30/16. Repeat colon cancer screening every 3 years Mammogram: Completed 10/02/16. Repeat mammogram in one year Bone Density: Completed 10/01/16. Repeat osteoporotic screenings no longer required Recommended yearly ophthalmology/optometry visit for glaucoma screening and checkup Recommended yearly dental visit for hygiene and checkup  Vaccinations: Influenza vaccine: Completed 04/07/17 Pneumococcal vaccine: Completed PCV13 04/07/17. PPSV23 required after 04/07/18 Tdap vaccine: Declined. Please call your insurance company to determine your out of pocket expense. Shingles vaccine: Declined. Please call your insurance company to determine your out of pocket expense.  Advanced directives: Please bring a copy of your POA (Power of Attorney) and/or Living Will to your next appointment.   Conditions/risks identified: Recommended to eat 3 healthy meals per day with at least 2 healthy snacks  Next appointment: You are scheduled to see Dr. Sherryll Burger on 07/08/17 @ 2:00pm.   Please schedule your annual wellness exam with your Nurse Health Advisor in one year.  Preventive Care 29 Years and Older, Female Preventive care refers to lifestyle choices and visits with your health care provider that can promote health and wellness. What does preventive care include?  A yearly physical exam. This is also called an annual well check.  Dental exams once or twice a year.  Routine eye exams. Ask your health care provider how often you should have your eyes checked.  Personal lifestyle choices, including:  Daily care of your teeth and gums.  Regular physical activity.  Eating a healthy diet.  Avoiding  tobacco and drug use.  Limiting alcohol use.  Practicing safe sex.  Taking low-dose aspirin every day.  Taking vitamin and mineral supplements as recommended by your health care provider. What happens during an annual well check? The services and screenings done by your health care provider during your annual well check will depend on your age, overall health, lifestyle risk factors, and family history of disease. Counseling  Your health care provider may ask you questions about your:  Alcohol use.  Tobacco use.  Drug use.  Emotional well-being.  Home and relationship well-being.  Sexual activity.  Eating habits.  History of falls.  Memory and ability to understand (cognition).  Work and work Astronomer.  Reproductive health. Screening  You may have the following tests or measurements:  Height, weight, and BMI.  Blood pressure.  Lipid and cholesterol levels. These may be checked every 5 years, or more frequently if you are over 51 years old.  Skin check.  Lung cancer screening. You may have this screening every year starting at age 29 if you have a 30-pack-year history of smoking and currently smoke or have quit within the past 15 years.  Fecal occult blood test (FOBT) of the stool. You may have this test every year starting at age 68.  Flexible sigmoidoscopy or colonoscopy. You may have a sigmoidoscopy every 5 years or a colonoscopy every 10 years starting at age 77.  Hepatitis C blood test.  Hepatitis B blood test.  Sexually transmitted disease (STD) testing.  Diabetes screening. This is done by checking your blood sugar (glucose) after you have not eaten for a while (fasting). You may have this done every 1-3 years.  Bone density scan. This is done to screen for osteoporosis. You  may have this done starting at age 65.  Mammogram. This may be done every 1-2 years. Talk to your health care provider about how often you should have regular  mammograms. Talk with your health care provider about your test results, treatment options, and if necessary, the need for more tests. Vaccines  Your health care provider may recommend certain vaccines, such as:  Influenza vaccine. This is recommended every year.  Tetanus, diphtheria, and acellular pertussis (Tdap, Td) vaccine. You may need a Td booster every 10 years.  Zoster vaccine. You may need this after age 65.  Pneumococcal 13-valent conjugate (PCV13) vaccine. One dose is recommended after age 65.  Pneumococcal polysaccharide (PPSV23) vaccine. One dose is recommended after age 65. Talk to your health care provider about which screenings and vaccines you need and how often you need them. This information is not intended to replace advice given to you by your health care provider. Make sure you discuss any questions you have with your health care provider. Document Released: 08/24/2015 Document Revised: 04/16/2016 Document Reviewed: 05/29/2015 Elsevier Interactive Patient Education  2017 [employer]  Fall Prevention in the Home Falls can cause injuries. They can happen to people of all ages. There are many things you can do to make your home safe and to help prevent falls. What can I do on the outside of my home?  Regularly fix the edges of walkways and driveways and fix any cracks.  Remove anything that might make you trip as you walk through a door, such as a raised step or threshold.  Trim any bushes or trees on the path to your home.  Use bright outdoor lighting.  Clear any walking paths of anything that might make someone trip, such as rocks or tools.  Regularly check to see if handrails are loose or broken. Make sure that both sides of any steps have handrails.  Any raised decks and porches should have guardrails on the edges.  Have any leaves, snow, or ice cleared regularly.  Use sand or salt on walking paths during winter.  Clean up any spills in your garage  right away. This includes oil or grease spills. What can I do in the bathroom?  Use night lights.  Install grab bars by the toilet and in the tub and shower. Do not use towel bars as grab bars.  Use non-skid mats or decals in the tub or shower.  If you need to sit down in the shower, use a plastic, non-slip stool.  Keep the floor dry. Clean up any water that spills on the floor as soon as it happens.  Remove soap buildup in the tub or shower regularly.  Attach bath mats securely with double-sided non-slip rug tape.  Do not have throw rugs and other things on the floor that can make you trip. What can I do in the bedroom?  Use night lights.  Make sure that you have a light by your bed that is easy to reach.  Do not use any sheets or blankets that are too big for your bed. They should not hang down onto the floor.  Have a firm chair that has side arms. You can use this for support while you get dressed.  Do not have throw rugs and other things on the floor that can make you trip. What can I do in the kitchen?  Clean up any spills right away.  Avoid walking on wet floors.  Keep items that you use a lot  in easy-to-reach places.  If you need to reach something above you, use a strong step stool that has a grab bar.  Keep electrical cords out of the way.  Do not use floor polish or wax that makes floors slippery. If you must use wax, use non-skid floor wax.  Do not have throw rugs and other things on the floor that can make you trip. What can I do with my stairs?  Do not leave any items on the stairs.  Make sure that there are handrails on both sides of the stairs and use them. Fix handrails that are broken or loose. Make sure that handrails are as long as the stairways.  Check any carpeting to make sure that it is firmly attached to the stairs. Fix any carpet that is loose or worn.  Avoid having throw rugs at the top or bottom of the stairs. If you do have throw rugs,  attach them to the floor with carpet tape.  Make sure that you have a light switch at the top of the stairs and the bottom of the stairs. If you do not have them, ask someone to add them for you. What else can I do to help prevent falls?  Wear shoes that:  Do not have high heels.  Have rubber bottoms.  Are comfortable and fit you well.  Are closed at the toe. Do not wear sandals.  If you use a stepladder:  Make sure that it is fully opened. Do not climb a closed stepladder.  Make sure that both sides of the stepladder are locked into place.  Ask someone to hold it for you, if possible.  Clearly mark and make sure that you can see:  Any grab bars or handrails.  First and last steps.  Where the edge of each step is.  Use tools that help you move around (mobility aids) if they are needed. These include:  Canes.  Walkers.  Scooters.  Crutches.  Turn on the lights when you go into a dark area. Replace any light bulbs as soon as they burn out.  Set up your furniture so you have a clear path. Avoid moving your furniture around.  If any of your floors are uneven, fix them.  If there are any pets around you, be aware of where they are.  Review your medicines with your doctor. Some medicines can make you feel dizzy. This can increase your chance of falling. Ask your doctor what other things that you can do to help prevent falls. This information is not intended to replace advice given to you by your health care provider. Make sure you discuss any questions you have with your health care provider. Document Released: 05/24/2009 Document Revised: 01/03/2016 Document Reviewed: 09/01/2014 Elsevier Interactive Patient Education  2017 [employer]

## 2017-07-08 NOTE — Telephone Encounter (Signed)
See telephone encounter.

## 2017-07-08 NOTE — Progress Notes (Signed)
Name: Christina Ware   MRN: [id number]    DOB: 04/06/1951   Date:07/08/2017       Progress Note  Subjective  Chief Complaint  Chief Complaint  Patient presents with  . Follow-up    3 month for prediabetes     Depression         This is a recurrent problem.  The onset quality is gradual.   The problem has been gradually worsening since onset.  Associated symptoms include helplessness, hopelessness, insomnia and sad.  Associated symptoms include no fatigue, no restlessness and no suicidal ideas.     The symptoms are aggravated by social issues (lives by herself.).  Past treatments include nothing.  Risk factors: recently several of her friends and family members have passed away, husband passed away last year, parents passed away recently.    Pertinent negatives include no post-traumatic stress disorder.  Prediabetes: She has been diagnosed with prediabetes, last A1c was 6.1% 3 months ago, now 5.6%, she has made dietary and lifestyle changes in the interim, otherwise doing well. No pharmacotherapy.    Past Medical History:  Diagnosis Date  . Chronic back pain greater than 3 months duration    Degenerative Disc Disease, surgery on lower back.  . Chronic neck pain    Ruptured disc in neck  . Prediabetes     Past Surgical History:  Procedure Laterality Date  . BACK SURGERY  2005   lower back  . FOOT SURGERY Bilateral    twice  . KNEE ARTHROSCOPY Right     Family History  Problem Relation Age of Onset  . Diabetes Mother   . Cancer Mother   . Hypertension Mother   . Diabetes Father   . Cancer Father   . Hypertension Father   . Diabetes Sister   . Hypertension Sister   . Diabetes Brother   . Cancer Brother   . Hypertension Brother   . Diabetes Brother   . Birth defects Brother        born with a hole in his heart  . Lung disease Brother        patient needed a lung transplant  . Heart disease Brother        needed a heart transplant    Social History    Socioeconomic History  . Marital status: Divorced    Spouse name: Not on file  . Number of children: 0  . Years of education: Not on file  . Highest education level: Not on file  Social Needs  . Financial resource strain: Not hard at all  . Food insecurity - worry: Never true  . Food insecurity - inability: Never true  . Transportation needs - medical: No  . Transportation needs - non-medical: No  Occupational History  . Occupation: Retired  Tobacco Use  . Smoking status: Former Games developermoker  . Smokeless tobacco: Never Used  . Tobacco comment: quit when she was [age]yrs old  Substance and Sexual Activity  . Alcohol use: No    Alcohol/week: 0.0 oz  . Drug use: No  . Sexual activity: No  Other Topics Concern  . Not on file  Social History Narrative  . Not on file     Current Outpatient Medications:  .  Acetylcarnitine HCl (ACETYL L-CARNITINE PO), Take 400 mg by mouth., Disp: , Rfl:  .  Alpha-Lipoic Acid 200 MG TABS, Take by mouth., Disp: , Rfl:  .  Black Cohosh ([employer] BLACK COHOSH) 540 MG CAPS, Take  by mouth., Disp: , Rfl:  .  ciprofloxacin (CIPRO) 500 MG tablet, Take 1 tablet (500 mg total) by mouth every 12 (twelve) hours. Starting the morning before procedure and ending the days after procedure. (Patient not taking: Reported on 07/08/2017), Disp: 6 tablet, Rfl: 0 .  Desmopressin Acetate (NOCTIVA) 1.66 MCG/0.1ML EMUL, Place 1 spray into the nose at bedtime., Disp: 3.8 g, Rfl: 3 .  docusate sodium (COLACE) 100 MG capsule, Take by mouth., Disp: , Rfl:  .  fentaNYL (DURAGESIC - DOSED MCG/HR) 75 MCG/HR, Place 25 mcg onto the skin. , Disp: , Rfl:  .  gabapentin (NEURONTIN) 800 MG tablet, , Disp: , Rfl:  .  L-Arginine 500 MG CAPS, Take by mouth., Disp: , Rfl:  .  mometasone (NASONEX) 50 MCG/ACT nasal spray, Place 2 sprays into the nose daily. (Patient not taking: Reported on 07/08/2017), Disp: 17 g, Rfl: 0 .  Omega-3 Fatty Acids (FISH OIL) 1000 MG CAPS, Take by mouth., Disp: , Rfl:  .   Oxycodone HCl 10 MG TABS, , Disp: , Rfl:  .  PARoxetine (PAXIL) 20 MG tablet, Take 1 tablet (20 mg total) by mouth daily., Disp: 90 tablet, Rfl: 0 .  tiZANidine (ZANAFLEX) 4 MG tablet, Reported on 02/25/2016, Disp: , Rfl:  .  vitamin E 400 UNIT capsule, Take 400 Units by mouth daily. Reported on 02/25/2016, Disp: , Rfl:   Allergies  Allergen Reactions  . Codeine Other (See Comments)  . Hydrocodone   . Latex      Review of Systems  Constitutional: Negative for fatigue.  Psychiatric/Behavioral: Positive for depression. Negative for suicidal ideas. The patient has insomnia.     Objective  Vitals:   07/08/17 1357  BP: 124/80  Pulse: 92  Resp: 16  Temp: 98.3 F (36.8 C)  TempSrc: Oral  SpO2: 95%  Weight: 202 lb 3.2 oz (91.7 kg)    Physical Exam  Constitutional: She is oriented to person, place, and time and well-developed, well-nourished, and in no distress.  Cardiovascular: Normal rate, regular rhythm and normal heart sounds.  No murmur heard. Pulmonary/Chest: Effort normal and breath sounds normal. She has no wheezes.  Neurological: She is alert and oriented to person, place, and time.  Psychiatric: Memory, affect and judgment normal. She exhibits a depressed mood. She expresses no suicidal ideation. She expresses no suicidal plans.  Nursing note and vitals reviewed.    Assessment & Plan  1. Prediabetes A1c is 5.6%, which is considered normal, proved from prediabetes level of 6.1% from 3 months ago - POCT HgB A1C  2. Situational depression Will start on Paxil 20 mg daily for treatment of depression, educated on having her regular activity, physical exercise and other socializing to avoid symptoms of loneliness. - PARoxetine (PAXIL) 20 MG tablet; Take 1 tablet (20 mg total) by mouth daily.  Dispense: 90 tablet; Refill: 0  Kimberly Coye Asad A. Faylene KurtzShah [facility] 07/08/2017 2:38 PM

## 2017-07-08 NOTE — Progress Notes (Signed)
Subjective:   Christina Ware is a 66 y.o. female who presents for Medicare Annual (Subsequent) preventive examination.  Review of Systems:  N/A Cardiac Risk Factors include: advanced age (>51men, >50 women);sedentary lifestyle;obesity (BMI >30kg/m2)     Objective:     Vitals: BP 124/80 (BP Location: Left Arm, Patient Position: Sitting, Cuff Size: Normal)   Pulse 92   Temp 98.3 F (36.8 C) (Oral)   Resp 16   Ht 5\' 3"  (1.6 m)   Wt 202 lb 3.2 oz (91.7 kg)   BMI 35.82 kg/m   Body mass index is 35.82 kg/m.   Tobacco Social History   Tobacco Use  Smoking Status Former Smoker  Smokeless Tobacco Never Used  Tobacco Comment   quit when she was 66yrs old     Counseling given: Not Answered Comment: quit when she was 66yrs old   Past Medical History:  Diagnosis Date  . Chronic back pain greater than 3 months duration    Degenerative Disc Disease, surgery on lower back.  . Chronic neck pain    Ruptured disc in neck  . Prediabetes    Past Surgical History:  Procedure Laterality Date  . BACK SURGERY  2005   lower back  . FOOT SURGERY Bilateral    twice  . KNEE ARTHROSCOPY Right    Family History  Problem Relation Age of Onset  . Diabetes Mother   . Cancer Mother   . Hypertension Mother   . Diabetes Father   . Cancer Father   . Hypertension Father   . Diabetes Sister   . Hypertension Sister   . Diabetes Brother   . Cancer Brother   . Hypertension Brother   . Diabetes Brother   . Birth defects Brother        born with a hole in his heart  . Lung disease Brother        patient needed a lung transplant  . Heart disease Brother        needed a heart transplant   Social History   Substance and Sexual Activity  Sexual Activity No    Outpatient Encounter Medications as of 07/08/2017  Medication Sig  . Acetylcarnitine HCl (ACETYL L-CARNITINE PO) Take 400 mg by mouth.  . Alpha-Lipoic Acid 200 MG TABS Take by mouth.  . Black Cohosh ([facility] BLACK COHOSH)  540 MG CAPS Take by mouth.  . Desmopressin Acetate (NOCTIVA) 1.66 MCG/0.1ML EMUL Place 1 spray into the nose at bedtime.  . docusate sodium (COLACE) 100 MG capsule Take by mouth.  . fentaNYL (DURAGESIC - DOSED MCG/HR) 75 MCG/HR Place 25 mcg onto the skin.   Marland Kitchen gabapentin (NEURONTIN) 800 MG tablet   . L-Arginine 500 MG CAPS Take by mouth.  . Omega-3 Fatty Acids (FISH OIL) 1000 MG CAPS Take by mouth.  . Oxycodone HCl 10 MG TABS   . vitamin E 400 UNIT capsule Take 400 Units by mouth daily. Reported on 02/25/2016  . ciprofloxacin (CIPRO) 500 MG tablet Take 1 tablet (500 mg total) by mouth every 12 (twelve) hours. Starting the morning before procedure and ending the days after procedure. (Patient not taking: Reported on 07/08/2017)  . mometasone (NASONEX) 50 MCG/ACT nasal spray Place 2 sprays into the nose daily. (Patient not taking: Reported on 07/08/2017)  . tiZANidine (ZANAFLEX) 4 MG tablet Reported on 02/25/2016   No facility-administered encounter medications on file as of 07/08/2017.     Activities of Daily Living In your present state of  health, do you have any difficulty performing the following activities: 07/08/2017 04/07/2017  Hearing? N N  Vision? N Y  Comment - glasses  Difficulty concentrating or making decisions? N N  Walking or climbing stairs? Y N  Comment knee pain -  Dressing or bathing? N N  Doing errands, shopping? N N  Preparing Food and eating ? N -  Using the Toilet? N -  In the past six months, have you accidently leaked urine? N -  Do you have problems with loss of bowel control? N -  Managing your Medications? N -  Managing your Finances? N -  Housekeeping or managing your Housekeeping? N -  Some recent data might be hidden    Patient Care Team: Ellyn HackShah, Syed Asad A, MD as PCP - General (Family Medicine) Sheran Luzamos, Richard, MD as Consulting Physician (Physical Medicine and Rehabilitation) Alfredo MartinezMacDiarmid, Scott, MD as Consulting Physician (Urology) Bjorn PippinShah, Aashit K, MD as  Consulting Physician (Neurology)    Assessment:     Exercise Activities and Dietary recommendations Current Exercise Habits: The patient does not participate in regular exercise at present, Exercise limited by: None identified;Other - see comments(states she feels depressed)  Goals    None     Fall Risk: TUG test 9sec. No intervention required Fall Risk  07/08/2017 04/07/2017 07/08/2016 06/02/2016 12/14/2015  Falls in the past year? No No No No No   Depression Screen PHQ 2/9 Scores 07/08/2017 04/07/2017 07/08/2016 06/02/2016  PHQ - 2 Score 6 0 3 1  PHQ- 9 Score 8 - 10 -     Cognitive Function     6CIT Screen 07/08/2017 06/02/2016  What Year? 0 points 0 points  What month? 0 points 0 points  What time? 0 points 0 points  Count back from 20 0 points 0 points  Months in reverse 0 points 0 points  Repeat phrase 2 points 2 points  Total Score 2 2    Immunization History  Administered Date(s) Administered  . Influenza, High Dose Seasonal PF 04/07/2017  . Influenza-Unspecified 05/15/2015, 05/13/2016  . Pneumococcal Conjugate-13 04/07/2017  . Zoster Recombinat (Shingrix) 05/13/2017   Screening Tests Health Maintenance  Topic Date Due  . Hepatitis C Screening  March 21, 1951  . HIV Screening  02/16/1967  . TETANUS/TDAP  06/02/2026 (Originally 02/16/1971)  . MAMMOGRAM  10/01/2017  . PNA vac Low Risk Adult (2 of 2 - PPSV23) 04/07/2018  . PAP SMEAR  06/13/2019  . Fecal DNA (Cologuard)  07/01/2019  . INFLUENZA VACCINE  Completed  . DEXA SCAN  Completed      Plan:    I have personally reviewed and addressed the Medicare Annual Wellness questionnaire and have noted the following in the patient's chart:  A. Medical and social history B. Use of alcohol, tobacco or illicit drugs  C. Current medications and supplements D. Functional ability and status E.  Nutritional status F.  Physical activity G. Advance directives and Code Status: Pt provided with MOST and DNR documents to  review and to complete. Advised to return completed documents to our office to ensure proper communication of his/her needs to his/her healthcare team. H. List of other physicians I.  Hospitalizations, surgeries, and ER visits in previous 12 months J.  Vitals K. Screenings such as hearing and vision if needed, cognitive and depression L. Referrals and appointments - none  In addition, I have reviewed and discussed with patient certain preventive protocols, quality metrics, and best practice recommendations. A written personalized care plan for preventive  services as well as general preventive health recommendations were provided to patient.  See attached scanned questionnaire for additional information.   Signed,  Deon PillingAmmie Ollie Esty, LPN Nurse Health Advisor  Nurses Notes: Pt expressed that she is becoming increasingly depressed since family and friends have passed away. PHQ9 score = 8. Dr. Sherryll BurgerShah made aware.   Recommendations for Immunizations per CDC guidelines:  Vaccinations: Influenza vaccine: Completed 04/07/17 Pneumococcal vaccine: Completed PCV13 04/07/17. PPSV23 required after 04/07/18 Tdap vaccine: Declined. Pt advised to call her insurance company to determine her out of pocket expense. Advised she may also receive this vaccine at her local pharmacy or Health Dept. Shingles vaccine: Declined. Pt advised to call her insurance company to determine her out of pocket expense. Advised she may also receive this vaccine at her local pharmacy or Health Dept.  Recommendations for Health Maintenance Screenings:  Screening recommendations/referrals: Colonoscopy: Cologuard completed 06/30/16. Repeat colon cancer screening every 3 years Mammogram: Completed 10/02/16. Repeat mammogram in one year Bone Density: Completed 10/01/16. Repeat osteoporotic screenings no longer required HIV Screening: Ordered today Hep C Screening: Ordered today Recommended yearly ophthalmology/optometry visit for  glaucoma screening and checkup Recommended yearly dental visit for hygiene and checkup

## 2017-07-09 NOTE — Telephone Encounter (Signed)
Pt has concerns about taking the Paxil and using the Fentanyl. She wanted clarification on how safe it was to use the 2 together. Please give her a call.

## 2017-07-09 NOTE — Telephone Encounter (Signed)
Called into a pocket T's, taking Paxil together affect and creates the possibility of serotonin syndrome, educated patient in detail about possible symptoms of serotonin syndrome, patient has taken Paxil in the past with an an opioid and did not have any problems. Recommend to call us if she experiences any side effects and she verbalized agreement.

## 2017-08-07 ENCOUNTER — Other Ambulatory Visit: Payer: Medicare Other

## 2017-08-07 DIAGNOSIS — R351 Nocturia: Secondary | ICD-10-CM

## 2017-08-07 DIAGNOSIS — R3915 Urgency of urination: Secondary | ICD-10-CM | POA: Diagnosis not present

## 2017-08-07 LAB — URINALYSIS, COMPLETE
BILIRUBIN UA: NEGATIVE
GLUCOSE, UA: NEGATIVE
KETONES UA: NEGATIVE
Leukocytes, UA: NEGATIVE
NITRITE UA: NEGATIVE
Protein, UA: NEGATIVE
UUROB: 0.2 mg/dL (ref 0.2–1.0)
pH, UA: 6.5 (ref 5.0–7.5)

## 2017-08-10 LAB — CULTURE, URINE COMPREHENSIVE

## 2017-08-13 ENCOUNTER — Other Ambulatory Visit: Payer: Self-pay | Admitting: Family Medicine

## 2017-08-13 MED ORDER — CIPROFLOXACIN HCL 250 MG PO TABS: 250.0000 mg | ORAL_TABLET | Freq: Two times a day (BID) | ORAL | 0 refills | Status: DC

## 2017-08-17 ENCOUNTER — Encounter: Payer: Self-pay | Admitting: Urology

## 2017-08-17 ENCOUNTER — Ambulatory Visit: Payer: Medicare Other | Admitting: Urology

## 2017-08-17 VITALS — BP 97/76 | HR 64 | Ht 63.0 in | Wt 200.0 lb

## 2017-08-17 DIAGNOSIS — R350 Frequency of micturition: Secondary | ICD-10-CM

## 2017-08-17 DIAGNOSIS — R351 Nocturia: Secondary | ICD-10-CM

## 2017-08-17 LAB — MICROSCOPIC EXAMINATION

## 2017-08-17 LAB — URINALYSIS, COMPLETE
Bilirubin, UA: NEGATIVE
Glucose, UA: NEGATIVE
Leukocytes, UA: NEGATIVE
NITRITE UA: NEGATIVE
PH UA: 5 (ref 5.0–7.5)
RBC, UA: NEGATIVE
Specific Gravity, UA: >1.03 — ABNORMAL HIGH (ref 1.005–1.030)
UUROB: 0.2 mg/dL (ref 0.2–1.0)

## 2017-08-17 MED ORDER — SODIUM CHLORIDE 0.9 % IJ SOLN
10.0000 mL | Freq: Once | INTRAMUSCULAR | Status: AC
  Administered 2017-08-17: 10 mL

## 2017-08-17 MED ORDER — ONABOTULINUMTOXINA 100 UNITS IJ SOLR
100.0000 [IU] | Freq: Once | INTRAMUSCULAR | Status: AC
  Administered 2017-08-17: 100 [IU] via INTRAMUSCULAR

## 2017-08-17 MED ORDER — LIDOCAINE HCL 2 % EX GEL
1.0000 "application " | Freq: Once | CUTANEOUS | Status: AC
  Administered 2017-08-17: 1 via URETHRAL

## 2017-08-17 MED ORDER — LIDOCAINE HCL 2 % IJ SOLN
50.0000 mL | Freq: Once | INTRAMUSCULAR | Status: AC
  Administered 2017-08-17: 1000 mg

## 2017-08-17 NOTE — Progress Notes (Signed)
08/17/2017 10:24 AM   Christina Ware Feb 12, 1951 [id number]  Referring provider: Roselee Nova, MD [address] [place], [place] [address]  Chief Complaint  Patient presents with  . Cysto    HPI: Read lasts long note:  The nurses looked into her notes and appears at the residuals are always less than 100 mL and she did void up to 40 times a day. This supports an overactive bladder diagnosis and possibly now a malfunctioning InterStim  The patient's impedance was abnormal in the least 2 leads. The [employer] representative reprogram the device around these positions  In the last 3 months the patient is voiding more frequently at night. She can void every 20 minutes. She denies ankle edema. Her daytime frequency is still every 4 or 5 hours. Clinically she is not infected. She has tried turning up the device and she feels it more rectal. She has tried different settings. When it was working differently she will get up twice a night  She does feel the device protruding a little bit in the right mid or upper buttock and and canbrush again things in a tight situation  On physical examination there was no tenderness. The device was protruding a little bit medially. The device is located in the right mid or upper buttock.  Patient on DDAVP The patient thinks the desmopressin may have helped minimally. Sodium is normal. She is on low dose. She kept a diary and she can void 8-17 times a day and gets up 6 or less times at night.  Overall she has done very well with her frequency and nocturia but I find that her treatment goals are very high and may not be able to be met.  I went over Botox in detail with my usual template.  The patient will think about Botox but would like to try the higher dose desmopressin prescribed. Reevaluate in about a month. I do not believe that any adjustments or changing of InterStim would make a difference  The patient once again  kept a diary and she voids 8-14 times a day and many times it is 14 and she tends to get up for 5 times a night.  The patient is still hoping to improve her outcome and she finds the urgency while shopping very bothersome.  She would really like to proceed with Botox.  Again she is very high treatment goals  Today The patient did have a positive urine culture and was here on antibiotics for Botox treatment Clinically the patient did not have a bladder infection we called in the prescription.  Having said that on the same day she noted minimal burning.  Flexible cystoscopy: The patient underwent flexible cystoscopy utilizing sterile technique.  Bladder mucosa and trigone were normal.  There is no foreign body.  There is no stitch.  There is no cystitis.  She was filled to approximately 250 mL comfortably  Utilizing flexible cystoscopy in the injection needle I injected 100 units of Botox with 10 cc of normal saline at 5 and 7:00 and cephalad to the interureteric ridge.  Approximately one third of the injections were a bit uncomfortable but overall she did well.  There was minimal bleeding.  PMH: Past Medical History:  Diagnosis Date  . Chronic back pain greater than 3 months duration    Degenerative Disc Disease, surgery on lower back.  . Chronic neck pain    Ruptured disc in neck  . Prediabetes  Surgical History: Past Surgical History:  Procedure Laterality Date  . BACK SURGERY  2005   lower back  . FOOT SURGERY Bilateral    twice  . KNEE ARTHROSCOPY Right     Home Medications:  Allergies as of 08/17/2017      Reactions   Codeine Other (See Comments)   Hydrocodone    Latex       Medication List        Accurate as of 08/17/17 10:24 AM. Always use your most recent med list.          ACETYL L-CARNITINE PO Take 400 mg by mouth.   Alpha-Lipoic Acid 200 MG Tabs Take by mouth.   ciprofloxacin 500 MG tablet Commonly known as:  CIPRO Take 1 tablet (500 mg total) by mouth  every 12 (twelve) hours. Starting the morning before procedure and ending the days after procedure.   ciprofloxacin 250 MG tablet Commonly known as:  CIPRO Take 1 tablet (250 mg total) by mouth 2 (two) times daily.   [facility] BLACK COHOSH 540 MG Caps Generic drug:  Black Cohosh Take by mouth.   Desmopressin Acetate 1.66 MCG/0.1ML Emul Commonly known as:  NOCTIVA Place 1 spray into the nose at bedtime.   docusate sodium 100 MG capsule Commonly known as:  COLACE Take by mouth.   fentaNYL 75 MCG/HR Commonly known as:  DURAGESIC - dosed mcg/hr Place 25 mcg onto the skin.   Fish Oil 1000 MG Caps Take by mouth.   gabapentin 800 MG tablet Commonly known as:  NEURONTIN   L-Arginine 500 MG Caps Take by mouth.   mometasone 50 MCG/ACT nasal spray Commonly known as:  NASONEX Place 2 sprays into the nose daily.   Oxycodone HCl 10 MG Tabs   PARoxetine 20 MG tablet Commonly known as:  PAXIL Take 1 tablet (20 mg total) by mouth daily.   tiZANidine 4 MG tablet Commonly known as:  ZANAFLEX Reported on 02/25/2016   vitamin E 400 UNIT capsule Take 400 Units by mouth daily. Reported on 02/25/2016       Allergies:  Allergies  Allergen Reactions  . Codeine Other (See Comments)  . Hydrocodone   . Latex     Family History: Family History  Problem Relation Age of Onset  . Diabetes Mother   . Cancer Mother   . Hypertension Mother   . Diabetes Father   . Cancer Father   . Hypertension Father   . Diabetes Sister   . Hypertension Sister   . Diabetes Brother   . Cancer Brother   . Hypertension Brother   . Diabetes Brother   . Birth defects Brother        born with a hole in his heart  . Lung disease Brother        patient needed a lung transplant  . Heart disease Brother        needed a heart transplant    Social History:  reports that she has quit smoking. she has never used smokeless tobacco. She reports that she does not drink alcohol or use drugs.  ROS:                                          Physical Exam: There were no vitals taken for this visit.  Constitutional:  Alert and oriented, No acute distress.  Laboratory Data: Lab Results  Component Value Date   WBC 11.1 (H) 10/17/2016   HGB 14.1 10/17/2016   HCT 41.9 10/17/2016   MCV 92.5 10/17/2016   PLT 274 10/17/2016    Lab Results  Component Value Date   CREATININE 0.77 04/28/2017    No results found for: PSA  No results found for: TESTOSTERONE  Lab Results  Component Value Date   HGBA1C 5.6 07/08/2017    Urinalysis    Component Value Date/Time   COLORURINE YELLOW (A) 10/17/2016 1540   APPEARANCEUR Clear 08/07/2017 1420   LABSPEC 1.020 10/17/2016 1540   LABSPEC 1.020 05/14/2012 1702   PHURINE 6.0 10/17/2016 1540   GLUCOSEU Negative 08/07/2017 1420   GLUCOSEU Negative 05/14/2012 1702   HGBUR SMALL (A) 10/17/2016 1540   BILIRUBINUR Negative 08/07/2017 1420   BILIRUBINUR Negative 05/14/2012 1702   KETONESUR 5 (A) 10/17/2016 1540   PROTEINUR Negative 08/07/2017 1420   PROTEINUR NEGATIVE 10/17/2016 1540   UROBILINOGEN 0.2 09/04/2015 1338   UROBILINOGEN 0.2 05/17/2009 1634   NITRITE Negative 08/07/2017 1420   NITRITE NEGATIVE 10/17/2016 1540   LEUKOCYTESUR Negative 08/07/2017 1420   LEUKOCYTESUR Negative 05/14/2012 1702    Pertinent Imaging:   Assessment & Plan: The patient will follow up as per protocol.  Overall I was pleased with the treatment  1. Nocturia 2.  Increased frequency - Urinalysis, Complete   No Follow-up on file.  Reece Packer, MD  [facility] [address], [place], [place] [address] [phone]

## 2017-08-17 NOTE — Progress Notes (Signed)
In and Out Catheterization  Patient is present today for a I & O catheterization prior to Botox procedure. Patient was cleaned and prepped in a sterile fashion with betadine and Lidocaine 2% jelly was instilled into the urethra.  A 14FR cath was inserted no complications were noted , 20ml of urine return was noted, urine was yellow in color. . Bladder was drained 60ml of 2% Lidocaine was instilled into the bladder and catheter was removed with out difficulty.  Patient was left for 30min with lidocaine in bladder prior to procedure  Preformed by: Eligha BridegroomSarah Amanda Steuart, CMA

## 2017-08-28 ENCOUNTER — Other Ambulatory Visit: Payer: Self-pay | Admitting: Family Medicine

## 2017-08-28 ENCOUNTER — Telehealth: Payer: Self-pay | Admitting: Urology

## 2017-08-28 MED ORDER — CIPROFLOXACIN HCL 250 MG PO TABS: 250.0000 mg | ORAL_TABLET | Freq: Two times a day (BID) | ORAL | 0 refills | Status: DC

## 2017-08-28 NOTE — Telephone Encounter (Signed)
Patient called the office today with recurrent UTI symptoms after completing abx (Cipro).  She is already scheduled to come in on Monday, 1/21 for nurse visit.  I advised her that we would do a UA and culture to make sure that we are dealing with the same infection.   She was concerned about having to go through the weekend with symptoms worsening and without some relief.

## 2017-08-28 NOTE — Telephone Encounter (Signed)
Per Dr. Sherron MondayMacDiarmid, based on previous culture, repeat the ciprofloxacin prescription.    [facility] and patient notified.

## 2017-08-31 ENCOUNTER — Ambulatory Visit: Payer: Medicare Other | Admitting: Urology

## 2017-08-31 ENCOUNTER — Encounter: Payer: Self-pay | Admitting: Urology

## 2017-08-31 VITALS — BP 134/74 | HR 86 | Ht 63.0 in | Wt 205.0 lb

## 2017-08-31 DIAGNOSIS — R351 Nocturia: Secondary | ICD-10-CM

## 2017-08-31 LAB — BLADDER SCAN AMB NON-IMAGING: Scan Result: 46

## 2017-08-31 NOTE — Progress Notes (Unsigned)
   08/31/2017 2:13 PM   Christina Ware 03/16/1951 [id number]  Referring provider: Ellyn HackShah, Syed Asad A, MD [address] [place], [place] [id number]  Chief Complaint  Patient presents with  . Nocturia   The patient had a nurse visit today and was not seen by myself

## 2017-09-01 DIAGNOSIS — Z79891 Long term (current) use of opiate analgesic: Secondary | ICD-10-CM | POA: Diagnosis not present

## 2017-09-01 DIAGNOSIS — M961 Postlaminectomy syndrome, not elsewhere classified: Secondary | ICD-10-CM | POA: Diagnosis not present

## 2017-09-01 DIAGNOSIS — G894 Chronic pain syndrome: Secondary | ICD-10-CM | POA: Diagnosis not present

## 2017-09-01 DIAGNOSIS — M545 Low back pain, unspecified: Secondary | ICD-10-CM | POA: Insufficient documentation

## 2017-09-01 DIAGNOSIS — G8929 Other chronic pain: Secondary | ICD-10-CM | POA: Insufficient documentation

## 2017-09-10 ENCOUNTER — Telehealth: Payer: Self-pay

## 2017-09-10 NOTE — Telephone Encounter (Signed)
        Copied from CRM [id number]. Topic: Referral - Request >> Sep 10, 2017  1:57 PM Oneal GroutSebastian, Jennifer S wrote: Reason for CRM: Requesting [facility] for [facility]

## 2017-09-10 NOTE — Telephone Encounter (Signed)
Spoke to the patient and she states they mention to her that her PCP would have to put in an order in for this years mammogram. Next Mammogram is due 10/01/2017 and Pt states she would like her New PCP to be able to look over it since Dr. Sherryll BurgerShah is leaving.I was talking to the patient and tried to suggest couple of options but Pt states she has to leave for an apt and she will call back.

## 2017-09-14 ENCOUNTER — Encounter: Payer: Self-pay | Admitting: Urology

## 2017-09-14 ENCOUNTER — Ambulatory Visit (INDEPENDENT_AMBULATORY_CARE_PROVIDER_SITE_OTHER): Payer: Medicare Other | Admitting: Urology

## 2017-09-14 VITALS — BP 116/81 | HR 93 | Ht 63.0 in | Wt 205.0 lb

## 2017-09-14 DIAGNOSIS — R351 Nocturia: Secondary | ICD-10-CM | POA: Diagnosis not present

## 2017-09-14 LAB — URINALYSIS, COMPLETE
Bilirubin, UA: NEGATIVE
GLUCOSE, UA: NEGATIVE
KETONES UA: NEGATIVE
LEUKOCYTES UA: NEGATIVE
Nitrite, UA: NEGATIVE
Protein, UA: NEGATIVE
SPEC GRAV UA: 1.02 (ref 1.005–1.030)
Urobilinogen, Ur: 0.2 mg/dL (ref 0.2–1.0)
pH, UA: 6 (ref 5.0–7.5)

## 2017-09-14 LAB — BLADDER SCAN AMB NON-IMAGING

## 2017-09-14 LAB — MICROSCOPIC EXAMINATION
Bacteria, UA: NONE SEEN
Epithelial Cells (non renal): NONE SEEN /hpf (ref 0–10)
WBC, UA: NONE SEEN /hpf (ref 0–?)

## 2017-09-14 MED ORDER — SULFAMETHOXAZOLE-TRIMETHOPRIM 800-160 MG PO TABS: 1.0000 | ORAL_TABLET | Freq: Two times a day (BID) | ORAL | 0 refills | Status: DC

## 2017-09-14 NOTE — Progress Notes (Signed)
09/14/2017 3:35 PM   Christina Ware 11/29/1950 [id number]  Referring provider: Ellyn HackShah, Syed Asad A, MD [address] [place], [place] [id number]  Chief Complaint  Patient presents with  . Follow-up    2wk    HPI: I dictated a lengthy note last time.  On January 8 the patient had Botox.  She has an InterStim noted.  She voids 8-14 times a day and 5 times at night.  She has had a recent positive culture.   She actually finished a second course of an antibiotic empirically called in.  The ciprofloxacin had been repeated.  The culture was pansensitive  In the last 8 or 9 days she has had more flow symptoms.  She will hesitate.  She will then strain.  She will stop and start.  She does not feel that she can start her empty well.  She was scanned today for 111 mL.  She has had a catheterized but was not sure if she was getting into the bladder well enough and sometimes would only get a few milliliters  We walked her through catheterization today to make certain that she could and this is how we get the urine sent for culture.  It is difficult if she truly is in retention or is having trouble voiding possibly from a bladder infection with small volume frequency.  She got up more than 10 times last night  Modifying factors: There are no other modifying factors  Associated signs and symptoms: There are no other associated signs and symptoms Aggravating and relieving factors: There are no other aggravating or relieving factors Severity: Moderate Duration: Persistent   PMH: Past Medical History:  Diagnosis Date  . Chronic back pain greater than 3 months duration    Degenerative Disc Disease, surgery on lower back.  . Chronic neck pain    Ruptured disc in neck  . Prediabetes     Surgical History: Past Surgical History:  Procedure Laterality Date  . BACK SURGERY  2005   lower back  . FOOT SURGERY Bilateral    twice  . KNEE ARTHROSCOPY Right     Home Medications:    Allergies as of 09/14/2017      Reactions   Codeine Other (See Comments)   Hydrocodone    Latex       Medication List        Accurate as of 09/14/17  3:35 PM. Always use your most recent med list.          ACETYL L-CARNITINE PO Take 400 mg by mouth.   Alpha-Lipoic Acid 200 MG Tabs Take by mouth.   [facility] BLACK COHOSH 540 MG Caps Generic drug:  Black Cohosh Take by mouth.   Desmopressin Acetate 1.66 MCG/0.1ML Emul Commonly known as:  NOCTIVA Place 1 spray into the nose at bedtime.   docusate sodium 100 MG capsule Commonly known as:  COLACE Take by mouth.   fentaNYL 50 MCG/HR Commonly known as:  DURAGESIC - dosed mcg/hr   fentaNYL 25 MCG/HR patch Commonly known as:  DURAGESIC - dosed mcg/hr APPLY 1 PATCH ONTO SKIN EVERY 72 HOURS   fentaNYL 75 MCG/HR Commonly known as:  DURAGESIC - dosed mcg/hr Place 25 mcg onto the skin.   Fish Oil 1000 MG Caps Take by mouth.   gabapentin 800 MG tablet Commonly known as:  NEURONTIN   L-Arginine 500 MG Caps Take by mouth.   mometasone 50 MCG/ACT nasal spray Commonly known as:  NASONEX Place  2 sprays into the nose daily.   Oxycodone HCl 10 MG Tabs   PARoxetine 20 MG tablet Commonly known as:  PAXIL Take 1 tablet (20 mg total) by mouth daily.   tiZANidine 4 MG tablet Commonly known as:  ZANAFLEX Reported on 02/25/2016   vitamin E 400 UNIT capsule Take 400 Units by mouth daily. Reported on 02/25/2016       Allergies:  Allergies  Allergen Reactions  . Codeine Other (See Comments)  . Hydrocodone   . Latex     Family History: Family History  Problem Relation Age of Onset  . Diabetes Mother   . Cancer Mother   . Hypertension Mother   . Diabetes Father   . Cancer Father   . Hypertension Father   . Diabetes Sister   . Hypertension Sister   . Diabetes Brother   . Cancer Brother   . Hypertension Brother   . Diabetes Brother   . Birth defects Brother        born with a hole in his heart  . Lung disease  Brother        patient needed a lung transplant  . Heart disease Brother        needed a heart transplant    Social History:  reports that she has quit smoking. she has never used smokeless tobacco. She reports that she does not drink alcohol or use drugs.  ROS: UROLOGY Frequent Urination?: Yes Hard to postpone urination?: Yes Burning/pain with urination?: Yes Get up at night to urinate?: Yes Leakage of urine?: No Urine stream starts and stops?: Yes Trouble starting stream?: Yes Do you have to strain to urinate?: Yes Blood in urine?: No Urinary tract infection?: No Sexually transmitted disease?: No Injury to kidneys or bladder?: No Painful intercourse?: No Weak stream?: Yes Currently pregnant?: No Vaginal bleeding?: No Last menstrual period?: n  Gastrointestinal Nausea?: No Vomiting?: No Indigestion/heartburn?: No Diarrhea?: No Constipation?: No  Constitutional Fever: No Night sweats?: No Weight loss?: No Fatigue?: No  Skin Skin rash/lesions?: Yes Itching?: Yes  Eyes Blurred vision?: No Double vision?: No  Ears/Nose/Throat Sore throat?: No Sinus problems?: No  Hematologic/Lymphatic Swollen glands?: No Easy bruising?: No  Cardiovascular Leg swelling?: No Chest pain?: No  Respiratory Cough?: No Shortness of breath?: No  Endocrine Excessive thirst?: Yes  Musculoskeletal Back pain?: Yes Joint pain?: No  Neurological Headaches?: No Dizziness?: No  Psychologic Depression?: No Anxiety?: No  Physical Exam: BP 116/81   Pulse 93   Ht 5\' 3"  (1.6 m)   Wt 205 lb (93 kg)   BMI 36.31 kg/m   Constitutional:  Alert and oriented, No acute distress.   Laboratory Data: Lab Results  Component Value Date   WBC 11.1 (H) 10/17/2016   HGB 14.1 10/17/2016   HCT 41.9 10/17/2016   MCV 92.5 10/17/2016   PLT 274 10/17/2016    Lab Results  Component Value Date   CREATININE 0.77 04/28/2017    No results found for: PSA  No results found for:  TESTOSTERONE  Lab Results  Component Value Date   HGBA1C 5.6 07/08/2017    Urinalysis    Component Value Date/Time   COLORURINE YELLOW (A) 10/17/2016 1540   APPEARANCEUR Clear 08/17/2017 1021   LABSPEC 1.020 10/17/2016 1540   LABSPEC 1.020 05/14/2012 1702   PHURINE 6.0 10/17/2016 1540   GLUCOSEU Negative 08/17/2017 1021   GLUCOSEU Negative 05/14/2012 1702   HGBUR SMALL (A) 10/17/2016 1540   BILIRUBINUR Negative 08/17/2017 1021  BILIRUBINUR Negative 05/14/2012 1702   KETONESUR 5 (A) 10/17/2016 1540   PROTEINUR 1+ (A) 08/17/2017 1021   PROTEINUR NEGATIVE 10/17/2016 1540   UROBILINOGEN 0.2 09/04/2015 1338   UROBILINOGEN 0.2 05/17/2009 1634   NITRITE Negative 08/17/2017 1021   NITRITE NEGATIVE 10/17/2016 1540   LEUKOCYTESUR Negative 08/17/2017 1021   LEUKOCYTESUR Negative 05/14/2012 1702    Pertinent Imaging: none  Assessment & Plan:   Based on working with the nurse today she knows how to catheterize.  Small volume frequency from a bladder infection may explain this.  I sent the urine for culture and called in Septra DS 1 tablet twice a day for 1 week.  We will call her daily for a few days to check on her.  She will follow-up as scheduled  There are no diagnoses linked to this encounter.  No Follow-up on file.  Martina Sinner, MD  [facility] [address] [place], [place] [address] [phone]

## 2017-09-14 NOTE — Addendum Note (Signed)
Addended by: Skeet LatchWATKINS, CHELSEA C on: 09/14/2017 04:11 PM   Modules accepted: Orders

## 2017-09-16 LAB — CULTURE, URINE COMPREHENSIVE

## 2017-09-25 ENCOUNTER — Telehealth: Payer: Self-pay

## 2017-09-25 NOTE — Telephone Encounter (Signed)
Pt called stating she was given sulfa at her last OV with Dr. Sherron MondayMacDiarmid for a UTI and wanted a refill. Made pt aware her ucx from 10/04/17 was negative. Also made pt aware would need a urine specimen to send for cx prior to given a refill. Pt became upset stating "So I am just going to have to wait until Monday and just get worse?" Reinforced with pt that last ucx performed was negative with no growth after 48hrs therefore a urine specimen would be needed to ensure she truly has an infection. Pt stated that shes having the same s/s that she had on 09/14/17. Once again reinforced with pt that the ucx from 2/4 was no growth. Pt stated "well crap ill just wait".

## 2017-09-28 ENCOUNTER — Encounter: Payer: Self-pay | Admitting: Urology

## 2017-09-28 ENCOUNTER — Ambulatory Visit (INDEPENDENT_AMBULATORY_CARE_PROVIDER_SITE_OTHER): Payer: Medicare Other | Admitting: Urology

## 2017-09-28 VITALS — BP 119/75 | HR 78 | Ht 63.0 in | Wt 208.0 lb

## 2017-09-28 DIAGNOSIS — R3915 Urgency of urination: Secondary | ICD-10-CM

## 2017-09-28 LAB — URINALYSIS, COMPLETE
Bilirubin, UA: NEGATIVE
Glucose, UA: NEGATIVE
Ketones, UA: NEGATIVE
LEUKOCYTES UA: NEGATIVE
Nitrite, UA: NEGATIVE
PH UA: 5.5 (ref 5.0–7.5)
Protein, UA: NEGATIVE
RBC, UA: NEGATIVE
Specific Gravity, UA: 1.02 (ref 1.005–1.030)
Urobilinogen, Ur: 0.2 mg/dL (ref 0.2–1.0)

## 2017-09-28 LAB — MICROSCOPIC EXAMINATION

## 2017-09-28 NOTE — Progress Notes (Signed)
09/28/2017 3:07 PM   Uvaldo Risingarolyn S Cardona 11/24/1950 [id number]  Referring provider: Ellyn HackShah, Syed Asad A, MD [address] [place], [place] [address]  Chief Complaint  Patient presents with  . Nocturia    HPI: I dictated a lengthy note last time.  On January 8 the patient had Botox.  She has an InterStim noted.  She voids 8-14 times a day and 5 times at night.  She has had a recent positive culture.   She actually finished a second course of an antibiotic empirically called in.  The ciprofloxacin had been repeated.  The culture was pansensitive  In the last 8 or 9 days she has had more flow symptoms.  She will hesitate.  She will then strain.  She will stop and start.  She does not feel that she can start her empty well.  She was scanned today for 111 mL.  She has had a catheterized but was not sure if she was getting into the bladder well enough and sometimes would only get a few milliliters  We walked her through catheterization today to make certain that she could and this is how we get the urine sent for culture.  It is difficult if she truly is in retention or is having trouble voiding possibly from a bladder infection with small volume frequency.  She got up more than 10 times last night  Based on working with the nurse today she knows how to catheterize.  Small volume frequency from a bladder infection may explain this.  I sent the urine for culture and called in Septra DS 1 tablet twice a day for 1 week.  We will call her daily for a few days to check on her.   Today The patient is now catheterizing multiple times a day and at night.  She has trouble to initiate urination.  Her flow was poor.  She can strain.  She can catheterized for a cup Foley urine.  Clinically she is not infected but I will send a urine for culture   PMH: Past Medical History:  Diagnosis Date  . Chronic back pain greater than 3 months duration    Degenerative Disc Disease, surgery on lower  back.  . Chronic neck pain    Ruptured disc in neck  . Prediabetes     Surgical History: Past Surgical History:  Procedure Laterality Date  . BACK SURGERY  2005   lower back  . FOOT SURGERY Bilateral    twice  . KNEE ARTHROSCOPY Right     Home Medications:  Allergies as of 09/28/2017      Reactions   Codeine Other (See Comments)   Hydrocodone    Latex       Medication List        Accurate as of 09/28/17  3:07 PM. Always use your most recent med list.          ACETYL L-CARNITINE PO Take 400 mg by mouth.   Alpha-Lipoic Acid 200 MG Tabs Take by mouth.   [facility] BLACK COHOSH 540 MG Caps Generic drug:  Black Cohosh Take by mouth.   Desmopressin Acetate 1.66 MCG/0.1ML Emul Commonly known as:  NOCTIVA Place 1 spray into the nose at bedtime.   docusate sodium 100 MG capsule Commonly known as:  COLACE Take by mouth.   fentaNYL 50 MCG/HR Commonly known as:  DURAGESIC - dosed mcg/hr   fentaNYL 25 MCG/HR patch Commonly known as:  DURAGESIC - dosed mcg/hr APPLY  1 PATCH ONTO SKIN EVERY 72 HOURS   fentaNYL 75 MCG/HR Commonly known as:  DURAGESIC - dosed mcg/hr Place 25 mcg onto the skin.   Fish Oil 1000 MG Caps Take by mouth.   gabapentin 800 MG tablet Commonly known as:  NEURONTIN   L-Arginine 500 MG Caps Take by mouth.   mometasone 50 MCG/ACT nasal spray Commonly known as:  NASONEX Place 2 sprays into the nose daily.   Oxycodone HCl 10 MG Tabs oxycodone 10 mg tablet  Take 1 tablet 4 times a day by oral route as needed.   Oxycodone HCl 10 MG Tabs   PARoxetine 20 MG tablet Commonly known as:  PAXIL Take 1 tablet (20 mg total) by mouth daily.   sulfamethoxazole-trimethoprim 800-160 MG tablet Commonly known as:  BACTRIM DS,SEPTRA DS Take 1 tablet by mouth every 12 (twelve) hours.   tiZANidine 4 MG tablet Commonly known as:  ZANAFLEX Reported on 02/25/2016   vitamin E 400 UNIT capsule Take 400 Units by mouth daily. Reported on 02/25/2016        Allergies:  Allergies  Allergen Reactions  . Codeine Other (See Comments)  . Hydrocodone   . Latex     Family History: Family History  Problem Relation Age of Onset  . Diabetes Mother   . Cancer Mother   . Hypertension Mother   . Diabetes Father   . Cancer Father   . Hypertension Father   . Diabetes Sister   . Hypertension Sister   . Diabetes Brother   . Cancer Brother   . Hypertension Brother   . Diabetes Brother   . Birth defects Brother        born with a hole in his heart  . Lung disease Brother        patient needed a lung transplant  . Heart disease Brother        needed a heart transplant    Social History:  reports that she has quit smoking. she has never used smokeless tobacco. She reports that she does not drink alcohol or use drugs.  ROS: UROLOGY Frequent Urination?: Yes Hard to postpone urination?: Yes Burning/pain with urination?: Yes Get up at night to urinate?: Yes Leakage of urine?: No Urine stream starts and stops?: Yes Trouble starting stream?: Yes Do you have to strain to urinate?: Yes Blood in urine?: No Urinary tract infection?: No Sexually transmitted disease?: No Injury to kidneys or bladder?: No Painful intercourse?: No Weak stream?: No Currently pregnant?: No Vaginal bleeding?: No Last menstrual period?: n  Gastrointestinal Nausea?: No Vomiting?: No Indigestion/heartburn?: No Diarrhea?: No Constipation?: No  Constitutional Fever: No Night sweats?: No Weight loss?: No Fatigue?: No  Skin Skin rash/lesions?: No  Eyes Blurred vision?: No Double vision?: No  Ears/Nose/Throat Sore throat?: No Sinus problems?: No  Hematologic/Lymphatic Swollen glands?: No Easy bruising?: No  Cardiovascular Leg swelling?: No Chest pain?: No  Respiratory Cough?: No Shortness of breath?: No  Endocrine Excessive thirst?: Yes  Musculoskeletal Back pain?: Yes Joint pain?: No  Neurological Headaches?: No Dizziness?:  No  Psychologic Depression?: No Anxiety?: No  Physical Exam: BP 119/75 (BP Location: Right Arm, Patient Position: Sitting, Cuff Size: Large)   Pulse 78   Ht 5\' 3"  (1.6 m)   Wt 208 lb (94.3 kg)   SpO2 99%   BMI 36.85 kg/m   Constitutional:  Alert and oriented, No acute distress.   Laboratory Data: Lab Results  Component Value Date   WBC 11.1 (H) 10/17/2016  HGB 14.1 10/17/2016   HCT 41.9 10/17/2016   MCV 92.5 10/17/2016   PLT 274 10/17/2016    Lab Results  Component Value Date   CREATININE 0.77 04/28/2017    No results found for: PSA  No results found for: TESTOSTERONE  Lab Results  Component Value Date   HGBA1C 5.6 07/08/2017    Urinalysis    Component Value Date/Time   COLORURINE YELLOW (A) 10/17/2016 1540   APPEARANCEUR Clear 09/14/2017 1605   LABSPEC 1.020 10/17/2016 1540   LABSPEC 1.020 05/14/2012 1702   PHURINE 6.0 10/17/2016 1540   GLUCOSEU Negative 09/14/2017 1605   GLUCOSEU Negative 05/14/2012 1702   HGBUR SMALL (A) 10/17/2016 1540   BILIRUBINUR Negative 09/14/2017 1605   BILIRUBINUR Negative 05/14/2012 1702   KETONESUR 5 (A) 10/17/2016 1540   PROTEINUR Negative 09/14/2017 1605   PROTEINUR NEGATIVE 10/17/2016 1540   UROBILINOGEN 0.2 09/04/2015 1338   UROBILINOGEN 0.2 05/17/2009 1634   NITRITE Negative 09/14/2017 1605   NITRITE NEGATIVE 10/17/2016 1540   LEUKOCYTESUR Negative 09/14/2017 1605   LEUKOCYTESUR Negative 05/14/2012 1702    Pertinent Imaging: None  Assessment & Plan: The patient and I spoke about the fact that Botox is not helped her and it is causing or adding to retention symptoms on top of her voiding dysfunction symptom complex.  She understands that possibly over several weeks as the Botox wears off she may have transient overall benefit or not.  I will call the culture is positive.  Treatment expectations from the original InterStim in the multiple adjustments that have done by Maralyn Sago was discussed.  The patient knows how to  do this herself and will likely make adjustments and leave the settings for 1 or even 2 weeks.  Reassess in about 10 weeks.  I certainly can empathize with the patient but I have explained to her that these devices are not perfect and over time they often do not work as well explaining her my usual data point  1. Urinary urgency  - Urinalysis, Complete   No Follow-up on file.  Martina Sinner, MD  [facility] [address] [place], [place] [phone]

## 2017-10-01 LAB — CULTURE, URINE COMPREHENSIVE

## 2017-11-09 ENCOUNTER — Telehealth: Payer: Self-pay | Admitting: Urology

## 2017-11-09 NOTE — Telephone Encounter (Signed)
Pt called office asking for a Rx for Catheters to be called in for her.  Please advise.  [phone] or cell @ [phone],.Thanks.

## 2017-11-13 NOTE — Telephone Encounter (Signed)
Orders faxed to [facility].

## 2017-11-16 ENCOUNTER — Ambulatory Visit: Payer: Medicare Other

## 2017-11-25 DIAGNOSIS — R339 Retention of urine, unspecified: Secondary | ICD-10-CM | POA: Diagnosis not present

## 2017-12-07 ENCOUNTER — Ambulatory Visit (INDEPENDENT_AMBULATORY_CARE_PROVIDER_SITE_OTHER): Payer: Medicare Other | Admitting: Urology

## 2017-12-07 ENCOUNTER — Encounter: Payer: Self-pay | Admitting: Urology

## 2017-12-07 VITALS — BP 126/77 | HR 83 | Resp 16 | Ht 63.0 in | Wt 213.0 lb

## 2017-12-07 DIAGNOSIS — N3946 Mixed incontinence: Secondary | ICD-10-CM

## 2017-12-07 LAB — MICROSCOPIC EXAMINATION

## 2017-12-07 LAB — URINALYSIS, COMPLETE
Bilirubin, UA: NEGATIVE
GLUCOSE, UA: NEGATIVE
Ketones, UA: NEGATIVE
Leukocytes, UA: NEGATIVE
NITRITE UA: NEGATIVE
PH UA: 5 (ref 5.0–7.5)
Protein, UA: NEGATIVE
RBC, UA: NEGATIVE
Specific Gravity, UA: <1.005 — ABNORMAL LOW (ref 1.005–1.030)
Urobilinogen, Ur: 0.2 mg/dL (ref 0.2–1.0)

## 2017-12-07 MED ORDER — SULFAMETHOXAZOLE-TRIMETHOPRIM 800-160 MG PO TABS: 1.0000 | ORAL_TABLET | Freq: Two times a day (BID) | ORAL | 0 refills | Status: DC

## 2017-12-07 NOTE — Progress Notes (Signed)
12/07/2017 2:39 PM   Christina Ware 1951-04-17 [id number]  Referring provider: Ellyn Hack, MD [address] [place], [place] [address]  Chief Complaint  Patient presents with  . Urinary Urgency    HPI: On January 8the patient had Botox. She has an InterStim noted. She voids 8-14 times a day and 5 times at night. She has had a recent positive culture.   She actually finished a second course of an antibiotic empirically called in. The ciprofloxacin had been repeated. The culture was pansensitive  In the last 8 or 9 days she has had more flow symptoms. She will hesitate. She will then strain. She will stop and start. She does not feel that she can start her empty well. She was scanned today for 111 mL. She has had a catheterized but was not sure if she was getting into the bladder well enough and sometimes would only get a few milliliters  We walked her through catheterization today to make certain that she could and this is how we get the urine sent for culture.It is difficult if she truly is in retention or is having trouble voiding possibly from a bladder infection with small volume frequency. She got up more than 10 times last night  Based on working with the nurse today she knows how to catheterize. Small volume frequency from a bladder infection may explain this. I sent the urine for culture and called in Septra DS 1 tablet twice a day for 1 week. We will call her daily for a few days to check on her.   The patient is now catheterizing multiple times a day and at night.  She has trouble to initiate urination.  Her flow was poor.  She can strain.  She can catheterized for a cup Foley urine.  Clinically she is not infected but I will send a urine for culture  The patient and I spoke about the fact that Botox is not helped her and it is causing or adding to retention symptoms on top of her voiding dysfunction symptom complex.  She  understands that possibly over several weeks as the Botox wears off she may have transient overall benefit or not.  I will call the culture is positive.  Treatment expectations from the original InterStim in the multiple adjustments that have done by Maralyn Sago was discussed.  The patient knows how to do this herself and will likely make adjustments and leave the settings for 1 or even 2 weeks.  Reassess in about 10 weeks.  I certainly can empathize with the patient but I have explained to her that these devices are not perfect and over time they often do not work as well explaining her my usual data point  Today Urine culture February 18 negative The patient is catheterizing 8 times a day.  She can catheterize or go to the restroom every hour at night and denies ankle edema.  She can get a half a cup.  She can take 20 minutes and stop and start and voids small amounts.  Clinically did not have cystitis symptoms otherwise  The patient was catheterizing every hour at one point in time.  The patient's Botox was 3-1/2 months ago.  I suspect she will start voiding more spontaneously soon.  Unfortunately I do not have another good option for her.  Her nighttime frequency worsened in the last week.  Modifying factors: There are no other modifying factors  Associated signs and symptoms: There  are no other associated signs and symptoms Aggravating and relieving factors: There are no other aggravating or relieving factors Severity: Moderate Duration: Persistent     PMH: Past Medical History:  Diagnosis Date  . Chronic back pain greater than 3 months duration    Degenerative Disc Disease, surgery on lower back.  . Chronic neck pain    Ruptured disc in neck  . Prediabetes     Surgical History: Past Surgical History:  Procedure Laterality Date  . BACK SURGERY  2005   lower back  . FOOT SURGERY Bilateral    twice  . KNEE ARTHROSCOPY Right     Home Medications:  Allergies as of 12/07/2017       Reactions   Codeine Other (See Comments)   Hydrocodone    Latex       Medication List        Accurate as of 12/07/17  2:39 PM. Always use your most recent med list.          ACETYL L-CARNITINE PO Take 400 mg by mouth.   Alpha-Lipoic Acid 200 MG Tabs Take by mouth.   [facility] BLACK COHOSH 540 MG Caps Generic drug:  Black Cohosh Take by mouth.   Desmopressin Acetate 1.66 MCG/0.1ML Emul Commonly known as:  NOCTIVA Place 1 spray into the nose at bedtime.   docusate sodium 100 MG capsule Commonly known as:  COLACE Take by mouth.   fentaNYL 50 MCG/HR Commonly known as:  DURAGESIC - dosed mcg/hr   fentaNYL 25 MCG/HR patch Commonly known as:  DURAGESIC - dosed mcg/hr APPLY 1 PATCH ONTO SKIN EVERY 72 HOURS   fentaNYL 75 MCG/HR Commonly known as:  DURAGESIC - dosed mcg/hr Place 25 mcg onto the skin.   Fish Oil 1000 MG Caps Take by mouth.   gabapentin 800 MG tablet Commonly known as:  NEURONTIN   L-Arginine 500 MG Caps Take by mouth.   mometasone 50 MCG/ACT nasal spray Commonly known as:  NASONEX Place 2 sprays into the nose daily.   Oxycodone HCl 10 MG Tabs oxycodone 10 mg tablet  Take 1 tablet 4 times a day by oral route as needed.   Oxycodone HCl 10 MG Tabs   PARoxetine 20 MG tablet Commonly known as:  PAXIL Take 1 tablet (20 mg total) by mouth daily.   sulfamethoxazole-trimethoprim 800-160 MG tablet Commonly known as:  BACTRIM DS,SEPTRA DS Take 1 tablet by mouth every 12 (twelve) hours.   sulfamethoxazole-trimethoprim 800-160 MG tablet Commonly known as:  BACTRIM DS,SEPTRA DS Take 1 tablet by mouth 2 (two) times daily.   tiZANidine 4 MG tablet Commonly known as:  ZANAFLEX Reported on 02/25/2016   vitamin E 400 UNIT capsule Take 400 Units by mouth daily. Reported on 02/25/2016       Allergies:  Allergies  Allergen Reactions  . Codeine Other (See Comments)  . Hydrocodone   . Latex     Family History: Family History  Problem Relation Age of  Onset  . Diabetes Mother   . Cancer Mother   . Hypertension Mother   . Diabetes Father   . Cancer Father   . Hypertension Father   . Diabetes Sister   . Hypertension Sister   . Diabetes Brother   . Cancer Brother   . Hypertension Brother   . Diabetes Brother   . Birth defects Brother        born with a hole in his heart  . Lung disease Brother  patient needed a lung transplant  . Heart disease Brother        needed a heart transplant    Social History:  reports that she has quit smoking. She has never used smokeless tobacco. She reports that she does not drink alcohol or use drugs.  ROS: UROLOGY Frequent Urination?: Yes Hard to postpone urination?: No Burning/pain with urination?: No Get up at night to urinate?: Yes Leakage of urine?: No Urine stream starts and stops?: Yes Trouble starting stream?: Yes Do you have to strain to urinate?: Yes Blood in urine?: No Urinary tract infection?: No Sexually transmitted disease?: No Injury to kidneys or bladder?: No Painful intercourse?: No Weak stream?: No Currently pregnant?: No Vaginal bleeding?: No Last menstrual period?: n  Gastrointestinal Nausea?: No Vomiting?: No Indigestion/heartburn?: No Diarrhea?: No Constipation?: No  Constitutional Fever: No Night sweats?: No Weight loss?: No Fatigue?: No  Skin Skin rash/lesions?: No Itching?: No  Eyes Blurred vision?: No Double vision?: No  Ears/Nose/Throat Sore throat?: No Sinus problems?: No  Hematologic/Lymphatic Swollen glands?: No Easy bruising?: No  Cardiovascular Leg swelling?: No Chest pain?: No  Respiratory Shortness of breath?: No  Endocrine Excessive thirst?: Yes  Musculoskeletal Back pain?: Yes Joint pain?: No  Neurological Headaches?: No Dizziness?: Yes  Psychologic Depression?: No Anxiety?: No  Physical Exam: BP 126/77   Pulse 83   Resp 16   Ht  (1.6 m)   Wt 213 lb (96.6 kg)   SpO2 98%   BMI 37.73 kg/m       Laboratory Data: Lab Results  Component Value Date   WBC 11.1 (H) 10/17/2016   HGB 14.1 10/17/2016   HCT 41.9 10/17/2016   MCV 92.5 10/17/2016   PLT 274 10/17/2016    Lab Results  Component Value Date   CREATININE 0.77 04/28/2017    No results found for: PSA  No results found for: TESTOSTERONE  Lab Results  Component Value Date   HGBA1C 5.6 07/08/2017    Urinalysis    Component Value Date/Time   COLORURINE YELLOW (A) 10/17/2016 1540   APPEARANCEUR Clear 09/28/2017 1500   LABSPEC 1.020 10/17/2016 1540   LABSPEC 1.020 05/14/2012 1702   PHURINE 6.0 10/17/2016 1540   GLUCOSEU Negative 09/28/2017 1500   GLUCOSEU Negative 05/14/2012 1702   HGBUR SMALL (A) 10/17/2016 1540   BILIRUBINUR Negative 09/28/2017 1500   BILIRUBINUR Negative 05/14/2012 1702   KETONESUR 5 (A) 10/17/2016 1540   PROTEINUR Negative 09/28/2017 1500   PROTEINUR NEGATIVE 10/17/2016 1540   UROBILINOGEN 0.2 09/04/2015 1338   UROBILINOGEN 0.2 05/17/2009 1634   NITRITE Negative 09/28/2017 1500   NITRITE NEGATIVE 10/17/2016 1540   LEUKOCYTESUR Negative 09/28/2017 1500   LEUKOCYTESUR Negative 05/14/2012 1702    Pertinent Imaging:   Assessment & Plan: Reassess in 3 months.  I got a cath urine and I will call if positive recognizing it could represent colonization.  Unfortunately I do not have another good option.  Natural history of Botox discussed.  There are no diagnoses linked to this encounter.  No follow-ups on file.  Martina Sinner, MD  [facility] [address], [address] [place], [place] [address] [phone]

## 2017-12-22 ENCOUNTER — Ambulatory Visit (INDEPENDENT_AMBULATORY_CARE_PROVIDER_SITE_OTHER): Payer: Medicare Other | Admitting: Family Medicine

## 2017-12-22 ENCOUNTER — Encounter: Payer: Self-pay | Admitting: Family Medicine

## 2017-12-22 VITALS — BP 114/74 | HR 79 | Temp 98.3°F | Resp 14 | Ht 63.0 in | Wt 213.5 lb

## 2017-12-22 DIAGNOSIS — Z5181 Encounter for therapeutic drug level monitoring: Secondary | ICD-10-CM | POA: Diagnosis not present

## 2017-12-22 DIAGNOSIS — Z1231 Encounter for screening mammogram for malignant neoplasm of breast: Secondary | ICD-10-CM

## 2017-12-22 DIAGNOSIS — E669 Obesity, unspecified: Secondary | ICD-10-CM

## 2017-12-22 DIAGNOSIS — Z6837 Body mass index (BMI) 37.0-37.9, adult: Secondary | ICD-10-CM | POA: Insufficient documentation

## 2017-12-22 DIAGNOSIS — R7303 Prediabetes: Secondary | ICD-10-CM | POA: Diagnosis not present

## 2017-12-22 DIAGNOSIS — Z1239 Encounter for other screening for malignant neoplasm of breast: Secondary | ICD-10-CM

## 2017-12-22 MED ORDER — DULOXETINE HCL 30 MG PO CPEP: 30.0000 mg | ORAL_CAPSULE | Freq: Every day | ORAL | 0 refills | Status: DC

## 2017-12-22 MED ORDER — TRIAMCINOLONE ACETONIDE 0.5 % EX OINT: 1.0000 "application " | TOPICAL_OINTMENT | Freq: Two times a day (BID) | CUTANEOUS | 0 refills | Status: DC

## 2017-12-22 NOTE — Assessment & Plan Note (Signed)
See AVS

## 2017-12-22 NOTE — Assessment & Plan Note (Addendum)
See AVS; she declined referral to dietician, interested in pills

## 2017-12-22 NOTE — Patient Instructions (Addendum)
Let the doctor know about the 25 mcg and 50 mcg fentanyl patch issue Start the Cymbalta for mood and pain; take 30 mg daily for the first week, then take two pills together to equal 60 mg daily  Check out the information at familydoctor.org entitled "Nutrition for Weight Loss: What You Need to Know about Fad Diets" Try to lose between 1-2 pounds per week by taking in fewer calories and burning off more calories You can succeed by limiting portions, limiting foods dense in calories and fat, becoming more active, and drinking 8 glasses of water a day (64 ounces) Don't skip meals, especially breakfast, as skipping meals may alter your metabolism Do not use over-the-counter weight loss pills or gimmicks that claim rapid weight loss A healthy BMI (or body mass index) is between 18.5 and 24.9 You can calculate your ideal BMI at the NIH website JobEconomics.hu  Check out MyFitnessPal or other weight loss app like LoseIt or Fooducate Calorie count for one week, everything that goes in your mouth Let's check labs today Try to increase activity Obesity, Adult Obesity is the condition of having too much total body fat. Being overweight or obese means that your weight is greater than what is considered healthy for your body size. Obesity is determined by a measurement called BMI. BMI is an estimate of body fat and is calculated from height and weight. For adults, a BMI of 30 or higher is considered obese. Obesity can eventually lead to other health concerns and major illnesses, including:  Stroke.  Coronary artery disease (CAD).  Type 2 diabetes.  Some types of cancer, including cancers of the colon, breast, uterus, and gallbladder.  Osteoarthritis.  High blood pressure (hypertension).  High cholesterol.  Sleep apnea.  Gallbladder stones.  Infertility problems.  What are the causes? The main cause of obesity is taking in (consuming) more  calories than your body uses for energy. Other factors that contribute to this condition may include:  Being born with genes that make you more likely to become obese.  Having a medical condition that causes obesity. These conditions include: ? Hypothyroidism. ? Polycystic ovarian syndrome (PCOS). ? Binge-eating disorder. ? Cushing syndrome.  Taking certain medicines, such as steroids, antidepressants, and seizure medicines.  Not being physically active (sedentary lifestyle).  Living where there are limited places to exercise safely or buy healthy foods.  Not getting enough sleep.  What increases the risk? The following factors may increase your risk of this condition:  Having a family history of obesity.  Being a woman of African-American descent.  Being a man of Hispanic descent.  What are the signs or symptoms? Having excessive body fat is the main symptom of this condition. How is this diagnosed? This condition may be diagnosed based on:  Your symptoms.  Your medical history.  A physical exam. Your health care provider may measure: ? Your BMI. If you are an adult with a BMI between 25 and less than 30, you are considered overweight. If you are an adult with a BMI of 30 or higher, you are considered obese. ? The distances around your hips and your waist (circumferences). These may be compared to each other to help diagnose your condition. ? Your skinfold thickness. Your health care provider may gently pinch a fold of your skin and measure it.  How is this treated? Treatment for this condition often includes changing your lifestyle. Treatment may include some or all of the following:  Dietary changes. Work with your  health care provider and a dietitian to set a weight-loss goal that is healthy and reasonable for you. Dietary changes may include eating: ? Smaller portions. A portion size is the amount of a particular food that is healthy for you to eat at one time. This  varies from person to person. ? Low-calorie or low-fat options. ? More whole grains, fruits, and vegetables.  Regular physical activity. This may include aerobic activity (cardio) and strength training.  Medicine to help you lose weight. Your health care provider may prescribe medicine if you are unable to lose 1 pound a week after 6 weeks of eating more healthily and doing more physical activity.  Surgery. Surgical options may include gastric banding and gastric bypass. Surgery may be done if: ? Other treatments have not helped to improve your condition. ? You have a BMI of 40 or higher. ? You have life-threatening health problems related to obesity.  Follow these instructions at home:  Eating and drinking   Follow recommendations from your health care provider about what you eat and drink. Your health care provider may advise you to: ? Limit fast foods, sweets, and processed snack foods. ? Choose low-fat options, such as low-fat milk instead of whole milk. ? Eat 5 or more servings of fruits or vegetables every day. ? Eat at home more often. This gives you more control over what you eat. ? Choose healthy foods when you eat out. ? Learn what a healthy portion size is. ? Keep low-fat snacks on hand. ? Avoid sugary drinks, such as soda, fruit juice, iced tea sweetened with sugar, and flavored milk. ? Eat a healthy breakfast.  Drink enough water to keep your urine clear or pale yellow.  Do not go without eating for long periods of time (do not fast) or follow a fad diet. Fasting and fad diets can be unhealthy and even dangerous. Physical Activity  Exercise regularly, as told by your health care provider. Ask your health care provider what types of exercise are safe for you and how often you should exercise.  Warm up and stretch before being active.  Cool down and stretch after being active.  Rest between periods of activity. Lifestyle  Limit the time that you spend in front of  your TV, computer, or video game system.  Find ways to reward yourself that do not involve food.  Limit alcohol intake to no more than 1 drink a day for nonpregnant women and 2 drinks a day for men. One drink equals 12 oz of beer, 5 oz of wine, or 1 oz of hard liquor. General instructions  Keep a weight loss journal to keep track of the food you eat and how much you exercise you get.  Take over-the-counter and prescription medicines only as told by your health care provider.  Take vitamins and supplements only as told by your health care provider.  Consider joining a support group. Your health care provider may be able to recommend a support group.  Keep all follow-up visits as told by your health care provider. This is important. Contact a health care provider if:  You are unable to meet your weight loss goal after 6 weeks of dietary and lifestyle changes. This information is not intended to replace advice given to you by your health care provider. Make sure you discuss any questions you have with your health care provider. Document Released: 09/04/2004 Document Revised: 12/31/2015 Document Reviewed: 05/16/2015 Elsevier Interactive Patient Education  2018 [employer].  Preventing Unhealthy Weight Gain, Adult Staying at a healthy weight is important. When fat builds up in your body, you may become overweight or obese. These conditions put you at greater risk for developing certain health problems, such as heart disease, diabetes, sleeping problems, joint problems, and some cancers. Unhealthy weight gain is often the result of making unhealthy choices in what you eat. It is also a result of not getting enough exercise. You can make changes to your lifestyle to prevent obesity and stay as healthy as possible. What nutrition changes can be made? To maintain a healthy weight and prevent obesity:  Eat only as much as your body needs. To do this: ? Pay attention to signs that you are  hungry or full. Stop eating as soon as you feel full. ? If you feel hungry, try drinking water first. Drink enough water so your urine is clear or pale yellow. ? Eat smaller portions. ? Look at serving sizes on food labels. Most foods contain more than one serving per container. ? Eat the recommended amount of calories for your gender and activity level. While most active people should eat around 2,000 calories per day, if you are trying to lose weight or are not very active, you main need to eat less calories. Talk to your health care provider or dietitian about how many calories you should eat each day.  Choose healthy foods, such as: ? Fruits and vegetables. Try to fill at least half of your plate at each meal with fruits and vegetables. ? Whole grains, such as whole wheat bread, brown rice, and quinoa. ? Lean meats, such as chicken or fish. ? Other healthy proteins, such as beans, eggs, or tofu. ? Healthy fats, such as nuts, seeds, fatty fish, and olive oil. ? Low-fat or fat-free dairy.  Check food labels and avoid food and drinks that: ? Are high in calories. ? Have added sugar. ? Are high in sodium. ? Have saturated fats or trans fats.  Limit how much you eat of the following foods: ? Prepackaged meals. ? Fast food. ? Fried foods. ? Processed meat, such as bacon, sausage, and deli meats. ? Fatty cuts of red meat and poultry with skin.  Cook foods in healthier ways, such as by baking, broiling, or grilling.  When grocery shopping, try to shop around the outside of the store. This helps you buy mostly fresh foods and avoid canned and prepackaged foods.  What lifestyle changes can be made?  Exercise at least 30 minutes 5 or more days each week. Exercising includes brisk walking, yard work, biking, running, swimming, and team sports like basketball and soccer. Ask your health care provider which exercises are safe for you.  Do not use any products that contain nicotine or  tobacco, such as cigarettes and e-cigarettes. If you need help quitting, ask your health care provider.  Limit alcohol intake to no more than 1 drink a day for nonpregnant women and 2 drinks a day for men. One drink equals 12 oz of beer, 5 oz of wine, or 1 oz of hard liquor.  Try to get 7-9 hours of sleep each night. What other changes can be made?  Keep a food and activity journal to keep track of: ? What you ate and how many calories you had. Remember to count sauces, dressings, and side dishes. ? Whether you were active, and what exercises you did. ? Your calorie, weight, and activity goals.  Check your weight regularly. Track any  changes. If you notice you have gained weight, make changes to your diet or activity routine.  Avoid taking weight-loss medicines or supplements. Talk to your health care provider before starting any new medicine or supplement.  Talk to your health care provider before trying any new diet or exercise plan. Why are these changes important? Eating healthy, staying active, and having healthy habits not only help prevent obesity, they also:  Help you to manage stress and emotions.  Help you to connect with friends and family.  Improve your self-esteem.  Improve your sleep.  Prevent long-term health problems.  What can happen if changes are not made? Being obese or overweight can cause you to develop joint or bone problems, which can make it hard for you to stay active or do activities you enjoy. Being obese or overweight also puts stress on your heart and lungs and can lead to health problems like diabetes, heart disease, and some cancers. Where to find more information: Talk with your health care provider or a dietitian about healthy eating and healthy lifestyle choices. You may also find other information through these resources:  U.S. Department of Agriculture MyPlate: https://ball-collins.biz/  American Heart Association: www.heart.org  Centers for  Disease Control and Prevention: FootballExhibition.com.br  Summary  Staying at a healthy weight is important. It helps prevent certain diseases and health problems, such as heart disease, diabetes, joint problems, sleep disorders, and some cancers.  Being obese or overweight can cause you to develop joint or bone problems, which can make it hard for you to stay active or do activities you enjoy.  You can prevent unhealthy weight gain by eating a healthy diet, exercising regularly, not smoking, limiting alcohol, and getting enough sleep.  Talk with your health care provider or a dietitian for guidance about healthy eating and healthy lifestyle choices. This information is not intended to replace advice given to you by your health care provider. Make sure you discuss any questions you have with your health care provider. Document Released: 07/29/2016 Document Revised: 09/03/2016 Document Reviewed: 09/03/2016 Elsevier Interactive Patient Education  [employer].

## 2017-12-22 NOTE — Assessment & Plan Note (Signed)
Check A1c and glucose; work on weight loss 

## 2017-12-22 NOTE — Progress Notes (Signed)
BP 114/74   Pulse 79   Temp 98.3 F (36.8 C) (Oral)   Resp 14   Ht  (1.6 m)   Wt 213 lb 8 oz (96.8 kg)   SpO2 94%   BMI 37.82 kg/m    Subjective:    Patient ID: Christina Ware, female    DOB: [birth year], 67 y.o.   MRN: [id number]  HPI: Christina Ware is a 67 y.o. female  Chief Complaint  Patient presents with  . Referral    mammo no problems  . Obesity    would like to get on weight loss suplement  . Excessive Sweating    HPI  She is here for an order mammogram; no lumps or bumps; gets her mammogram every year Last mammogram Feb 2018; nulliparous; not drinking alcohol  Obesity; eats fruits and vegetables; does not want to see nutritionist; eats a lot of fruit and veggies; last calorie count was [phone] she says; drinks a lot of water  Used to stay around 170 or 180 pounds; took paxil and gained weight; stopped paxil and can't get the weight off She is sedentary, but can't do a lot for back problems; able to walk, but does better with something to hold on to and lean ont to; has spinal stenosis Reviewed the CT lumbar spine w/contrast; Dr. Ethelene Hal is managing this; she used to be so active until she got all this mess; about 3 years ago, couldn't stand up straight; she had fusion L3 through S1; does not want more surgery; multiple levels, back and neck; runs in the family; just cries all the time from h er back; not normal for me she says  Reviewed med list; no infection; last ABX was an error  She is on fentanyl; she was on 25 mcg for a while; they keep sending in the 50 mcg strength  Depression screen [facility] 2/9 12/22/2017 07/08/2017 04/07/2017 07/08/2016 06/02/2016  Decreased Interest 0 3 0 0 0  Down, Depressed, Hopeless 1 3 0 3 1  PHQ - 2 Score 1 6 0 3 1  Altered sleeping - 0 - 0 -  Tired, decreased energy - 0 - 3 -  Change in appetite - 0 - 3 -  Feeling bad or failure about yourself  - 1 - 0 -  Trouble concentrating - 0 - 0 -  Moving slowly or  fidgety/restless - 0 - 1 -  Suicidal thoughts - 1 - 0 -  PHQ-9 Score - 8 - 10 -  Difficult doing work/chores - Somewhat difficult - Somewhat difficult -    Relevant past medical, surgical, family and social history reviewed Past Medical History:  Diagnosis Date  . Chronic back pain greater than 3 months duration    Degenerative Disc Disease, surgery on lower back.  . Chronic neck pain    Ruptured disc in neck  . Prediabetes    Past Surgical History:  Procedure Laterality Date  . BACK SURGERY  2005   lower back  . FOOT SURGERY Bilateral    twice  . KNEE ARTHROSCOPY Right    Family History  Problem Relation Age of Onset  . Diabetes Mother   . Cancer Mother   . Hypertension Mother   . Diabetes Father   . Cancer Father   . Hypertension Father   . Diabetes Sister   . Hypertension Sister   . Diabetes Brother   . Cancer Brother   . Hypertension Brother   . Diabetes  Brother   . Birth defects Brother        born with a hole in his heart  . Lung disease Brother        patient needed a lung transplant  . Heart disease Brother        needed a heart transplant   Social History   Tobacco Use  . Smoking status: Former Games developer  . Smokeless tobacco: Never Used  . Tobacco comment: quit when she was 67yrs old  Substance Use Topics  . Alcohol use: No    Alcohol/week: 0.0 oz  . Drug use: No    Interim medical history since last visit reviewed. Allergies and medications reviewed  Review of Systems Per HPI unless specifically indicated above     Objective:    BP 114/74   Pulse 79   Temp 98.3 F (36.8 C) (Oral)   Resp 14   Ht  (1.6 m)   Wt 213 lb 8 oz (96.8 kg)   SpO2 94%   BMI 37.82 kg/m   Wt Readings from Last 3 Encounters:  12/22/17 213 lb 8 oz (96.8 kg)  12/07/17 213 lb (96.6 kg)  09/28/17 208 lb (94.3 kg)    Physical Exam  Constitutional: She appears well-developed and well-nourished.  HENT:  Mouth/Throat: Mucous membranes are normal.  Eyes: EOM  are normal. No scleral icterus.  Cardiovascular: Normal rate and regular rhythm.  Pulmonary/Chest: Effort normal and breath sounds normal.  Psychiatric: She has a normal mood and affect. Her behavior is normal.        Assessment & Plan:   Problem List Items Addressed This Visit      Other   Prediabetes    Check A1c and glucose; work on weight loss      Relevant Orders   Hemoglobin A1c (Completed)   Obesity (BMI 35.0-39.9 without comorbidity)    See AVS; she declined referral to dietician, interested in pills      Relevant Orders   TSH (Completed)   Lipid panel (Completed)   BMI 37.0-37.9, adult    See AVS       Other Visit Diagnoses    Screening for breast cancer    -  Primary   Relevant Orders   MM Digital Screening   Medication monitoring encounter       Relevant Orders   COMPLETE METABOLIC PANEL WITH GFR (Completed)       Follow up plan: Return in about 1 month (around 01/19/2018) for follow-up visit with Dr. Sherie Don.  An after-visit summary was printed and given to the patient at check-out.  Please see the patient instructions which may contain other information and recommendations beyond what is mentioned above in the assessment and plan.  Meds ordered this encounter  Medications  . DULoxetine (CYMBALTA) 30 MG capsule    Sig: Take 1 capsule (30 mg total) by mouth daily. For one week, then two a day    Dispense:  53 capsule    Refill:  0  . triamcinolone ointment (KENALOG) 0.5 %    Sig: Apply 1 application topically 2 (two) times daily. If needed; too strong for face, underams, groin    Dispense:  30 g    Refill:  0    Orders Placed This Encounter  Procedures  . MM Digital Screening  . TSH  . Hemoglobin A1c  . Lipid panel  . COMPLETE METABOLIC PANEL WITH GFR

## 2017-12-29 DIAGNOSIS — M961 Postlaminectomy syndrome, not elsewhere classified: Secondary | ICD-10-CM | POA: Diagnosis not present

## 2017-12-29 DIAGNOSIS — G894 Chronic pain syndrome: Secondary | ICD-10-CM | POA: Diagnosis not present

## 2017-12-29 DIAGNOSIS — M545 Low back pain: Secondary | ICD-10-CM | POA: Diagnosis not present

## 2017-12-29 DIAGNOSIS — Z79891 Long term (current) use of opiate analgesic: Secondary | ICD-10-CM | POA: Diagnosis not present

## 2017-12-31 LAB — COMPLETE METABOLIC PANEL WITH GFR
AG Ratio: 1.4 (calc) (ref 1.0–2.5)
ALT: 14 U/L (ref 6–29)
AST: 18 U/L (ref 10–35)
Albumin: 4.2 g/dL (ref 3.6–5.1)
Alkaline phosphatase (APISO): 65 U/L (ref 33–130)
BUN: 17 mg/dL (ref 7–25)
CALCIUM: 9.6 mg/dL (ref 8.6–10.4)
CO2: 29 mmol/L (ref 20–32)
CREATININE: 0.7 mg/dL (ref 0.50–0.99)
Chloride: 103 mmol/L (ref 98–110)
GFR, Est African American: 105 mL/min/{1.73_m2} (ref >=60)
GFR, Est Non African American: 91 mL/min/{1.73_m2} (ref >=60)
GLOBULIN: 3 g/dL (ref 1.9–3.7)
Glucose, Bld: 113 mg/dL (ref 65–139)
Potassium: 4.2 mmol/L (ref 3.5–5.3)
SODIUM: 139 mmol/L (ref 135–146)
Total Bilirubin: 0.2 mg/dL (ref 0.2–1.2)
Total Protein: 7.2 g/dL (ref 6.1–8.1)

## 2017-12-31 LAB — LIPID PANEL
CHOL/HDL RATIO: 2.6 (calc) (ref <5.0)
CHOLESTEROL: 189 mg/dL (ref <200)
HDL: 74 mg/dL (ref >50)
LDL Cholesterol (Calc): 92 mg/dL (calc)
Non-HDL Cholesterol (Calc): 115 mg/dL (calc) (ref <130)
Triglycerides: 125 mg/dL (ref <150)

## 2017-12-31 LAB — HEMOGLOBIN A1C
HEMOGLOBIN A1C: 6 %{Hb} — AB (ref <5.7)
Mean Plasma Glucose: 126 (calc)
eAG (mmol/L): 7 (calc)

## 2017-12-31 LAB — TSH: TSH: 1.14 mIU/L (ref 0.40–4.50)

## 2018-01-15 ENCOUNTER — Other Ambulatory Visit: Payer: Self-pay | Admitting: Family Medicine

## 2018-01-15 NOTE — Telephone Encounter (Signed)
FYI

## 2018-01-15 NOTE — Telephone Encounter (Signed)
Pt is going out of town on Tuesday for two weeks.

## 2018-01-16 MED ORDER — DULOXETINE HCL 60 MG PO CPEP: 60.0000 mg | ORAL_CAPSULE | Freq: Every day | ORAL | 0 refills | Status: DC

## 2018-01-22 ENCOUNTER — Ambulatory Visit: Payer: Medicare Other | Admitting: Family Medicine

## 2018-02-08 ENCOUNTER — Encounter: Payer: Self-pay | Admitting: Family Medicine

## 2018-02-08 ENCOUNTER — Ambulatory Visit (INDEPENDENT_AMBULATORY_CARE_PROVIDER_SITE_OTHER): Payer: Medicare Other | Admitting: Family Medicine

## 2018-02-08 DIAGNOSIS — M5136 Other intervertebral disc degeneration, lumbar region: Secondary | ICD-10-CM

## 2018-02-08 DIAGNOSIS — M5030 Other cervical disc degeneration, unspecified cervical region: Secondary | ICD-10-CM | POA: Insufficient documentation

## 2018-02-08 DIAGNOSIS — R7303 Prediabetes: Secondary | ICD-10-CM

## 2018-02-08 DIAGNOSIS — E669 Obesity, unspecified: Secondary | ICD-10-CM

## 2018-02-08 MED ORDER — DULOXETINE HCL 60 MG PO CPEP: 60.0000 mg | ORAL_CAPSULE | Freq: Every day | ORAL | 5 refills | Status: DC

## 2018-02-08 NOTE — Assessment & Plan Note (Signed)
Praise given for her weight loss; she'll continue to work on this

## 2018-02-08 NOTE — Patient Instructions (Addendum)
Check out the information at familydoctor.org entitled "Nutrition for Weight Loss: What You Need to Know about Fad Diets" Try to lose between 1-2 pounds per week by taking in fewer calories and burning off more calories You can succeed by limiting portions, limiting foods dense in calories and fat, becoming more active, and drinking 8 glasses of water a day (64 ounces) Don't skip meals, especially breakfast, as skipping meals may alter your metabolism Do not use over-the-counter weight loss pills or gimmicks that claim rapid weight loss A healthy BMI (or body mass index) is between 18.5 and 24.9 You can calculate your ideal BMI at the NIH website http://www.nhlbi.nih.gov/health/educational/lose_wt/BMI/bmicalc.htm  Obesity, Adult Obesity is the condition of having too much total body fat. Being overweight or obese means that your weight is greater than what is considered healthy for your body size. Obesity is determined by a measurement called BMI. BMI is an estimate of body fat and is calculated from height and weight. For adults, a BMI of 30 or higher is considered obese. Obesity can eventually lead to other health concerns and major illnesses, including:  Stroke.  Coronary artery disease (CAD).  Type 2 diabetes.  Some types of cancer, including cancers of the colon, breast, uterus, and gallbladder.  Osteoarthritis.  High blood pressure (hypertension).  High cholesterol.  Sleep apnea.  Gallbladder stones.  Infertility problems.  What are the causes? The main cause of obesity is taking in (consuming) more calories than your body uses for energy. Other factors that contribute to this condition may include:  Being born with genes that make you more likely to become obese.  Having a medical condition that causes obesity. These conditions include: ? Hypothyroidism. ? Polycystic ovarian syndrome (PCOS). ? Binge-eating disorder. ? Cushing syndrome.  Taking certain medicines, such  as steroids, antidepressants, and seizure medicines.  Not being physically active (sedentary lifestyle).  Living where there are limited places to exercise safely or buy healthy foods.  Not getting enough sleep.  What increases the risk? The following factors may increase your risk of this condition:  Having a family history of obesity.  Being a woman of African-American descent.  Being a man of Hispanic descent.  What are the signs or symptoms? Having excessive body fat is the main symptom of this condition. How is this diagnosed? This condition may be diagnosed based on:  Your symptoms.  Your medical history.  A physical exam. Your health care provider may measure: ? Your BMI. If you are an adult with a BMI between 25 and less than 30, you are considered overweight. If you are an adult with a BMI of 30 or higher, you are considered obese. ? The distances around your hips and your waist (circumferences). These may be compared to each other to help diagnose your condition. ? Your skinfold thickness. Your health care provider may gently pinch a fold of your skin and measure it.  How is this treated? Treatment for this condition often includes changing your lifestyle. Treatment may include some or all of the following:  Dietary changes. Work with your health care provider and a dietitian to set a weight-loss goal that is healthy and reasonable for you. Dietary changes may include eating: ? Smaller portions. A portion size is the amount of a particular food that is healthy for you to eat at one time. This varies from person to person. ? Low-calorie or low-fat options. ? More whole grains, fruits, and vegetables.  Regular physical activity. This may include   aerobic activity (cardio) and strength training.  Medicine to help you lose weight. Your health care provider may prescribe medicine if you are unable to lose 1 pound a week after 6 weeks of eating more healthily and doing  more physical activity.  Surgery. Surgical options may include gastric banding and gastric bypass. Surgery may be done if: ? Other treatments have not helped to improve your condition. ? You have a BMI of 40 or higher. ? You have life-threatening health problems related to obesity.  Follow these instructions at home:  Eating and drinking   Follow recommendations from your health care provider about what you eat and drink. Your health care provider may advise you to: ? Limit fast foods, sweets, and processed snack foods. ? Choose low-fat options, such as low-fat milk instead of whole milk. ? Eat 5 or more servings of fruits or vegetables every day. ? Eat at home more often. This gives you more control over what you eat. ? Choose healthy foods when you eat out. ? Learn what a healthy portion size is. ? Keep low-fat snacks on hand. ? Avoid sugary drinks, such as soda, fruit juice, iced tea sweetened with sugar, and flavored milk. ? Eat a healthy breakfast.  Drink enough water to keep your urine clear or pale yellow.  Do not go without eating for long periods of time (do not fast) or follow a fad diet. Fasting and fad diets can be unhealthy and even dangerous. Physical Activity  Exercise regularly, as told by your health care provider. Ask your health care provider what types of exercise are safe for you and how often you should exercise.  Warm up and stretch before being active.  Cool down and stretch after being active.  Rest between periods of activity. Lifestyle  Limit the time that you spend in front of your TV, computer, or video game system.  Find ways to reward yourself that do not involve food.  Limit alcohol intake to no more than 1 drink a day for nonpregnant women and 2 drinks a day for men. One drink equals 12 oz of beer, 5 oz of wine, or 1 oz of hard liquor. General instructions  Keep a weight loss journal to keep track of the food you eat and how much you  exercise you get.  Take over-the-counter and prescription medicines only as told by your health care provider.  Take vitamins and supplements only as told by your health care provider.  Consider joining a support group. Your health care provider may be able to recommend a support group.  Keep all follow-up visits as told by your health care provider. This is important. Contact a health care provider if:  You are unable to meet your weight loss goal after 6 weeks of dietary and lifestyle changes. This information is not intended to replace advice given to you by your health care provider. Make sure you discuss any questions you have with your health care provider. Document Released: 09/04/2004 Document Revised: 12/31/2015 Document Reviewed: 05/16/2015 Elsevier Interactive Patient Education  2018 [employer] Inc[employer]  Preventing Unhealthy Weight Gain, Adult Staying at a healthy weight is important. When fat builds up in your body, you may become overweight or obese. These conditions put you at greater risk for developing certain health problems, such as heart disease, diabetes, sleeping problems, joint problems, and some cancers. Unhealthy weight gain is often the result of making unhealthy choices in what you eat. It is also a result of not   getting enough exercise. You can make changes to your lifestyle to prevent obesity and stay as healthy as possible. What nutrition changes can be made? To maintain a healthy weight and prevent obesity:  Eat only as much as your body needs. To do this: ? Pay attention to signs that you are hungry or full. Stop eating as soon as you feel full. ? If you feel hungry, try drinking water first. Drink enough water so your urine is clear or pale yellow. ? Eat smaller portions. ? Look at serving sizes on food labels. Most foods contain more than one serving per container. ? Eat the recommended amount of calories for your gender and activity level. While most active  people should eat around 2,000 calories per day, if you are trying to lose weight or are not very active, you main need to eat less calories. Talk to your health care provider or dietitian about how many calories you should eat each day.  Choose healthy foods, such as: ? Fruits and vegetables. Try to fill at least half of your plate at each meal with fruits and vegetables. ? Whole grains, such as whole wheat bread, brown rice, and quinoa. ? Lean meats, such as chicken or fish. ? Other healthy proteins, such as beans, eggs, or tofu. ? Healthy fats, such as nuts, seeds, fatty fish, and olive oil. ? Low-fat or fat-free dairy.  Check food labels and avoid food and drinks that: ? Are high in calories. ? Have added sugar. ? Are high in sodium. ? Have saturated fats or trans fats.  Limit how much you eat of the following foods: ? Prepackaged meals. ? Fast food. ? Fried foods. ? Processed meat, such as bacon, sausage, and deli meats. ? Fatty cuts of red meat and poultry with skin.  Cook foods in healthier ways, such as by baking, broiling, or grilling.  When grocery shopping, try to shop around the outside of the store. This helps you buy mostly fresh foods and avoid canned and prepackaged foods.  What lifestyle changes can be made?  Exercise at least 30 minutes 5 or more days each week. Exercising includes brisk walking, yard work, biking, running, swimming, and team sports like basketball and soccer. Ask your health care provider which exercises are safe for you.  Do not use any products that contain nicotine or tobacco, such as cigarettes and e-cigarettes. If you need help quitting, ask your health care provider.  Limit alcohol intake to no more than 1 drink a day for nonpregnant women and 2 drinks a day for men. One drink equals 12 oz of beer, 5 oz of wine, or 1 oz of hard liquor.  Try to get 7-9 hours of sleep each night. What other changes can be made?  Keep a food and activity  journal to keep track of: ? What you ate and how many calories you had. Remember to count sauces, dressings, and side dishes. ? Whether you were active, and what exercises you did. ? Your calorie, weight, and activity goals.  Check your weight regularly. Track any changes. If you notice you have gained weight, make changes to your diet or activity routine.  Avoid taking weight-loss medicines or supplements. Talk to your health care provider before starting any new medicine or supplement.  Talk to your health care provider before trying any new diet or exercise plan. Why are these changes important? Eating healthy, staying active, and having healthy habits not only help prevent obesity, they also:    Help you to manage stress and emotions.  Help you to connect with friends and family.  Improve your self-esteem.  Improve your sleep.  Prevent long-term health problems.  What can happen if changes are not made? Being obese or overweight can cause you to develop joint or bone problems, which can make it hard for you to stay active or do activities you enjoy. Being obese or overweight also puts stress on your heart and lungs and can lead to health problems like diabetes, heart disease, and some cancers. Where to find more information: Talk with your health care provider or a dietitian about healthy eating and healthy lifestyle choices. You may also find other information through these resources:  [place]. Department of Agriculture MyPlate: www.choosemyplate.gov  American Heart Association: www.heart.org  Centers for Disease Control and Prevention: www.cdc.gov  Summary  Staying at a healthy weight is important. It helps prevent certain diseases and health problems, such as heart disease, diabetes, joint problems, sleep disorders, and some cancers.  Being obese or overweight can cause you to develop joint or bone problems, which can make it hard for you to stay active or do activities you  enjoy.  You can prevent unhealthy weight gain by eating a healthy diet, exercising regularly, not smoking, limiting alcohol, and getting enough sleep.  Talk with your health care provider or a dietitian for guidance about healthy eating and healthy lifestyle choices. This information is not intended to replace advice given to you by your health care provider. Make sure you discuss any questions you have with your health care provider. Document Released: 07/29/2016 Document Revised: 09/03/2016 Document Reviewed: 09/03/2016 Elsevier Interactive Patient Education  2018 [employer] Inc.  

## 2018-02-08 NOTE — Assessment & Plan Note (Signed)
Much improved with cymbalta; reviewed correct instructions; refill sent; continue to see pain clinic

## 2018-02-08 NOTE — Progress Notes (Signed)
BP 112/70 (BP Location: Right Arm, Patient Position: Sitting, Cuff Size: Large)   Pulse 91   Temp 98 F (36.7 C) (Oral)   Resp 16   Ht 5\' 3"  (1.6 m)   Wt 208 lb 1.6 oz (94.4 kg)   SpO2 96%   BMI 36.86 kg/m    Subjective:    Patient ID: Christina Ware, female    DOB: [birth year], 67 y.o.   MRN: [id number]  HPI: Christina Ware is a 67 y.o. female  Chief Complaint  Patient presents with  . Follow-up    1 month f/u  . Obesity    patient has lose 6lbs. patient stated that she has been trying to eat healthier and was doing good until she went to [place].  . Medication Refill    Duloxetine (could rx be written for 2/day)    HPI  She was started on duloxetine at the last visit; she was started on the 30 mg and went up to 60 mg without any problems; then she got the new Rx for 60 mg strength but did not realize it was 60 mg and she took two a day for about a week, then cut back to just one a day; she has been on one a day for the last few pills; she thinks it is helping with the pain; helping with mood, "so much better"  She is trying to move, had a gigantic yard sale; so much work; cried herself to sleep from working too hard; she was just exhausted; "I was just exhausted before it even started" packing up for weeks  I asked about the 50 mcg versus 25 mcg fentanyl; she is using up the 50 mcg that she is wearing on the left deltoid; she will be switched to the 25 mcg soon; also on gabapentin; he has given her his okay to increase up to three times a day if needed; she never takes three  She is losing weight; lost 12 pounds, but then visited in [place]; they just have "junk" in the house; he won't eat anything but junk; she would have lost more except for that trip; gained weight back when she traveled  Depression screen [facility] 2/9 02/08/2018 12/22/2017 07/08/2017 04/07/2017 07/08/2016  Decreased Interest 0 0 3 0 0  Down, Depressed, Hopeless 1 1 3  0 3  PHQ - 2 Score 1 1 6  0 3    Altered sleeping 0 - 0 - 0  Tired, decreased energy 0 - 0 - 3  Change in appetite 0 - 0 - 3  Feeling bad or failure about yourself  0 - 1 - 0  Trouble concentrating 0 - 0 - 0  Moving slowly or fidgety/restless 0 - 0 - 1  Suicidal thoughts 0 - 1 - 0  PHQ-9 Score 1 - 8 - 10  Difficult doing work/chores Not difficult at all - Somewhat difficult - Somewhat difficult    Relevant past medical, surgical, family and social history reviewed Past Medical History:  Diagnosis Date  . Chronic back pain greater than 3 months duration    Degenerative Disc Disease, surgery on lower back.  . Chronic neck pain    Ruptured disc in neck  . Prediabetes    Past Surgical History:  Procedure Laterality Date  . BACK SURGERY  2005   lower back  . FOOT SURGERY Bilateral    twice  . KNEE ARTHROSCOPY Right    Family History  Problem Relation Age of Onset  .  Diabetes Mother   . Cancer Mother   . Hypertension Mother   . Diabetes Father   . Cancer Father   . Hypertension Father   . Diabetes Sister   . Hypertension Sister   . Diabetes Brother   . Cancer Brother   . Hypertension Brother   . Diabetes Brother   . Birth defects Brother        born with a hole in his heart  . Lung disease Brother        patient needed a lung transplant  . Heart disease Brother        needed a heart transplant   Social History   Tobacco Use  . Smoking status: Former Games developer  . Smokeless tobacco: Never Used  . Tobacco comment: quit when she was 67yrs old  Substance Use Topics  . Alcohol use: No    Alcohol/week: 0.0 oz  . Drug use: No    Interim medical history since last visit reviewed. Allergies and medications reviewed  Review of Systems Per HPI unless specifically indicated above     Objective:    BP 112/70 (BP Location: Right Arm, Patient Position: Sitting, Cuff Size: Large)   Pulse 91   Temp 98 F (36.7 C) (Oral)   Resp 16   Ht 5\' 3"  (1.6 m)   Wt 208 lb 1.6 oz (94.4 kg)   SpO2 96%   BMI  36.86 kg/m   Wt Readings from Last 3 Encounters:  02/08/18 208 lb 1.6 oz (94.4 kg)  12/22/17 213 lb 8 oz (96.8 kg)  12/07/17 213 lb (96.6 kg)    Physical Exam  Constitutional: She appears well-developed and well-nourished. No distress.  HENT:  Head: Normocephalic and atraumatic.  Eyes: EOM are normal. No scleral icterus.  Neck: No thyromegaly present.  Cardiovascular: Normal rate, regular rhythm and normal heart sounds.  No murmur heard. Pulmonary/Chest: Effort normal and breath sounds normal. No respiratory distress. She has no wheezes.  Abdominal: Soft. Bowel sounds are normal. She exhibits no distension.  Musculoskeletal: Normal range of motion. She exhibits no edema.  Neurological: She is alert. She exhibits normal muscle tone.  Skin: Skin is warm and dry. She is not diaphoretic. No pallor.  Psychiatric: She has a normal mood and affect. Her behavior is normal. Judgment and thought content normal.    Results for orders placed or performed in visit on 12/22/17  TSH  Result Value Ref Range   TSH 1.14 0.40 - 4.50 mIU/L  Hemoglobin A1c  Result Value Ref Range   Hgb A1c MFr Bld 6.0 (H) <5.7 % of total Hgb   Mean Plasma Glucose 126 (calc)   eAG (mmol/L) 7.0 (calc)  Lipid panel  Result Value Ref Range   Cholesterol 189 <200 mg/dL   HDL 74 >40 mg/dL   Triglycerides 981 <191 mg/dL   LDL Cholesterol (Calc) 92 mg/dL (calc)   Total CHOL/HDL Ratio 2.6 <5.0 (calc)   Non-HDL Cholesterol (Calc) 115 <130 mg/dL (calc)  COMPLETE METABOLIC PANEL WITH GFR  Result Value Ref Range   Glucose, Bld 113 65 - 139 mg/dL   BUN 17 7 - 25 mg/dL   Creat 4.78 2.95 - 6.21 mg/dL   GFR, Est Non African American 91 > OR = 60 mL/min/1.70m2   GFR, Est African American 105 > OR = 60 mL/min/1.70m2   BUN/Creatinine Ratio NOT APPLICABLE 6 - 22 (calc)   Sodium 139 135 - 146 mmol/L   Potassium 4.2 3.5 - 5.3 mmol/L  Chloride 103 98 - 110 mmol/L   CO2 29 20 - 32 mmol/L   Calcium 9.6 8.6 - 10.4 mg/dL    Total Protein 7.2 6.1 - 8.1 g/dL   Albumin 4.2 3.6 - 5.1 g/dL   Globulin 3.0 1.9 - 3.7 g/dL (calc)   AG Ratio 1.4 1.0 - 2.5 (calc)   Total Bilirubin 0.2 0.2 - 1.2 mg/dL   Alkaline phosphatase (APISO) 65 33 - 130 U/L   AST 18 10 - 35 U/L   ALT 14 6 - 29 U/L      Assessment & Plan:   Problem List Items Addressed This Visit      Musculoskeletal and Integument   Degenerative disc disease, lumbar    Much improved with cymbalta; reviewed correct instructions; refill sent; continue to see pain clinic        Other   Prediabetes    Return in November for labs; as she loses weight, expect this to improve      Obesity (BMI 35.0-39.9 without comorbidity)    Praise given for her weight loss; she'll continue to work on this          Follow up plan: Return in about 5 months (around 06/25/2018) for twenty minute follow-up with fasting labs.  An after-visit summary was printed and given to the patient at check-out.  Please see the patient instructions which may contain other information and recommendations beyond what is mentioned above in the assessment and plan.  Meds ordered this encounter  Medications  . DULoxetine (CYMBALTA) 60 MG capsule    Sig: Take 1 capsule (60 mg total) by mouth daily.    Dispense:  30 capsule    Refill:  5    No orders of the defined types were placed in this encounter.

## 2018-02-08 NOTE — Assessment & Plan Note (Signed)
Return in November for labs; as she loses weight, expect this to improve

## 2018-02-14 ENCOUNTER — Other Ambulatory Visit: Payer: Self-pay | Admitting: Family Medicine

## 2018-03-08 ENCOUNTER — Ambulatory Visit: Payer: Medicare Other

## 2018-05-20 DIAGNOSIS — M545 Low back pain: Secondary | ICD-10-CM | POA: Diagnosis not present

## 2018-05-20 DIAGNOSIS — Z79899 Other long term (current) drug therapy: Secondary | ICD-10-CM | POA: Diagnosis not present

## 2018-05-20 DIAGNOSIS — Z79891 Long term (current) use of opiate analgesic: Secondary | ICD-10-CM | POA: Diagnosis not present

## 2018-05-20 DIAGNOSIS — M961 Postlaminectomy syndrome, not elsewhere classified: Secondary | ICD-10-CM | POA: Diagnosis not present

## 2018-06-25 ENCOUNTER — Ambulatory Visit (INDEPENDENT_AMBULATORY_CARE_PROVIDER_SITE_OTHER): Payer: Medicare Other | Admitting: Family Medicine

## 2018-06-25 ENCOUNTER — Encounter: Payer: Self-pay | Admitting: Family Medicine

## 2018-06-25 VITALS — BP 114/72 | HR 99 | Temp 98.2°F | Ht 63.0 in | Wt 213.5 lb

## 2018-06-25 DIAGNOSIS — E669 Obesity, unspecified: Secondary | ICD-10-CM

## 2018-06-25 DIAGNOSIS — R0981 Nasal congestion: Secondary | ICD-10-CM

## 2018-06-25 DIAGNOSIS — M5136 Other intervertebral disc degeneration, lumbar region: Secondary | ICD-10-CM | POA: Diagnosis not present

## 2018-06-25 DIAGNOSIS — R8290 Unspecified abnormal findings in urine: Secondary | ICD-10-CM

## 2018-06-25 DIAGNOSIS — R7303 Prediabetes: Secondary | ICD-10-CM | POA: Diagnosis not present

## 2018-06-25 DIAGNOSIS — Z5181 Encounter for therapeutic drug level monitoring: Secondary | ICD-10-CM

## 2018-06-25 LAB — POCT URINALYSIS DIPSTICK
BILIRUBIN UA: NEGATIVE
Glucose, UA: NEGATIVE
Ketones, UA: NEGATIVE
LEUKOCYTES UA: NEGATIVE
Nitrite, UA: NEGATIVE
Odor: NORMAL
PH UA: 6 (ref 5.0–8.0)
Protein, UA: NEGATIVE
RBC UA: NEGATIVE
SPEC GRAV UA: 1.015 (ref 1.010–1.025)
UROBILINOGEN UA: 0.2 U/dL

## 2018-06-25 MED ORDER — MOMETASONE FUROATE 50 MCG/ACT NA SUSP: NASAL | 11 refills | Status: DC

## 2018-06-25 NOTE — Assessment & Plan Note (Signed)
Managed by pain clinic 

## 2018-06-25 NOTE — Progress Notes (Signed)
BP 114/72   Pulse 99   Temp 98.2 F (36.8 C) (Oral)   Ht 5\' 3"  (1.6 m)   Wt 213 lb 8 oz (96.8 kg)   SpO2 95%   BMI 37.82 kg/m    Subjective:    Patient ID: Christina Ware, female    DOB: 04/23/1951, 67 y.o.   MRN: [id number]  HPI: Christina Ware is a 67 y.o. female  Chief Complaint  Patient presents with  . Follow-up  . Hot Flashes    HPI  Patient is here for f/u She thinks she may have a bladder infection; urine odor is really strong; trying to drink more water; no burning; just smells really bad; other doctor told her to tell me and have it tested  She is having hot flashes and hair got soaked earlier today, whole scalp; she was so wet she was dripping water on the floor, that was about 10 years ago  Her UDS tested positive for marijuana; she is taking CBD oil; she sees Dr. Ethelene Halamos for chronic back pain, DDD, chronic pain syndrome; she is on 25 mcg pathces of fentanyl  Allergic rhinitis; still bothering her, gets really bad in the fall, Sept and Dec  She read that oil in the belly button helps with dry eyes; they told her that's how babies are fed and how life starts (umbilicus) so it made sense to her  Prediabetes; last A1c was 6 (Dec 22, 2017); horribly dry mouth  Obesity; she was trying to lose weight; started at 213 pounds, and still weighs 213 pounds; she lost 12 pounds at first, "it just flew off" at first when some medicine got out of her system  [facility] 06/25/2018 02/08/2018 12/22/2017 07/08/2017 04/07/2017  Decreased Interest 0 0 0 3 0  Down, Depressed, Hopeless 0 1 1 3  0  PHQ - 2 Score 0 1 1 6  0  Altered sleeping 0 0 - 0 -  Tired, decreased energy 0 0 - 0 -  Change in appetite 0 0 - 0 -  Feeling bad or failure about yourself  0 0 - 1 -  Trouble concentrating 0 0 - 0 -  Moving slowly or fidgety/restless 0 0 - 0 -  Suicidal thoughts 0 0 - 1 -  PHQ-9 Score 0 1 - 8 -  Difficult doing work/chores Not difficult at all Not difficult at  all - Somewhat difficult -   Fall Risk  06/25/2018 02/08/2018 12/22/2017 07/08/2017 04/07/2017  Falls in the past year? 0 No No No No  Number falls in past yr: 0 - - - -    Relevant past medical, surgical, family and social history reviewed Past Medical History:  Diagnosis Date  . Chronic back pain greater than 3 months duration    Degenerative Disc Disease, surgery on lower back.  . Chronic neck pain    Ruptured disc in neck  . Prediabetes   . Spinal stenosis    Past Surgical History:  Procedure Laterality Date  . BACK SURGERY  2005   lower back  . FOOT SURGERY Bilateral    twice  . KNEE ARTHROSCOPY Right    Family History  Problem Relation Age of Onset  . Diabetes Mother   . Cancer Mother   . Hypertension Mother   . Diabetes Father   . Cancer Father   . Hypertension Father   . Diabetes Sister   . Hypertension Sister   . Diabetes Brother   .  Cancer Brother   . Hypertension Brother   . Diabetes Brother   . Birth defects Brother        born with a hole in his heart  . Lung disease Brother        patient needed a lung transplant  . Heart disease Brother        needed a heart transplant   Social History   Tobacco Use  . Smoking status: Former Games developer  . Smokeless tobacco: Never Used  . Tobacco comment: quit when she was 67yrs old  Substance Use Topics  . Alcohol use: No    Alcohol/week: 0.0 standard drinks  . Drug use: No     [facility] Visit from 06/25/2018 in [facility]  AUDIT-C Score  0     Interim medical history since last visit reviewed. Allergies and medications reviewed  Review of Systems Per HPI unless specifically indicated above     Objective:    BP 114/72   Pulse 99   Temp 98.2 F (36.8 C) (Oral)   Ht 5\' 3"  (1.6 m)   Wt 213 lb 8 oz (96.8 kg)   SpO2 95%   BMI 37.82 kg/m   Wt Readings from Last 3 Encounters:  06/25/18 213 lb 8 oz (96.8 kg)  02/08/18 208 lb 1.6 oz (94.4 kg)  12/22/17 213 lb 8 oz (96.8 kg)      Physical Exam  Constitutional: She appears well-developed and well-nourished.  HENT:  Mouth/Throat: Mucous membranes are normal.  Eyes: EOM are normal. No scleral icterus.  Cardiovascular: Normal rate and regular rhythm.  Pulmonary/Chest: Effort normal and breath sounds normal.  Abdominal: She exhibits no distension. There is no tenderness.  Psychiatric: She has a normal mood and affect. Her behavior is normal.    Results for orders placed or performed in visit on 06/25/18  POCT Urinalysis Dipstick  Result Value Ref Range   Color, UA yellow    Clarity, UA clear    Glucose, UA Negative Negative   Bilirubin, UA neg    Ketones, UA neg    Spec Grav, UA 1.015 1.010 - 1.025   Blood, UA neg    pH, UA 6.0 5.0 - 8.0   Protein, UA Negative Negative   Urobilinogen, UA 0.2 0.2 or 1.0 E.U./dL   Nitrite, UA neg    Leukocytes, UA Negative Negative   Appearance yellow    Odor normal       Assessment & Plan:   Problem List Items Addressed This Visit      Musculoskeletal and Integument   Degenerative disc disease, lumbar    Managed by pain clinic      Relevant Medications   fentaNYL (DURAGESIC - DOSED MCG/HR) 25 MCG/HR patch     Other   Chronic nasal congestion   Relevant Medications   mometasone (NASONEX) 50 MCG/ACT nasal spray   Prediabetes - Primary    Check glucose and A1c; weight loss encouraged      Relevant Orders   Hemoglobin A1c   Obesity (BMI 35.0-39.9 without comorbidity)    Encouragement given; drink plenty of water, increase walking some or chair aerobics; offered referral to nutritionist, but patient politely declined; see AVS; try to limit bringing trigger foods in the house       Other Visit Diagnoses    Bad odor of urine       Relevant Orders   POCT Urinalysis Dipstick (Completed)   Urine Culture   Medication monitoring encounter  Relevant Orders   COMPLETE METABOLIC PANEL WITH GFR      Urine did not show any infection, but culture is  pending  Follow up plan: No follow-ups on file.  An after-visit summary was printed and given to the patient at check-out.  Please see the patient instructions which may contain other information and recommendations beyond what is mentioned above in the assessment and plan.  Meds ordered this encounter  Medications  . mometasone (NASONEX) 50 MCG/ACT nasal spray    Sig: Two sprays in each nostril once a day (if needed during allergy season)    Dispense:  16 g    Refill:  11    Orders Placed This Encounter  Procedures  . Urine Culture  . Hemoglobin A1c  . COMPLETE METABOLIC PANEL WITH GFR  . POCT Urinalysis Dipstick

## 2018-06-25 NOTE — Assessment & Plan Note (Signed)
Encouragement given; drink plenty of water, increase walking some or chair aerobics; offered referral to nutritionist, but patient politely declined; see AVS; try to limit bringing trigger foods in the house

## 2018-06-25 NOTE — Patient Instructions (Addendum)
Check out the information at familydoctor.org entitled "Nutrition for Weight Loss: What You Need to Know about Fad Diets" Try to lose between 1-2 pounds per week by taking in fewer calories and burning off more calories You can succeed by limiting portions, limiting foods dense in calories and fat, becoming more active, and drinking 8 glasses of water a day (64 ounces) Don't skip meals, especially breakfast, as skipping meals may alter your metabolism Do not use over-the-counter weight loss pills or gimmicks that claim rapid weight loss A healthy BMI (or body mass index) is between 18.5 and 24.9 You can calculate your ideal BMI at the NIH website [employer]hu  Try to limit saturated fats in your diet (bologna, hot dogs, barbeque, cheeseburgers, hamburgers, steak, bacon, sausage, cheese, etc.) and get more fresh fruits, vegetables, and whole grains  Obesity, Adult Obesity is the condition of having too much total body fat. Being overweight or obese means that your weight is greater than what is considered healthy for your body size. Obesity is determined by a measurement called BMI. BMI is an estimate of body fat and is calculated from height and weight. For adults, a BMI of 30 or higher is considered obese. Obesity can eventually lead to other health concerns and major illnesses, including:  Stroke.  Coronary artery disease (CAD).  Type 2 diabetes.  Some types of cancer, including cancers of the colon, breast, uterus, and gallbladder.  Osteoarthritis.  High blood pressure (hypertension).  High cholesterol.  Sleep apnea.  Gallbladder stones.  Infertility problems.  What are the causes? The main cause of obesity is taking in (consuming) more calories than your body uses for energy. Other factors that contribute to this condition may include:  Being born with genes that make you more likely to become obese.  Having a medical  condition that causes obesity. These conditions include: ? Hypothyroidism. ? Polycystic ovarian syndrome (PCOS). ? Binge-eating disorder. ? Cushing syndrome.  Taking certain medicines, such as steroids, antidepressants, and seizure medicines.  Not being physically active (sedentary lifestyle).  Living where there are limited places to exercise safely or buy healthy foods.  Not getting enough sleep.  What increases the risk? The following factors may increase your risk of this condition:  Having a family history of obesity.  Being a woman of African-American descent.  Being a man of Hispanic descent.  What are the signs or symptoms? Having excessive body fat is the main symptom of this condition. How is this diagnosed? This condition may be diagnosed based on:  Your symptoms.  Your medical history.  A physical exam. Your health care provider may measure: ? Your BMI. If you are an adult with a BMI between 25 and less than 30, you are considered overweight. If you are an adult with a BMI of 30 or higher, you are considered obese. ? The distances around your hips and your waist (circumferences). These may be compared to each other to help diagnose your condition. ? Your skinfold thickness. Your health care provider may gently pinch a fold of your skin and measure it.  How is this treated? Treatment for this condition often includes changing your lifestyle. Treatment may include some or all of the following:  Dietary changes. Work with your health care provider and a dietitian to set a weight-loss goal that is healthy and reasonable for you. Dietary changes may include eating: ? Smaller portions. A portion size is the amount of a particular food that is healthy for you to  eat at one time. This varies from person to person. ? Low-calorie or low-fat options. ? More whole grains, fruits, and vegetables.  Regular physical activity. This may include aerobic activity (cardio) and  strength training.  Medicine to help you lose weight. Your health care provider may prescribe medicine if you are unable to lose 1 pound a week after 6 weeks of eating more healthily and doing more physical activity.  Surgery. Surgical options may include gastric banding and gastric bypass. Surgery may be done if: ? Other treatments have not helped to improve your condition. ? You have a BMI of 40 or higher. ? You have life-threatening health problems related to obesity.  Follow these instructions at home:  Eating and drinking   Follow recommendations from your health care provider about what you eat and drink. Your health care provider may advise you to: ? Limit fast foods, sweets, and processed snack foods. ? Choose low-fat options, such as low-fat milk instead of whole milk. ? Eat 5 or more servings of fruits or vegetables every day. ? Eat at home more often. This gives you more control over what you eat. ? Choose healthy foods when you eat out. ? Learn what a healthy portion size is. ? Keep low-fat snacks on hand. ? Avoid sugary drinks, such as soda, fruit juice, iced tea sweetened with sugar, and flavored milk. ? Eat a healthy breakfast.  Drink enough water to keep your urine clear or pale yellow.  Do not go without eating for long periods of time (do not fast) or follow a fad diet. Fasting and fad diets can be unhealthy and even dangerous. Physical Activity  Exercise regularly, as told by your health care provider. Ask your health care provider what types of exercise are safe for you and how often you should exercise.  Warm up and stretch before being active.  Cool down and stretch after being active.  Rest between periods of activity. Lifestyle  Limit the time that you spend in front of your TV, computer, or video game system.  Find ways to reward yourself that do not involve food.  Limit alcohol intake to no more than 1 drink a day for nonpregnant women and 2 drinks  a day for men. One drink equals 12 oz of beer, 5 oz of wine, or 1 oz of hard liquor. General instructions  Keep a weight loss journal to keep track of the food you eat and how much you exercise you get.  Take over-the-counter and prescription medicines only as told by your health care provider.  Take vitamins and supplements only as told by your health care provider.  Consider joining a support group. Your health care provider may be able to recommend a support group.  Keep all follow-up visits as told by your health care provider. This is important. Contact a health care provider if:  You are unable to meet your weight loss goal after 6 weeks of dietary and lifestyle changes. This information is not intended to replace advice given to you by your health care provider. Make sure you discuss any questions you have with your health care provider. Document Released: 09/04/2004 Document Revised: 12/31/2015 Document Reviewed: 05/16/2015 Elsevier Interactive Patient Education  2018 Elsevier Inc.  Preventing Unhealthy [employer], Adult Staying at a healthy weight is important. When fat builds up in your body, you may become overweight or obese. These conditions put you at greater risk for developing certain health problems, such as heart disease, diabetes,  sleeping problems, joint problems, and some cancers. Unhealthy weight gain is often the result of making unhealthy choices in what you eat. It is also a result of not getting enough exercise. You can make changes to your lifestyle to prevent obesity and stay as healthy as possible. What nutrition changes can be made? To maintain a healthy weight and prevent obesity:  Eat only as much as your body needs. To do this: ? Pay attention to signs that you are hungry or full. Stop eating as soon as you feel full. ? If you feel hungry, try drinking water first. Drink enough water so your urine is clear or pale yellow. ? Eat smaller portions. ? Look  at serving sizes on food labels. Most foods contain more than one serving per container. ? Eat the recommended amount of calories for your gender and activity level. While most active people should eat around 2,000 calories per day, if you are trying to lose weight or are not very active, you main need to eat less calories. Talk to your health care provider or dietitian about how many calories you should eat each day.  Choose healthy foods, such as: ? Fruits and vegetables. Try to fill at least half of your plate at each meal with fruits and vegetables. ? Whole grains, such as whole wheat bread, brown rice, and quinoa. ? Lean meats, such as chicken or fish. ? Other healthy proteins, such as beans, eggs, or tofu. ? Healthy fats, such as nuts, seeds, fatty fish, and olive oil. ? Low-fat or fat-free dairy.  Check food labels and avoid food and drinks that: ? Are high in calories. ? Have added sugar. ? Are high in sodium. ? Have saturated fats or trans fats.  Limit how much you eat of the following foods: ? Prepackaged meals. ? Fast food. ? Fried foods. ? Processed meat, such as bacon, sausage, and deli meats. ? Fatty cuts of red meat and poultry with skin.  Cook foods in healthier ways, such as by baking, broiling, or grilling.  When grocery shopping, try to shop around the outside of the store. This helps you buy mostly fresh foods and avoid canned and prepackaged foods.  What lifestyle changes can be made?  Exercise at least 30 minutes 5 or more days each week. Exercising includes brisk walking, yard work, biking, running, swimming, and team sports like basketball and soccer. Ask your health care provider which exercises are safe for you.  Do not use any products that contain nicotine or tobacco, such as cigarettes and e-cigarettes. If you need help quitting, ask your health care provider.  Limit alcohol intake to no more than 1 drink a day for nonpregnant women and 2 drinks a day  for men. One drink equals 12 oz of beer, 5 oz of wine, or 1 oz of hard liquor.  Try to get 7-9 hours of sleep each night. What other changes can be made?  Keep a food and activity journal to keep track of: ? What you ate and how many calories you had. Remember to count sauces, dressings, and side dishes. ? Whether you were active, and what exercises you did. ? Your calorie, weight, and activity goals.  Check your weight regularly. Track any changes. If you notice you have gained weight, make changes to your diet or activity routine.  Avoid taking weight-loss medicines or supplements. Talk to your health care provider before starting any new medicine or supplement.  Talk to your health care  provider before trying any new diet or exercise plan. Why are these changes important? Eating healthy, staying active, and having healthy habits not only help prevent obesity, they also:  Help you to manage stress and emotions.  Help you to connect with friends and family.  Improve your self-esteem.  Improve your sleep.  Prevent long-term health problems.  What can happen if changes are not made? Being obese or overweight can cause you to develop joint or bone problems, which can make it hard for you to stay active or do activities you enjoy. Being obese or overweight also puts stress on your heart and lungs and can lead to health problems like diabetes, heart disease, and some cancers. Where to find more information: Talk with your health care provider or a dietitian about healthy eating and healthy lifestyle choices. You may also find other information through these resources:  U.S. Department of Agriculture MyPlate: https://ball-collins.biz/  American Heart Association: www.heart.org  Centers for Disease Control and Prevention: FootballExhibition.com.br  Summary  Staying at a healthy weight is important. It helps prevent certain diseases and health problems, such as heart disease, diabetes, joint  problems, sleep disorders, and some cancers.  Being obese or overweight can cause you to develop joint or bone problems, which can make it hard for you to stay active or do activities you enjoy.  You can prevent unhealthy weight gain by eating a healthy diet, exercising regularly, not smoking, limiting alcohol, and getting enough sleep.  Talk with your health care provider or a dietitian for guidance about healthy eating and healthy lifestyle choices. This information is not intended to replace advice given to you by your health care provider. Make sure you discuss any questions you have with your health care provider. Document Released: 07/29/2016 Document Revised: 09/03/2016 Document Reviewed: 09/03/2016 Elsevier Interactive Patient Education  [employer].

## 2018-06-25 NOTE — Assessment & Plan Note (Signed)
Check glucose and A1c; weight loss encouraged 

## 2018-06-26 LAB — COMPLETE METABOLIC PANEL WITH GFR
AG Ratio: 1.3 (calc) (ref 1.0–2.5)
ALKALINE PHOSPHATASE (APISO): 68 U/L (ref 33–130)
ALT: 13 U/L (ref 6–29)
AST: 21 U/L (ref 10–35)
Albumin: 4.2 g/dL (ref 3.6–5.1)
BUN: 14 mg/dL (ref 7–25)
CO2: 27 mmol/L (ref 20–32)
CREATININE: 0.72 mg/dL (ref 0.50–0.99)
Calcium: 9.5 mg/dL (ref 8.6–10.4)
Chloride: 102 mmol/L (ref 98–110)
GFR, Est African American: 101 mL/min/{1.73_m2} (ref >=60)
GFR, Est Non African American: 87 mL/min/{1.73_m2} (ref >=60)
GLUCOSE: 88 mg/dL (ref 65–139)
Globulin: 3.2 g/dL (calc) (ref 1.9–3.7)
Potassium: 4.3 mmol/L (ref 3.5–5.3)
Sodium: 137 mmol/L (ref 135–146)
Total Bilirubin: 0.5 mg/dL (ref 0.2–1.2)
Total Protein: 7.4 g/dL (ref 6.1–8.1)

## 2018-06-26 LAB — HEMOGLOBIN A1C
Hgb A1c MFr Bld: 5.9 % of total Hgb — ABNORMAL HIGH (ref <5.7)
Mean Plasma Glucose: 123 (calc)
eAG (mmol/L): 6.8 (calc)

## 2018-06-26 LAB — URINE CULTURE
MICRO NUMBER:: 91379773
SPECIMEN QUALITY:: ADEQUATE

## 2018-07-02 ENCOUNTER — Other Ambulatory Visit: Payer: Self-pay | Admitting: Family Medicine

## 2018-07-28 ENCOUNTER — Other Ambulatory Visit: Payer: Self-pay | Admitting: Family Medicine

## 2018-08-03 ENCOUNTER — Other Ambulatory Visit: Payer: Self-pay | Admitting: Family Medicine

## 2018-08-03 ENCOUNTER — Telehealth: Payer: Self-pay | Admitting: Family Medicine

## 2018-08-03 MED ORDER — DULOXETINE HCL 30 MG PO CPEP: 30.0000 mg | ORAL_CAPSULE | Freq: Every day | ORAL | 0 refills | Status: DC

## 2018-08-03 NOTE — Telephone Encounter (Signed)
Let patient know that since she is on fentanyl, we'll decrease her cymbalta from 60 mg to 30 mg to lessen risk of serotonin syndrome; if worsening of symptoms, let us know 

## 2018-08-03 NOTE — Telephone Encounter (Signed)
Let patient know that since she is on fentanyl, we'll decrease her cymbalta from 60 mg to 30 mg to lessen risk of serotonin syndrome; if worsening of symptoms, let us know

## 2018-08-03 NOTE — Telephone Encounter (Signed)
Left detailed VM. CRM created.  

## 2018-08-30 ENCOUNTER — Other Ambulatory Visit: Payer: Self-pay | Admitting: Family Medicine

## 2018-09-15 ENCOUNTER — Telehealth: Payer: Self-pay | Admitting: Family Medicine

## 2018-09-15 NOTE — Telephone Encounter (Signed)
I called the patient to schedule AWV-S with [facility], but she declined. VDM (DD)

## 2018-09-23 DIAGNOSIS — M48061 Spinal stenosis, lumbar region without neurogenic claudication: Secondary | ICD-10-CM | POA: Diagnosis not present

## 2018-11-17 ENCOUNTER — Encounter: Payer: Self-pay | Admitting: Nurse Practitioner

## 2018-11-17 ENCOUNTER — Telehealth: Payer: Self-pay

## 2018-11-17 ENCOUNTER — Other Ambulatory Visit: Payer: Self-pay

## 2018-11-17 ENCOUNTER — Ambulatory Visit (INDEPENDENT_AMBULATORY_CARE_PROVIDER_SITE_OTHER): Payer: Medicare Other | Admitting: Nurse Practitioner

## 2018-11-17 VITALS — BP 124/86 | HR 97

## 2018-11-17 DIAGNOSIS — F4321 Adjustment disorder with depressed mood: Secondary | ICD-10-CM

## 2018-11-17 DIAGNOSIS — R112 Nausea with vomiting, unspecified: Secondary | ICD-10-CM | POA: Diagnosis not present

## 2018-11-17 MED ORDER — PROMETHAZINE HCL 12.5 MG PO TABS: 12.5000 mg | ORAL_TABLET | Freq: Three times a day (TID) | ORAL | 0 refills | Status: DC | PRN

## 2018-11-17 MED ORDER — DULOXETINE HCL 30 MG PO CPEP: 30.0000 mg | ORAL_CAPSULE | Freq: Every day | ORAL | 0 refills | Status: DC

## 2018-11-17 NOTE — Progress Notes (Signed)
Virtual Visit via Video Note  I connected with Christina Ware on 11/17/18 at  1:40 PM EDT by a video enabled telemedicine application and verified that I am speaking with the correct person using two identifiers.   Staff discussed the limitations of evaluation and management by telemedicine and the availability of in person appointments. The patient expressed understanding and agreed to proceed.  Patient location: home  My location: Home office Other people present:  Niece- Drue Flirt HPI  Patient states Friday morning got up and was making breakfast and then got nauseated and started throwing up, states improved and then it returned Saturday and has continued since then. States this is the fifth time she has had something like this and has been diagnosed with gastroenteritis. Endorses nausea, vomiting and diarrhea. States diarrhea has resolved. States she has thrown up too many times to count in the last 24/7. States feels weak and was having a hard time standing up this past weekend. She has more strength now and is able to get to the bathroom wthout assistance from her niece. States last BM was today.  She has been taking liquid antacid with some relief.    Initially reported dark stool like emesis by niece, patient denied after recommending ER. She states it was initially dark digested food but is now yellow- clear. Just keeping liquids down no solids. Patient notes mild-moderate diffuse abdominal pain, worse in bilateral lower quadrants. Denies fevers, chills, dysuria.   PHQ2/9: Depression screen [facility] 2/9 11/17/2018 06/25/2018 02/08/2018 12/22/2017 07/08/2017  Decreased Interest 0 0 0 0 3  Down, Depressed, Hopeless 0 0 PHQ - 2 Score 0 0 Altered sleeping 0 0 0 - 0  Tired, decreased energy 0 0 0 - 0  Change in appetite 0 0 0 - 0  Feeling bad or failure about yourself  0 0 0 - 1  Trouble concentrating 0 0 0 - 0  Moving slowly or fidgety/restless 0 0 0 - 0  Suicidal thoughts 0 0 0 -  1  PHQ-9 Score 0 0 1 - 8  Difficult doing work/chores Not difficult at all Not difficult at all Not difficult at all - Somewhat difficult   Negative screening   Patient Active Problem List   Diagnosis Date Noted  . Degenerative disc disease, lumbar 02/08/2018  . BMI 37.0-37.9, adult 12/22/2017  . Chronic nasal congestion 04/07/2017  . Warts 07/08/2016  . Obesity (BMI 35.0-39.9 without comorbidity) 07/08/2016  . Well woman exam with routine gynecological exam 06/12/2016  . Bright red rectal bleeding 12/14/2015  . Prediabetes 07/26/2015    Past Medical History:  Diagnosis Date  . Chronic back pain greater than 3 months duration    Degenerative Disc Disease, surgery on lower back.  . Chronic neck pain    Ruptured disc in neck  . Prediabetes   . Spinal stenosis     Past Surgical History:  Procedure Laterality Date  . BACK SURGERY  2005   lower back  . FOOT SURGERY Bilateral    twice  . KNEE ARTHROSCOPY Right     Social History   Tobacco Use  . Smoking status: Former Games developer  . Smokeless tobacco: Never Used  . Tobacco comment: quit when she was 68yrs old  Substance Use Topics  . Alcohol use: No    Alcohol/week: 0.0 standard drinks     Current Outpatient Medications:  .  Alpha-Lipoic Acid 200 MG TABS, Take 1 tablet by mouth  daily. , Disp: , Rfl:  .  Black Cohosh ([facility] BLACK COHOSH) 540 MG CAPS, Take 1 capsule by mouth daily. , Disp: , Rfl:  .  docusate sodium (COLACE) 100 MG capsule, Take 200 mg by mouth daily as needed. , Disp: , Rfl:  .  DULoxetine (CYMBALTA) 30 MG capsule, TAKE 1 CAPSULE BY MOUTH ONCE DAILY (NEW LOWER DOSE), Disp: 30 capsule, Rfl: 2 .  fentaNYL (DURAGESIC - DOSED MCG/HR) 25 MCG/HR patch, Place 1 patch onto the skin every 3 (three) days., Disp: , Rfl:  .  gabapentin (NEURONTIN) 800 MG tablet, Take 800 mg by mouth 2 (two) times daily as needed. , Disp: , Rfl:  .  L-Arginine 500 MG CAPS, Take 1 capsule by mouth daily. , Disp: , Rfl:  .  mometasone  (NASONEX) 50 MCG/ACT nasal spray, Two sprays in each nostril once a day (if needed during allergy season), Disp: 16 g, Rfl: 11 .  Omega-3 Fatty Acids (FISH OIL) 1000 MG CAPS, Take 1 capsule by mouth daily. , Disp: , Rfl:  .  Oxycodone HCl 10 MG TABS, oxycodone 10 mg tablet  Take 1 tablet 4 times a day by oral route as needed., Disp: , Rfl:  .  tiZANidine (ZANAFLEX) 4 MG tablet, Take 4 mg by mouth every 8 (eight) hours as needed. Reported on 02/25/2016, Disp: , Rfl:  .  vitamin E 400 UNIT capsule, Take 400 Units by mouth daily. Reported on 02/25/2016, Disp: , Rfl:  .  Acetylcarnitine HCl (ACETYL L-CARNITINE PO), Take 400 mg by mouth., Disp: , Rfl:  .  triamcinolone ointment (KENALOG) 0.5 %, Apply 1 application topically 2 (two) times daily. If needed; too strong for face, underams, groin (Patient not taking: Reported on 11/17/2018), Disp: 30 g, Rfl: 0  Allergies  Allergen Reactions  . Codeine Other (See Comments)  . Hydrocodone   . Latex     ROS   No other specific complaints in a complete review of systems (except as listed in HPI above).  Objective  Vitals:   11/17/18 1349  BP: 124/86  Pulse: 97    There is no height or weight on file to calculate BMI.  Nursing Note and Vital Signs reviewed.  Physical Exam  Constitutional: Patient laying in bed, appears uncomfortable had an episode of emesis during video chat. HENT: Head: Normocephalic and atraumatic. Cardiovascular: Normal rate Pulmonary/Chest: Effort normal  Abdominal: no rash or obvious distention or tenderness throughout  Neurological: he is alert and oriented to person, place, and time. speech and gait are normal.  Skin: No rash noted. No erythema.  Psychiatric: Patient has a normal mood and affect. behavior is normal. Judgment and thought content normal.    Assessment & Plan  1. Intractable vomiting with nausea, unspecified vomiting type Recommended in-person evaluation, patient declines due to concern of pandemic  and then stated that she felt her symptoms were improving. Discussed risks and benefits in detail- most likely gastroenteritits, however concern for other etiology. If symptoms do not continue to improve will come in tomorrow for labs and re-evaluation. If worsening will go to ER.    - promethazine (PHENERGAN) 12.5 MG tablet; Take 1 tablet (12.5 mg total) by mouth every 8 (eight) hours as needed for nausea or vomiting.  Dispense: 30 tablet; Refill: 0 - CBC with Differential; Future - COMPLETE METABOLIC PANEL WITH GFR; Future  2. Situational depression Requested refill - DULoxetine (CYMBALTA) 30 MG capsule; Take 1 capsule (30 mg total) by mouth daily.  Dispense: 90 capsule; Refill: 0    Follow Up Instructions:  Come in tomorrow for in person assessment and labs if not improved, OR virtual follow up in 2 days    I discussed the assessment and treatment plan with the patient. The patient was provided an opportunity to ask questions and all were answered. The patient agreed with the plan and demonstrated an understanding of the instructions.   The patient was advised to call back or seek an in-person evaluation if the symptoms worsen or if the condition fails to improve as anticipated.  I provided 25 minutes of non-face-to-face time during this encounter.   Cheryle HorsfallElizabeth E Shontell Prosser, NP

## 2018-11-17 NOTE — Telephone Encounter (Signed)
This sounds like a telehealth visit is in order; I am booked Please schedule with Lanora Manis if she has availability

## 2018-11-17 NOTE — Telephone Encounter (Signed)
Scheduled with Lanora Manis 11/17/18 at 11:20am.  Copied from CRM [id number]. Topic: General - Other >> Nov 17, 2018  9:04 AM Christina Ware wrote: Pt niece Candi call to say pt is having acid issues ,stomach burning, diarrh, nausea   and is asking if something can be called in , niece is not on DPR but said if we cal lthe below number she will be with pt   [phone]

## 2018-11-19 ENCOUNTER — Other Ambulatory Visit: Payer: Self-pay

## 2018-11-19 ENCOUNTER — Ambulatory Visit (INDEPENDENT_AMBULATORY_CARE_PROVIDER_SITE_OTHER): Payer: Medicare Other | Admitting: Nurse Practitioner

## 2018-11-19 ENCOUNTER — Encounter: Payer: Self-pay | Admitting: Nurse Practitioner

## 2018-11-19 VITALS — HR 89 | Resp 18

## 2018-11-19 DIAGNOSIS — M5136 Other intervertebral disc degeneration, lumbar region: Secondary | ICD-10-CM

## 2018-11-19 DIAGNOSIS — K529 Noninfective gastroenteritis and colitis, unspecified: Secondary | ICD-10-CM

## 2018-11-19 NOTE — Progress Notes (Signed)
Virtual Visit via Video Note  I connected with Christina Ware on 11/19/18 at 10:00 AM EDT by a video enabled telemedicine application and verified that I am speaking with the correct person using two identifiers.   Staff discussed the limitations of evaluation and management by telemedicine and the availability of in person appointments. The patient expressed understanding and agreed to proceed.  Patient location: home  My location: Home office Other people present:  none HPI  States she is feeling much better, able to walk around and has more strength. Phenergan resolved nausea. States her last episode of emesis was Wednesday evening. States has been drinking lots of water a little bit of pedialyte and able to eat solids this morning without issue. States still has some fatigue but much improved since 2 days ago.    Denies shortness of breath, fevers, chills,   States her nausea, vomiting, and diarrhea have completely resolved  [facility] 11/19/2018 11/17/2018 06/25/2018 02/08/2018 12/22/2017  Decreased Interest 0 0 0 0 0  Down, Depressed, Hopeless 0 0 0 1 1  PHQ - 2 Score 0 0 0 1 1  Altered sleeping 0 0 0 0 -  Tired, decreased energy 0 0 0 0 -  Change in appetite 0 0 0 0 -  Feeling bad or failure about yourself  0 0 0 0 -  Trouble concentrating 0 0 0 0 -  Moving slowly or fidgety/restless 0 0 0 0 -  Suicidal thoughts 0 0 0 0 -  PHQ-9 Score 0 0 0 1 -  Difficult doing work/chores - Not difficult at all Not difficult at all Not difficult at all -    PHQ reviewed. Negative  Patient Active Problem List   Diagnosis Date Noted  . Degenerative disc disease, lumbar 02/08/2018  . BMI 37.0-37.9, adult 12/22/2017  . Chronic nasal congestion 04/07/2017  . Warts 07/08/2016  . Obesity (BMI 35.0-39.9 without comorbidity) 07/08/2016  . Well woman exam with routine gynecological exam 06/12/2016  . Bright red rectal bleeding 12/14/2015  . Prediabetes 07/26/2015     Past Medical History:  Diagnosis Date  . Chronic back pain greater than 3 months duration    Degenerative Disc Disease, surgery on lower back.  . Chronic neck pain    Ruptured disc in neck  . Prediabetes   . Spinal stenosis     Past Surgical History:  Procedure Laterality Date  . BACK SURGERY  2005   lower back  . FOOT SURGERY Bilateral    twice  . KNEE ARTHROSCOPY Right     Social History   Tobacco Use  . Smoking status: Former Games developermoker  . Smokeless tobacco: Never Used  . Tobacco comment: quit when she was [age]yrs old  Substance Use Topics  . Alcohol use: No    Alcohol/week: 0.0 standard drinks     Current Outpatient Medications:  .  Acetylcarnitine HCl (ACETYL L-CARNITINE PO), Take 400 mg by mouth., Disp: , Rfl:  .  Alpha-Lipoic Acid 200 MG TABS, Take 1 tablet by mouth daily. , Disp: , Rfl:  .  Black Cohosh ([employer] BLACK COHOSH) 540 MG CAPS, Take 1 capsule by mouth daily. , Disp: , Rfl:  .  docusate sodium (COLACE) 100 MG capsule, Take 200 mg by mouth daily as needed. , Disp: , Rfl:  .  DULoxetine (CYMBALTA) 30 MG capsule, Take 1 capsule (30 mg total) by mouth daily., Disp: 90 capsule, Rfl: 0 .  fentaNYL (DURAGESIC - DOSED  MCG/HR) 25 MCG/HR patch, Place 1 patch onto the skin every 3 (three) days., Disp: , Rfl:  .  gabapentin (NEURONTIN) 800 MG tablet, Take 800 mg by mouth 2 (two) times daily as needed. , Disp: , Rfl:  .  L-Arginine 500 MG CAPS, Take 1 capsule by mouth daily. , Disp: , Rfl:  .  mometasone (NASONEX) 50 MCG/ACT nasal spray, Two sprays in each nostril once a day (if needed during allergy season), Disp: 16 g, Rfl: 11 .  Omega-3 Fatty Acids (FISH OIL) 1000 MG CAPS, Take 1 capsule by mouth daily. , Disp: , Rfl:  .  Oxycodone HCl 10 MG TABS, oxycodone 10 mg tablet  Take 1 tablet 4 times a day by oral route as needed., Disp: , Rfl:  .  promethazine (PHENERGAN) 12.5 MG tablet, Take 1 tablet (12.5 mg total) by mouth every 8 (eight) hours as needed for nausea or  vomiting., Disp: 30 tablet, Rfl: 0 .  tiZANidine (ZANAFLEX) 4 MG tablet, Take 4 mg by mouth every 8 (eight) hours as needed. Reported on 02/25/2016, Disp: , Rfl:  .  triamcinolone ointment (KENALOG) 0.5 %, Apply 1 application topically 2 (two) times daily. If needed; too strong for face, underams, groin, Disp: 30 g, Rfl: 0 .  vitamin E 400 UNIT capsule, Take 400 Units by mouth daily. Reported on 02/25/2016, Disp: , Rfl:   Allergies  Allergen Reactions  . Codeine Other (See Comments)  . Hydrocodone   . Latex     ROS   No other specific complaints in a complete review of systems (except as listed in HPI above).  Objective  Vitals:   11/19/18 0957  Pulse: 89  Resp: 18  SpO2: 97%       Physical Exam  Constitutional: Patient appears well-developed and well-nourished. No distress.  HENT: Head: Normocephalic and atraumatic. Cardiovascular: Normal rate Pulmonary/Chest: Effort normal  Musculoskeletal: Normal range of motion,  Neurological: he is alert and oriented to person, place, and time. speech and gait are normal.  Skin: No rash noted. No erythema.  Psychiatric: Patient has a normal mood and affect. behavior is normal. Judgment and thought content normal.    Assessment & Plan  1. Gastroenteritis Resolved, discussed importance of hydration and diet and slowly increasing activity. Fatigue should be improving daily   2. Degenerative disc disease, lumbar Take pain medications as needed, sparingly and as prescribed only. Discussed and demonstrated stretches for lumbar spinal stenosis and discussed PT will let us know after pandemic      Follow Up Instructions:  Follow-up as needed   I discussed the assessment and treatment plan with the patient. The patient was provided an opportunity to ask questions and all were answered. The patient agreed with the plan and demonstrated an understanding of the instructions.   The patient was advised to call back or seek an in-person  evaluation if the symptoms worsen or if the condition fails to improve as anticipated.  I provided 21 minutes of non-face-to-face time during this encounter.   Cheryle Horsfall, NP

## 2018-12-24 ENCOUNTER — Ambulatory Visit: Payer: Medicare Other | Admitting: Family Medicine

## 2019-01-11 DIAGNOSIS — M542 Cervicalgia: Secondary | ICD-10-CM | POA: Diagnosis not present

## 2019-01-27 IMAGING — MG MM DIGITAL SCREENING BILAT W/ TOMO W/ CAD
8 of 13 series · 8 of 29 positions shown · non-contrast
Comparison: Previous exam(s).

CLINICAL DATA: Screening.

EXAM:
2D DIGITAL SCREENING BILATERAL MAMMOGRAM WITH CAD AND ADJUNCT TOMO

[L MLO (1 of 2)]
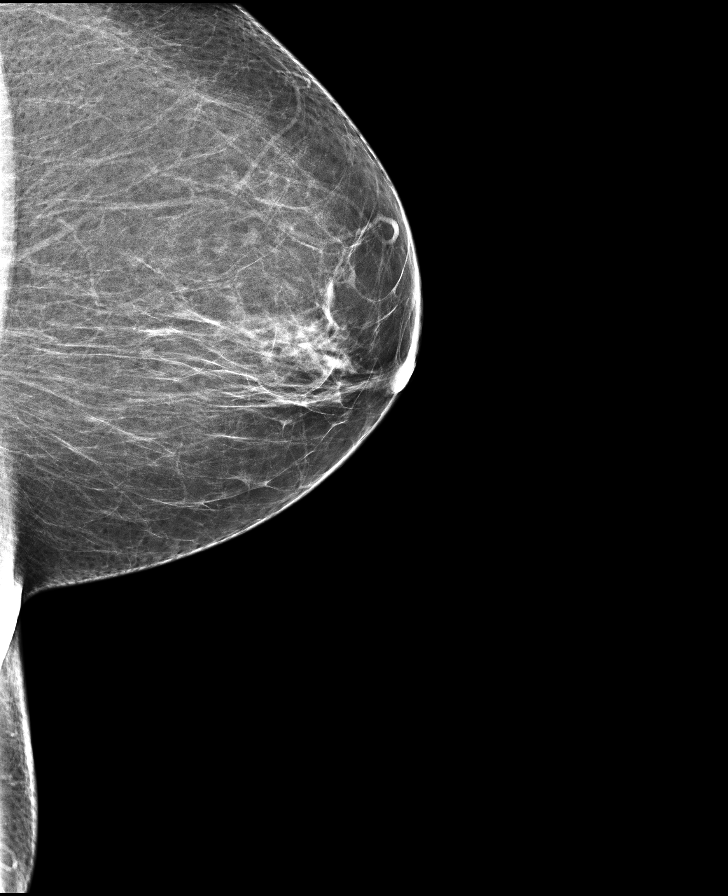

[R CC]
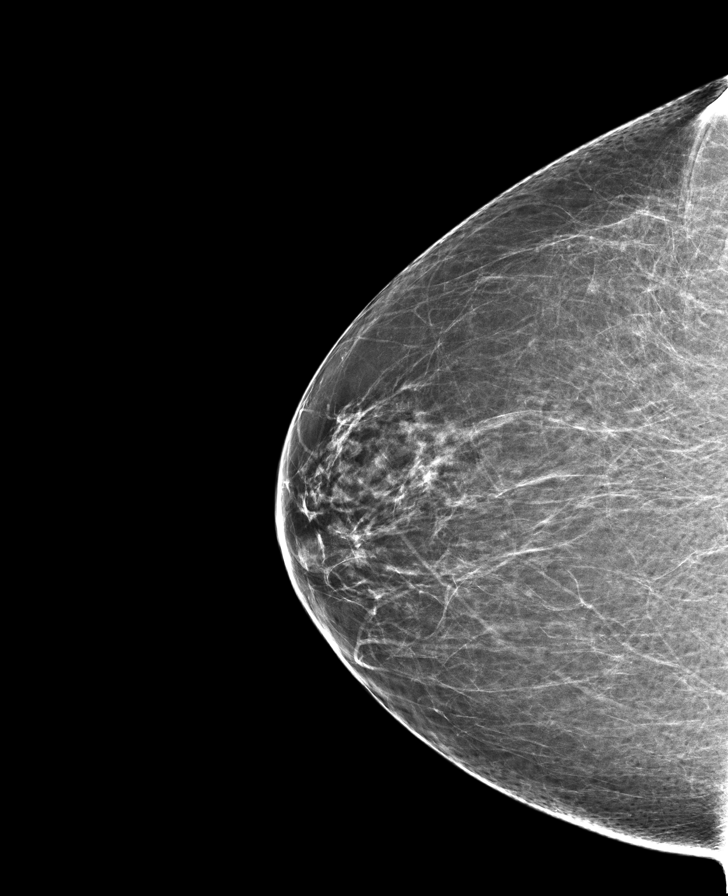

[L CC synth-2D]
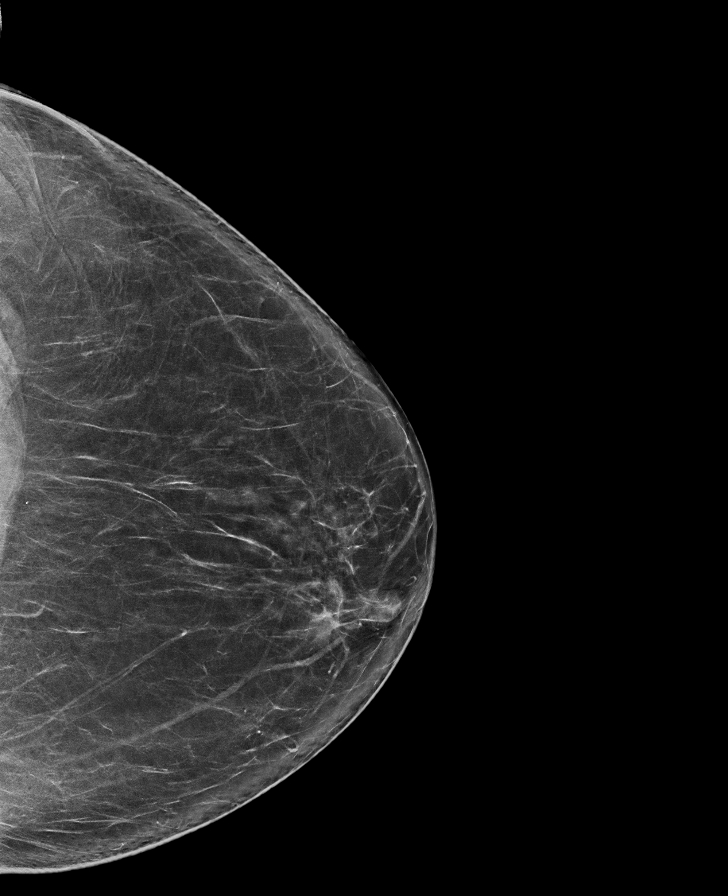

[R MLO]
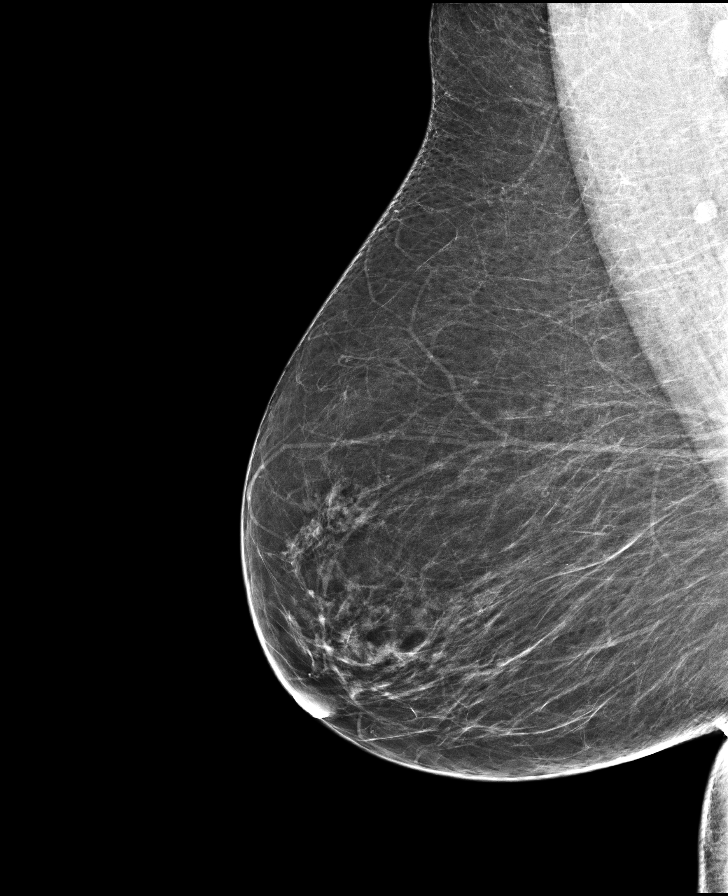

[L MLO (2 of 2)]
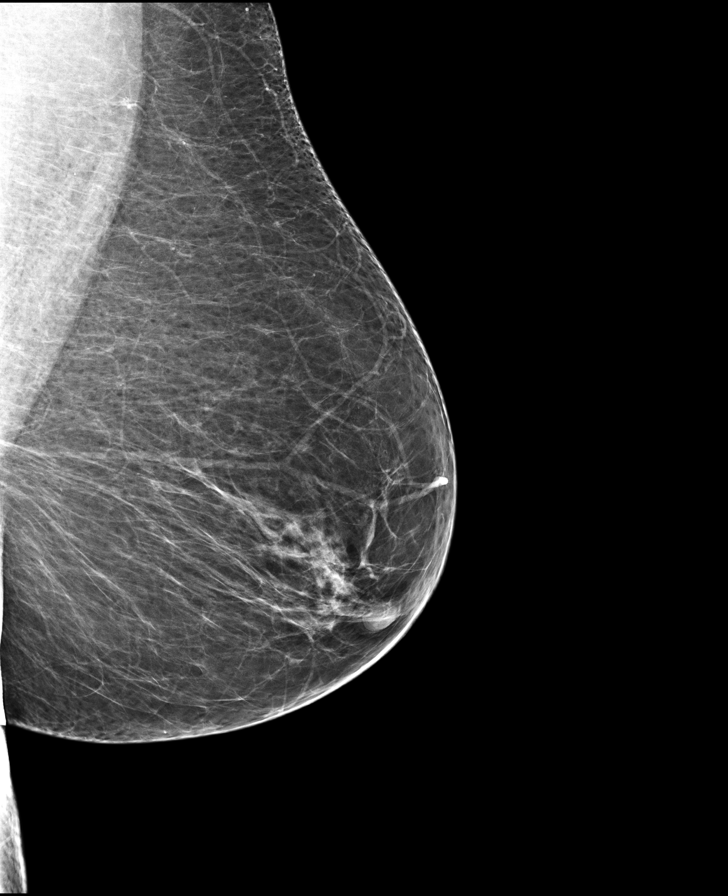

[L MLO synth-2D]
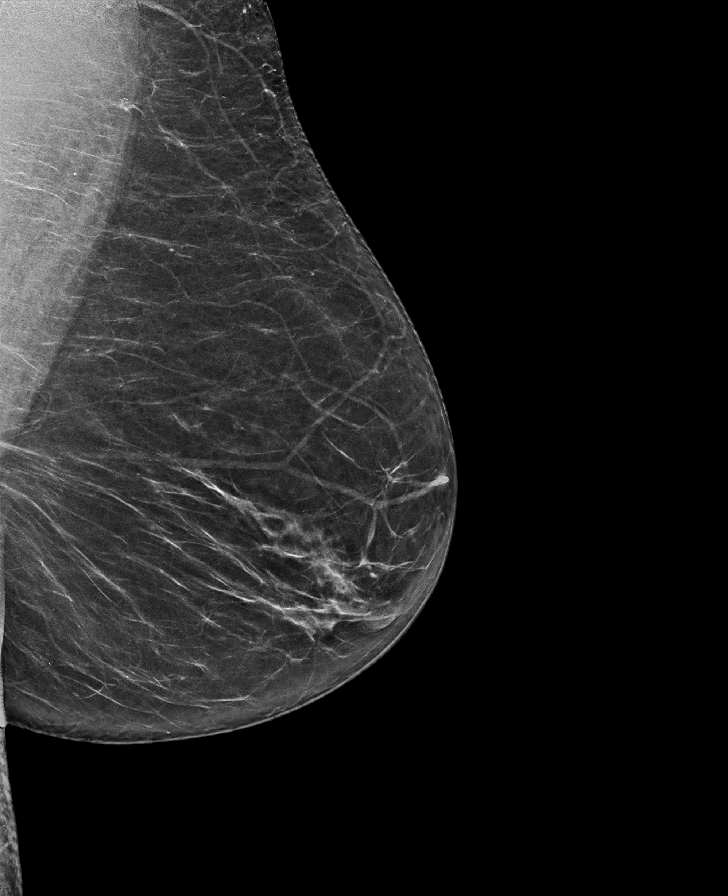

[R MLO synth-2D]
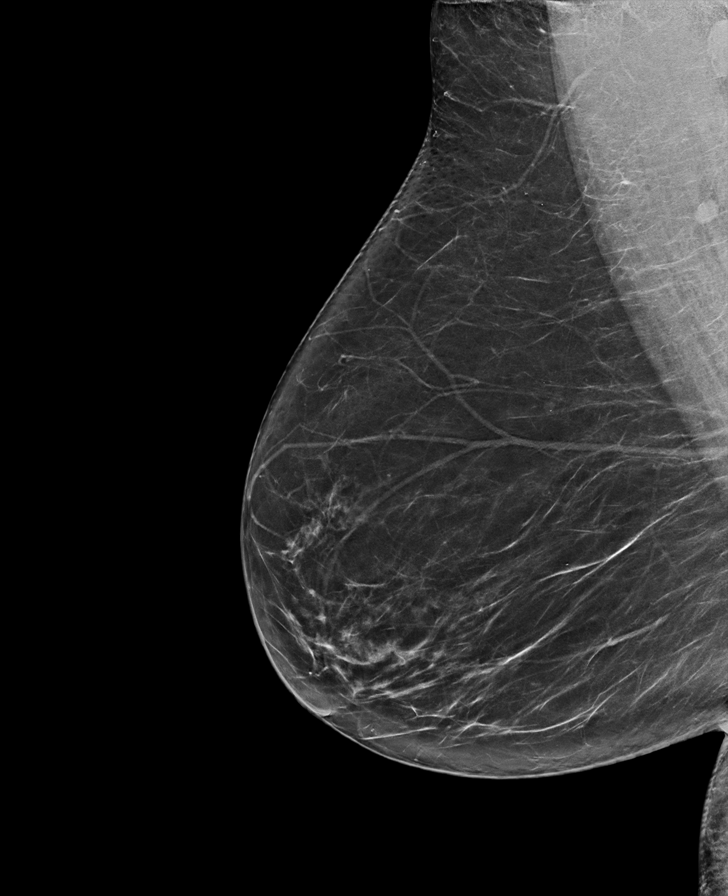

[R CC synth-2D]
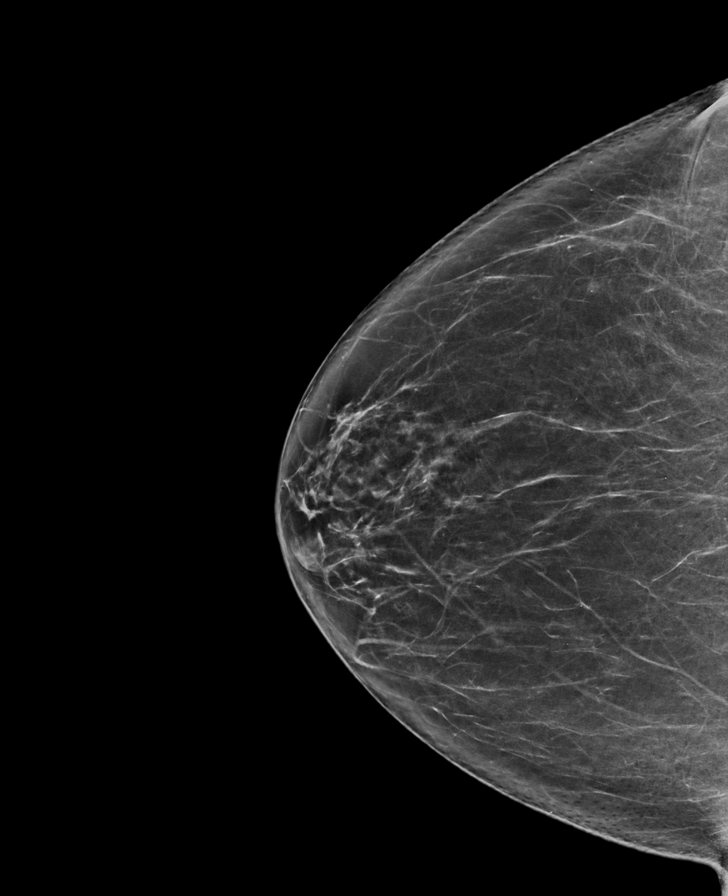

[8 of 29 positions shown; findings below may reference images not displayed]

ACR Breast Density Category b: There are scattered areas of
fibroglandular density.
FINDINGS: There are no findings suspicious for malignancy. Images were
processed with CAD.
IMPRESSION: No mammographic evidence of malignancy. A result letter of this
screening mammogram will be mailed directly to the patient.

RECOMMENDATION:
Screening mammogram in one year. (Code:97-6-RS4)

BI-RADS CATEGORY  1: Negative.

## 2019-02-15 ENCOUNTER — Other Ambulatory Visit: Payer: Self-pay | Admitting: Nurse Practitioner

## 2019-02-15 DIAGNOSIS — F4321 Adjustment disorder with depressed mood: Secondary | ICD-10-CM

## 2019-02-15 NOTE — Telephone Encounter (Signed)
Please have patient schedule routine follow-up within next 3 months can be virtual

## 2019-02-16 NOTE — Telephone Encounter (Signed)
Pt informed script has been sent in and she did schedule a virtual appt with Raquel Sarna

## 2019-04-21 DIAGNOSIS — H25013 Cortical age-related cataract, bilateral: Secondary | ICD-10-CM | POA: Diagnosis not present

## 2019-04-21 DIAGNOSIS — H43813 Vitreous degeneration, bilateral: Secondary | ICD-10-CM | POA: Diagnosis not present

## 2019-04-21 DIAGNOSIS — H2513 Age-related nuclear cataract, bilateral: Secondary | ICD-10-CM | POA: Diagnosis not present

## 2019-04-21 DIAGNOSIS — H524 Presbyopia: Secondary | ICD-10-CM | POA: Diagnosis not present

## 2019-05-09 DIAGNOSIS — M542 Cervicalgia: Secondary | ICD-10-CM | POA: Diagnosis not present

## 2019-05-19 ENCOUNTER — Encounter: Payer: Self-pay | Admitting: Family Medicine

## 2019-05-19 ENCOUNTER — Other Ambulatory Visit: Payer: Self-pay

## 2019-05-19 ENCOUNTER — Ambulatory Visit (INDEPENDENT_AMBULATORY_CARE_PROVIDER_SITE_OTHER): Payer: Medicare Other | Admitting: Family Medicine

## 2019-05-19 DIAGNOSIS — Z1231 Encounter for screening mammogram for malignant neoplasm of breast: Secondary | ICD-10-CM

## 2019-05-19 DIAGNOSIS — G8929 Other chronic pain: Secondary | ICD-10-CM

## 2019-05-19 DIAGNOSIS — M545 Low back pain: Secondary | ICD-10-CM

## 2019-05-19 DIAGNOSIS — G894 Chronic pain syndrome: Secondary | ICD-10-CM | POA: Diagnosis not present

## 2019-05-19 DIAGNOSIS — Z1159 Encounter for screening for other viral diseases: Secondary | ICD-10-CM

## 2019-05-19 DIAGNOSIS — M961 Postlaminectomy syndrome, not elsewhere classified: Secondary | ICD-10-CM

## 2019-05-19 DIAGNOSIS — M5136 Other intervertebral disc degeneration, lumbar region: Secondary | ICD-10-CM | POA: Diagnosis not present

## 2019-05-19 DIAGNOSIS — E669 Obesity, unspecified: Secondary | ICD-10-CM

## 2019-05-19 DIAGNOSIS — N3946 Mixed incontinence: Secondary | ICD-10-CM | POA: Insufficient documentation

## 2019-05-19 DIAGNOSIS — R7303 Prediabetes: Secondary | ICD-10-CM

## 2019-05-19 DIAGNOSIS — Z79891 Long term (current) use of opiate analgesic: Secondary | ICD-10-CM

## 2019-05-19 DIAGNOSIS — Z1211 Encounter for screening for malignant neoplasm of colon: Secondary | ICD-10-CM

## 2019-05-19 DIAGNOSIS — M51369 Other intervertebral disc degeneration, lumbar region without mention of lumbar back pain or lower extremity pain: Secondary | ICD-10-CM

## 2019-05-19 DIAGNOSIS — R0981 Nasal congestion: Secondary | ICD-10-CM

## 2019-05-19 NOTE — Progress Notes (Signed)
Name: Christina Ware   MRN: [id number]    DOB: [birth year]   Date:05/19/2019       Progress Note  Subjective  Chief Complaint  Chief Complaint  Patient presents with  . Medication Refill    I connected with  [name] on 05/19/19 at  1:40 PM EDT by telephone and verified that I am speaking with the correct person using two identifiers.  I discussed the limitations, risks, security and privacy concerns of performing an evaluation and management service by telephone and the availability of in person appointments. Staff also discussed with the patient that there may be a patient responsible charge related to this service. Patient Location: Home Provider Location: Office Additional Individuals present: None  HPI  Pt presents for routine follow up - she is new to me today; has not been seen for almost a year for routine follow up.  DDD/Chronic Pain/Chronic Opioid Use/Chronic Pain Syndrome: She took herself off of her fentanyl patches; still taking oxycodone PRN from [facility].  She sees Dr. Ethelene Hal and Skeet Simmer PA.  She is also taking gabapentin and cymbalta.  Feels like her pain has been a lot better lately; no recent injections.  Allergic rhinitis: Taking nasonex and PRN benadryl which seems to help her fall allergies.  Also struggles in the spring.  Prediabetes: last A1c was 5.9%; denies polydipsia, polyphagia, or polyuria. Due for labs.  Obesity: she was trying to lose weight; started at 213 pounds; is now down to 195lbs with diet and exercise changes.  Colon Cancer Screening: Due for cologuard in November - will order today.  Mixed incontinence:  Much better; doing well without any issues.  Patient Active Problem List   Diagnosis Date Noted  . Degenerative disc disease, lumbar 02/08/2018  . BMI 37.0-37.9, adult 12/22/2017  . Chronic nasal congestion 04/07/2017  . Warts 07/08/2016  . Obesity (BMI 35.0-39.9 without comorbidity) 07/08/2016  .  Well woman exam with routine gynecological exam 06/12/2016  . Bright red rectal bleeding 12/14/2015  . Prediabetes 07/26/2015    Past Surgical History:  Procedure Laterality Date  . BACK SURGERY  2005   lower back  . FOOT SURGERY Bilateral    twice  . KNEE ARTHROSCOPY Right     Family History  Problem Relation Age of Onset  . Diabetes Mother   . Cancer Mother   . Hypertension Mother   . Diabetes Father   . Cancer Father   . Hypertension Father   . Diabetes Sister   . Hypertension Sister   . Diabetes Brother   . Cancer Brother   . Hypertension Brother   . Diabetes Brother   . Birth defects Brother        born with a hole in his heart  . Lung disease Brother        patient needed a lung transplant  . Heart disease Brother        needed a heart transplant    Social History   Socioeconomic History  . Marital status: Divorced    Spouse name: Not on file  . Number of children: 0  . Years of education: Not on file  . Highest education level: Not on file  Occupational History  . Occupation: Retired  Engineer, production  . Financial resource strain: Not hard at all  . Food insecurity    Worry: Never true    Inability: Never true  . Transportation needs    Medical: No  Non-medical: No  Tobacco Use  . Smoking status: Former Research scientist (life sciences)  . Smokeless tobacco: Never Used  . Tobacco comment: quit when she was 68yrs old  Substance and Sexual Activity  . Alcohol use: No    Alcohol/week: 0.0 standard drinks  . Drug use: No  . Sexual activity: Not Currently  Lifestyle  . Physical activity    Days per week: 0 days    Minutes per session: 0 min  . Stress: Not at all  Relationships  . Social Herbalist on phone: Never    Gets together: Never    Attends religious service: Never    Active member of club or organization: No    Attends meetings of clubs or organizations: Never    Relationship status: Divorced  . Intimate partner violence    Fear of current or ex  partner: No    Emotionally abused: No    Physically abused: No    Forced sexual activity: No  Other Topics Concern  . Not on file  Social History Narrative  . Not on file     Current Outpatient Medications:  .  Acetylcarnitine HCl (ACETYL L-CARNITINE PO), Take 400 mg by mouth., Disp: , Rfl:  .  Alpha-Lipoic Acid 200 MG TABS, Take 1 tablet by mouth daily. , Disp: , Rfl:  .  BIOTIN MAXIMUM PO, Take by mouth., Disp: , Rfl:  .  Black Cohosh ([employer] BLACK COHOSH) 540 MG CAPS, Take 1 capsule by mouth daily. , Disp: , Rfl:  .  docusate sodium (COLACE) 100 MG capsule, Take 200 mg by mouth daily as needed. , Disp: , Rfl:  .  DULoxetine (CYMBALTA) 30 MG capsule, TAKE 1 CAPSULE BY MOUTH ONCE DAILY, Disp: 90 capsule, Rfl: 0 .  Emollient (COLLAGEN EX), Apply topically., Disp: , Rfl:  .  gabapentin (NEURONTIN) 800 MG tablet, Take 800 mg by mouth 2 (two) times daily as needed. , Disp: , Rfl:  .  L-Arginine 500 MG CAPS, Take 1 capsule by mouth daily. , Disp: , Rfl:  .  Lecithin 1200 MG CAPS, Take by mouth., Disp: , Rfl:  .  Magnesium 250 MG TABS, Take by mouth., Disp: , Rfl:  .  mometasone (NASONEX) 50 MCG/ACT nasal spray, Two sprays in each nostril once a day (if needed during allergy season), Disp: 16 g, Rfl: 11 .  Omega-3 Fatty Acids (FISH OIL) 1000 MG CAPS, Take 1 capsule by mouth daily. , Disp: , Rfl:  .  Oxycodone HCl 10 MG TABS, oxycodone 10 mg tablet  Take 1 tablet 4 times a day by oral route as needed., Disp: , Rfl:  .  promethazine (PHENERGAN) 12.5 MG tablet, Take 1 tablet (12.5 mg total) by mouth every 8 (eight) hours as needed for nausea or vomiting., Disp: 30 tablet, Rfl: 0 .  tiZANidine (ZANAFLEX) 4 MG tablet, Take 4 mg by mouth every 8 (eight) hours as needed. Reported on 02/25/2016, Disp: , Rfl:  .  triamcinolone ointment (KENALOG) 0.5 %, Apply 1 application topically 2 (two) times daily. If needed; too strong for face, underams, groin, Disp: 30 g, Rfl: 0 .  vitamin E 400 UNIT capsule, Take  400 Units by mouth daily. Reported on 02/25/2016, Disp: , Rfl:  .  fentaNYL (DURAGESIC - DOSED MCG/HR) 25 MCG/HR patch, Place 1 patch onto the skin every 3 (three) days., Disp: , Rfl:   Allergies  Allergen Reactions  . Codeine Other (See Comments)  . Hydrocodone   .  Latex     I personally reviewed active problem list, medication list, allergies, health maintenance, notes from last encounter, lab results with the patient/caregiver today.   ROS  Constitutional: Negative for fever or weight change.  Respiratory: Negative for cough and shortness of breath.   Cardiovascular: Negative for chest pain or palpitations.  Gastrointestinal: Negative for abdominal pain, no bowel changes.  Musculoskeletal: Negative for gait problem or joint swelling.  Skin: Negative for rash.  Neurological: Negative for dizziness or headache.  No other specific complaints in a complete review of systems (except as listed in HPI above).  Objective  Virtual encounter, vitals not obtained.  There is no height or weight on file to calculate BMI.  Physical Exam Pulmonary/Chest: Effort normal. No respiratory distress. Speaking in complete sentences Neurological: Pt is alert and oriented to person, place, and time. Speech is normal. Psychiatric: Patient has a normal mood and affect. behavior is normal. Judgment and thought content normal.  No results found for this or any previous visit (from the past 72 hour(s)).  PHQ2/9: Depression screen PHQ 2/9 10/[facility]/20[facility]20 11/19/2018 11/17/2018 06/25/2018 02/08/2018  Decreased Interest 0 0 0 0 0  Down, Depressed, Hopeless 0 0 0 0 1  PHQ - 2 Score 0 0 0 0 1  Altered sleeping 0 0 0 0 0  Tired, decreased energy 0 0 0 0 0  Change in appetite 0 0 0 0 0  Feeling bad or failure about yourself  0 0 0 0 0  Trouble concentrating 0 0 0 0 0  Moving slowly or fidgety/restless 0 0 0 0 0  Suicidal thoughts 0 0 0 0 0  PHQ-9 Score 0 0 0 0 1  Difficult doing work/chores Not difficult at all -  Not difficult at all Not difficult at all Not difficult at all  Some recent data might be hidden   PHQ-2/9 Result is negative.    Fall Risk: Fall Risk  05/19/2019 11/19/2018 11/17/2018 06/25/2018 02/08/2018  Falls in the past year? 0 0 0 0 No  Number falls in past yr: 0 0 0 0 -  Injury with Fall? 0 0 0 - -  Follow up Falls evaluation completed - - - -   Assessment & Plan  1. Degenerative disc disease, lumbar 2. Chronic pain syndrome 3. Chronic low back pain, unspecified back pain laterality, unspecified whether sciatica present 4. Lumbar post-laminectomy syndrome 5. Long-term current use of opiate analgesic - Seeing [facility], on PO Oxycodone   6. Chronic nasal congestion - Taking OTC antihistamine and nasanex without issue  7. Prediabetes - Dietary changes have been working well for her. - COMPLETE METABOLIC PANEL WITH GFR - Hemoglobin A1c  8. Obesity (BMI 35.0-39.9 without comorbidity) - Discussed importance of 150 minutes of physical activity weekly, eat two servings of fish weekly, eat one serving of tree nuts ( cashews, pistachios, pecans, almonds.Marland Kitchen.) every other day, eat 6 servings of fruit/vegetables daily and drink plenty of water and avoid sweet beverages.  - COMPLETE METABOLIC PANEL WITH GFR - Lipid panel - Hemoglobin A1c  9. Mixed incontinence urge and stress - Doing well - symptoms have essentially resolved since losing weight.  10. Colon cancer screening - Cologuard  11. Breast cancer screening by mammogram - MM 3D SCREEN BREAST BILATERAL; Future  12. Need for hepatitis C screening test - Hepatitis C antibody   I discussed the assessment and treatment plan with the patient. The patient was provided an opportunity to ask questions and all  were answered. The patient agreed with the plan and demonstrated an understanding of the instructions.   The patient was advised to call back or seek an in-person evaluation if the symptoms worsen or if the condition fails  to improve as anticipated.  I provided 19 minutes of non-face-to-face time during this encounter.  Doren Custard, FNP

## 2019-05-25 LAB — COMPLETE METABOLIC PANEL WITH GFR
AG Ratio: 1.3 (calc) (ref 1.0–2.5)
ALT: 16 U/L (ref 6–29)
AST: 18 U/L (ref 10–35)
Albumin: 4.2 g/dL (ref 3.6–5.1)
Alkaline phosphatase (APISO): 70 U/L (ref 37–153)
BUN: 19 mg/dL (ref 7–25)
CO2: 28 mmol/L (ref 20–32)
Calcium: 9.4 mg/dL (ref 8.6–10.4)
Chloride: 102 mmol/L (ref 98–110)
Creat: 0.66 mg/dL (ref 0.50–0.99)
GFR, Est African American: 106 mL/min/{1.73_m2} (ref >=60)
GFR, Est Non African American: 91 mL/min/{1.73_m2} (ref >=60)
Globulin: 3.2 g/dL (calc) (ref 1.9–3.7)
Glucose, Bld: 94 mg/dL (ref 65–99)
Potassium: 4.2 mmol/L (ref 3.5–5.3)
Sodium: 137 mmol/L (ref 135–146)
Total Bilirubin: 0.4 mg/dL (ref 0.2–1.2)
Total Protein: 7.4 g/dL (ref 6.1–8.1)

## 2019-05-25 LAB — LIPID PANEL
Cholesterol: 214 mg/dL — ABNORMAL HIGH (ref <200)
HDL: 92 mg/dL (ref >=50)
LDL Cholesterol (Calc): 103 mg/dL (calc) — ABNORMAL HIGH
Non-HDL Cholesterol (Calc): 122 mg/dL (calc) (ref <130)
Total CHOL/HDL Ratio: 2.3 (calc) (ref <5.0)
Triglycerides: 95 mg/dL (ref <150)

## 2019-05-25 LAB — HEPATITIS C ANTIBODY
Hepatitis C Ab: NONREACTIVE
SIGNAL TO CUT-OFF: 0.02 (ref <1.00)

## 2019-05-25 LAB — HEMOGLOBIN A1C
Hgb A1c MFr Bld: 5.9 % of total Hgb — ABNORMAL HIGH (ref <5.7)
Mean Plasma Glucose: 123 (calc)
eAG (mmol/L): 6.8 (calc)

## 2019-06-14 ENCOUNTER — Other Ambulatory Visit: Payer: Self-pay | Admitting: Family Medicine

## 2019-06-14 DIAGNOSIS — F4321 Adjustment disorder with depressed mood: Secondary | ICD-10-CM

## 2019-07-12 ENCOUNTER — Other Ambulatory Visit: Payer: Self-pay | Admitting: Family Medicine

## 2019-07-12 DIAGNOSIS — F4321 Adjustment disorder with depressed mood: Secondary | ICD-10-CM

## 2019-08-09 ENCOUNTER — Other Ambulatory Visit: Payer: Self-pay

## 2019-08-09 ENCOUNTER — Encounter: Payer: Self-pay | Admitting: Family Medicine

## 2019-08-09 ENCOUNTER — Ambulatory Visit (INDEPENDENT_AMBULATORY_CARE_PROVIDER_SITE_OTHER): Payer: Medicare Other | Admitting: Family Medicine

## 2019-08-09 DIAGNOSIS — J3489 Other specified disorders of nose and nasal sinuses: Secondary | ICD-10-CM

## 2019-08-09 DIAGNOSIS — R0981 Nasal congestion: Secondary | ICD-10-CM

## 2019-08-09 DIAGNOSIS — F4321 Adjustment disorder with depressed mood: Secondary | ICD-10-CM

## 2019-08-09 DIAGNOSIS — R05 Cough: Secondary | ICD-10-CM | POA: Diagnosis not present

## 2019-08-09 DIAGNOSIS — R058 Other specified cough: Secondary | ICD-10-CM

## 2019-08-09 MED ORDER — GUAIFENESIN ER 600 MG PO TB12: 600.0000 mg | ORAL_TABLET | Freq: Two times a day (BID) | ORAL | 0 refills | Status: DC

## 2019-08-09 MED ORDER — MOMETASONE FUROATE 50 MCG/ACT NA SUSP: NASAL | 11 refills | Status: DC

## 2019-08-09 MED ORDER — AMOXICILLIN-POT CLAVULANATE 875-125 MG PO TABS: 1.0000 | ORAL_TABLET | Freq: Two times a day (BID) | ORAL | 0 refills | Status: AC

## 2019-08-09 MED ORDER — DULOXETINE HCL 30 MG PO CPEP: 30.0000 mg | ORAL_CAPSULE | Freq: Every day | ORAL | 1 refills | Status: DC

## 2019-08-09 NOTE — Progress Notes (Signed)
Name: Christina Ware   MRN: [id number]    DOB: March 23, 1951   Date:08/09/2019       Progress Note  Subjective  Chief Complaint  Chief Complaint  Patient presents with  . Sinusitis    cough,congested, nasal drainage    I connected with  Christina Ware  on 08/09/19 at  9:20 AM EST by a video enabled telemedicine application and verified that I am speaking with the correct person using two identifiers.  I discussed the limitations of evaluation and management by telemedicine and the availability of in person appointments. The patient expressed understanding and agreed to proceed. Staff also discussed with the patient that there may be a patient responsible charge related to this service. Patient Location: Home Provider Location: Office  Additional Individuals present: None  HPI  Pt presents with concern for respiratory illness that started 08/04/2019 that has been progressively worsening.  She reports sinus congestion, rhinorrhea, soreness in the maxillary and frontal sinuses, frontal headaches, a lot of mucus production, productive cough.  Denies fevers, N/V, changes in taste/smell, chest pain, shortness of breath. She has long history of sinus infections and this feels very similar. She is taking   Patient Active Problem List   Diagnosis Date Noted  . Mixed incontinence urge and stress 05/19/2019  . Degenerative disc disease, lumbar 02/08/2018  . BMI 37.0-37.9, adult 12/22/2017  . Long-term current use of opiate analgesic 09/01/2017  . Chronic low back pain 09/01/2017  . Chronic pain syndrome 09/01/2017  . Lumbar post-laminectomy syndrome 09/01/2017  . Chronic nasal congestion 04/07/2017  . Warts 07/08/2016  . Obesity (BMI 35.0-39.9 without comorbidity) 07/08/2016  . Bright red rectal bleeding 12/14/2015  . Prediabetes 07/26/2015    Social History   Tobacco Use  . Smoking status: Former Research scientist (life sciences)  . Smokeless tobacco: Never Used  . Tobacco comment: quit when she was  68yrs old  Substance Use Topics  . Alcohol use: No    Alcohol/week: 0.0 standard drinks     Current Outpatient Medications:  .  Acetylcarnitine HCl (ACETYL L-CARNITINE PO), Take 400 mg by mouth., Disp: , Rfl:  .  Alpha-Lipoic Acid 200 MG TABS, Take 1 tablet by mouth daily. , Disp: , Rfl:  .  BIOTIN MAXIMUM PO, Take by mouth., Disp: , Rfl:  .  Black Cohosh ([employer] BLACK COHOSH) 540 MG CAPS, Take 1 capsule by mouth daily. , Disp: , Rfl:  .  docusate sodium (COLACE) 100 MG capsule, Take 200 mg by mouth daily as needed. , Disp: , Rfl:  .  DULoxetine (CYMBALTA) 30 MG capsule, TAKE 1 CAPSULE EVERY DAY, Disp: 30 capsule, Rfl: 0 .  Emollient (COLLAGEN EX), Apply topically., Disp: , Rfl:  .  gabapentin (NEURONTIN) 800 MG tablet, Take 800 mg by mouth 2 (two) times daily as needed. , Disp: , Rfl:  .  L-Arginine 500 MG CAPS, Take 1 capsule by mouth daily. , Disp: , Rfl:  .  Lecithin 1200 MG CAPS, Take by mouth., Disp: , Rfl:  .  Magnesium 250 MG TABS, Take by mouth., Disp: , Rfl:  .  mometasone (NASONEX) 50 MCG/ACT nasal spray, Two sprays in each nostril once a day (if needed during allergy season), Disp: 16 g, Rfl: 11 .  Omega-3 Fatty Acids (FISH OIL) 1000 MG CAPS, Take 1 capsule by mouth daily. , Disp: , Rfl:  .  Oxycodone HCl 10 MG TABS, oxycodone 10 mg tablet  Take 1 tablet 4 times a day by oral  route as needed., Disp: , Rfl:  .  promethazine (PHENERGAN) 12.5 MG tablet, Take 1 tablet (12.5 mg total) by mouth every 8 (eight) hours as needed for nausea or vomiting., Disp: 30 tablet, Rfl: 0 .  tiZANidine (ZANAFLEX) 4 MG tablet, Take 4 mg by mouth every 8 (eight) hours as needed. Reported on 02/25/2016, Disp: , Rfl:  .  triamcinolone ointment (KENALOG) 0.5 %, Apply 1 application topically 2 (two) times daily. If needed; too strong for face, underams, groin, Disp: 30 g, Rfl: 0 .  vitamin E 400 UNIT capsule, Take 400 Units by mouth daily. Reported on 02/25/2016, Disp: , Rfl:   Allergies  Allergen Reactions   . Codeine Other (See Comments)  . Hydrocodone   . Latex     I personally reviewed active problem list, medication list, allergies, notes from last encounter, lab results with the patient/caregiver today.  ROS  Ten systems reviewed and is negative except as mentioned in HPI  Objective  Virtual encounter, vitals not obtained.  There is no height or weight on file to calculate BMI.  Nursing Note and Vital Signs reviewed.  Physical Exam  Pulmonary/Chest: Effort normal. No respiratory distress. Speaking in complete sentences.  Neurological: Pt is alert and oriented to person, place, and time. Coordination, speech and gait are normal.  Psychiatric: Patient has a normal mood and affect. behavior is normal. Judgment and thought content normal.  No results found for this or any previous visit (from the past 72 hour(s)).  Assessment & Plan 1. Productive cough - Concerned for bacterial infection - will treat empirically with augmentin, however will test for COVID-19 as well to best determine recommendations for quarantine and ongoing care. - Novel Coronavirus, NAA ([facility]) - amoxicillin-clavulanate (AUGMENTIN) 875-125 MG tablet; Take 1 tablet by mouth 2 (two) times daily for 10 days.  Dispense: 20 tablet; Refill: 0 - guaiFENesin (MUCINEX) 600 MG 12 hr tablet; Take 1 tablet (600 mg total) by mouth 2 (two) times daily.  Dispense: 20 tablet; Refill: 0  2. Nasal sinus congestion - Novel Coronavirus, NAA ([facility]) - amoxicillin-clavulanate (AUGMENTIN) 875-125 MG tablet; Take 1 tablet by mouth 2 (two) times daily for 10 days.  Dispense: 20 tablet; Refill: 0 - mometasone (NASONEX) 50 MCG/ACT nasal spray; Two sprays in each nostril once a day (if needed during allergy season)  Dispense: 16 g; Refill: 11 - guaiFENesin (MUCINEX) 600 MG 12 hr tablet; Take 1 tablet (600 mg total) by mouth 2 (two) times daily.  Dispense: 20 tablet; Refill: 0  3. Situational depression - DULoxetine (CYMBALTA) 30 MG  capsule; Take 1 capsule (30 mg total) by mouth daily.  Dispense: 90 capsule; Refill: 1  4. Sinus pain - amoxicillin-clavulanate (AUGMENTIN) 875-125 MG tablet; Take 1 tablet by mouth 2 (two) times daily for 10 days.  Dispense: 20 tablet; Refill: 0 - mometasone (NASONEX) 50 MCG/ACT nasal spray; Two sprays in each nostril once a day (if needed during allergy season)  Dispense: 16 g; Refill: 11 - guaiFENesin (MUCINEX) 600 MG 12 hr tablet; Take 1 tablet (600 mg total) by mouth 2 (two) times daily.  Dispense: 20 tablet; Refill: 0  -Red flags and when to present for emergency care or RTC including fever >101.79F, chest pain, shortness of breath, new/worsening/un-resolving symptoms, reviewed with patient at time of visit. Follow up and care instructions discussed and provided in AVS. - I discussed the assessment and treatment plan with the patient. The patient was provided an opportunity to ask questions and all were  answered. The patient agreed with the plan and demonstrated an understanding of the instructions.  I provided 18 minutes of non-face-to-face time during this encounter.  Doren CustardEmily E Breeana Sawtelle, FNP

## 2019-08-10 ENCOUNTER — Ambulatory Visit: Payer: Medicare Other | Attending: Internal Medicine

## 2019-08-10 DIAGNOSIS — Z20822 Contact with and (suspected) exposure to covid-19: Secondary | ICD-10-CM

## 2019-08-11 LAB — NOVEL CORONAVIRUS, NAA: SARS-CoV-2, NAA: NOT DETECTED

## 2019-10-08 ENCOUNTER — Ambulatory Visit: Payer: Medicare Other | Attending: Internal Medicine

## 2019-10-08 DIAGNOSIS — Z23 Encounter for immunization: Secondary | ICD-10-CM | POA: Insufficient documentation

## 2019-10-08 NOTE — Progress Notes (Signed)
   Covid-19 Vaccination Clinic  Name:  Christina Ware    MRN: [id number] DOB: [birth year]  10/08/2019  [name] was observed post Covid-19 immunization for 15 minutes without incidence. She was provided with Vaccine Information Sheet and instruction to access the V-Safe system.   [name] was instructed to call 911 with any severe reactions post vaccine: Marland Kitchen Difficulty breathing  . Swelling of your face and throat  . A fast heartbeat  . A bad rash all over your body  . Dizziness and weakness    Immunizations Administered    Name Date Dose VIS Date Route   Moderna COVID-19 Vaccine 10/08/2019  2:11 PM 0.5 mL 07/12/2019 Intramuscular   Manufacturer: Moderna   Lot: [id number]   NDC: [id number]

## 2019-11-05 ENCOUNTER — Ambulatory Visit: Payer: Medicare Other | Attending: Internal Medicine

## 2019-11-05 DIAGNOSIS — Z23 Encounter for immunization: Secondary | ICD-10-CM

## 2019-11-05 NOTE — Progress Notes (Signed)
   Covid-19 Vaccination Clinic  Name:  Christina Ware    MRN: [id number] DOB: 30-Apr-1951  11/05/2019  [name] was observed post Covid-19 immunization for 15 minutes without incident. She was provided with Vaccine Information Sheet and instruction to access the V-Safe system.   [name] was instructed to call 911 with any severe reactions post vaccine: Marland Kitchen Difficulty breathing  . Swelling of face and throat  . A fast heartbeat  . A bad rash all over body  . Dizziness and weakness   Immunizations Administered    Name Date Dose VIS Date Route   Moderna COVID-19 Vaccine 11/05/2019  1:57 PM 0.5 mL 07/12/2019 Intramuscular   Manufacturer: Gala Murdoch   Lot: [id number]   NDC: 71595-396-72

## 2020-01-03 ENCOUNTER — Ambulatory Visit: Payer: Medicare Other | Admitting: Family Medicine

## 2020-01-16 ENCOUNTER — Encounter: Payer: Self-pay | Admitting: Family Medicine

## 2020-01-16 ENCOUNTER — Ambulatory Visit (INDEPENDENT_AMBULATORY_CARE_PROVIDER_SITE_OTHER): Payer: Medicare Other | Admitting: Family Medicine

## 2020-01-16 ENCOUNTER — Other Ambulatory Visit: Payer: Self-pay

## 2020-01-16 VITALS — BP 126/84 | HR 95 | Temp 98.1°F | Resp 14 | Ht 63.0 in | Wt 214.8 lb

## 2020-01-16 DIAGNOSIS — M545 Low back pain, unspecified: Secondary | ICD-10-CM

## 2020-01-16 DIAGNOSIS — G2581 Restless legs syndrome: Secondary | ICD-10-CM

## 2020-01-16 DIAGNOSIS — G8929 Other chronic pain: Secondary | ICD-10-CM

## 2020-01-16 DIAGNOSIS — Z79891 Long term (current) use of opiate analgesic: Secondary | ICD-10-CM

## 2020-01-16 DIAGNOSIS — F321 Major depressive disorder, single episode, moderate: Secondary | ICD-10-CM

## 2020-01-16 DIAGNOSIS — F439 Reaction to severe stress, unspecified: Secondary | ICD-10-CM | POA: Diagnosis not present

## 2020-01-16 DIAGNOSIS — G894 Chronic pain syndrome: Secondary | ICD-10-CM | POA: Diagnosis not present

## 2020-01-16 DIAGNOSIS — M5412 Radiculopathy, cervical region: Secondary | ICD-10-CM

## 2020-01-16 DIAGNOSIS — F419 Anxiety disorder, unspecified: Secondary | ICD-10-CM

## 2020-01-16 DIAGNOSIS — M961 Postlaminectomy syndrome, not elsewhere classified: Secondary | ICD-10-CM

## 2020-01-16 MED ORDER — DULOXETINE HCL 30 MG PO CPEP: 30.0000 mg | ORAL_CAPSULE | Freq: Two times a day (BID) | ORAL | 3 refills | Status: DC
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
## 2020-01-16 NOTE — Patient Instructions (Addendum)
Ask your doctor about restarting gabapentin - It is a good medicine for chronic pain and nerve pain.  I increased your cymbalta - this can help with pain  I think it would have good to restart gabapentin - it does not interact with your restless leg medicine.  Please follow up here in the next 4 weeks if you feel that your moods/depression/anxiety are still  Bad and need an additional medicine.

## 2020-01-23 ENCOUNTER — Ambulatory Visit: Payer: Self-pay | Admitting: *Deleted

## 2020-01-23 NOTE — Chronic Care Management (AMB) (Signed)
Chronic Care Management    Clinical Social Work General Note  01/23/2020 Name: Christina Ware MRN: [id number] DOB: Apr 22, 1951  Christina Ware is a 69 y.o. year old female who is a primary care patient of Christina Ware, [place]. The CCM was consulted to assist the patient with Mental Health Counseling and Resources.  Patient discussed having experienced some family stress and conflict, however it seems to have "turned out alright at this time". Per patient, she was having a bad day when she was in the office but she is relying on her faith to manage it. Per patient, she is doing fine now but is open to a follow up call next week.  Christina Ware was given information about Chronic Care Management services today including:  1. CCM service includes personalized support from designated clinical staff supervised by her physician, including individualized plan of care and coordination with other care providers 2. 24/7 contact phone numbers for assistance for urgent and routine care needs. 3. Service will only be billed when office clinical staff spend 20 minutes or more in a month to coordinate care. 4. Only one practitioner may furnish and bill the service in a calendar month. 5. The patient may stop CCM services at any time (effective at the end of the month) by phone call to the office staff. 6. The patient will be responsible for cost sharing (co-pay) of up to 20% of the service fee (after annual deductible is met).  Patient agreed to services and verbal consent obtained.   Review of patient status, including review of consultants reports, relevant laboratory and other test results, and collaboration with appropriate care team members and the patient's provider was performed as part of comprehensive patient evaluation and provision of chronic care management services.    SDOH (Social Determinants of Health) assessments and interventions performed:  Yes SDOH Interventions     Most Recent  Value  SDOH Interventions  Stress Interventions Provide Counseling       Outpatient Encounter Medications as of 01/23/2020  Medication Sig Note  . Acetylcarnitine HCl (ACETYL L-CARNITINE PO) Take 400 mg by mouth.   Marland Kitchen BIOTIN MAXIMUM PO Take by mouth.   . Black Cohosh ([facility] BLACK COHOSH) 540 MG CAPS Take 1 capsule by mouth daily.  04/19/2015: Received from: [place]  . docusate sodium (COLACE) 100 MG capsule Take 200 mg by mouth daily as needed.  04/19/2015: Received from: [place]  . DULoxetine (CYMBALTA) 30 MG capsule Take 1 capsule (30 mg total) by mouth 2 (two) times daily.   Marland Kitchen gabapentin (NEURONTIN) 800 MG tablet Take 800 mg by mouth 2 (two) times daily as needed.  07/26/2015: Received from: External Pharmacy  . guaiFENesin (MUCINEX) 600 MG 12 hr tablet Take 1 tablet (600 mg total) by mouth 2 (two) times daily.   Marland Kitchen L-Arginine 500 MG CAPS Take 1 capsule by mouth daily.    . Lecithin 1200 MG CAPS Take by mouth.   . mometasone (NASONEX) 50 MCG/ACT nasal spray Two sprays in each nostril once a day (if needed during allergy season)   . Oxycodone HCl 10 MG TABS oxycodone 10 mg tablet  Take 1 tablet 4 times a day by oral route as needed.   Marland Kitchen rOPINIRole (REQUIP) 0.5 MG tablet Take 0.5 mg by mouth daily as needed.   . triamcinolone ointment (KENALOG) 0.5 % Apply 1 application topically 2 (two) times daily. If needed; too strong for face, underams, groin   .  vitamin E 400 UNIT capsule Take 400 Units by mouth daily. Reported on 02/25/2016    No facility-administered encounter medications on file as of 01/23/2020.    Goals Addressed   None      Follow Up Plan: SW will follow up with patient by phone over the next 7-10 days to continue to assess for social work involvement       [employer], [place] Worker  [facility] Care Management [phone]

## 2020-01-23 NOTE — Patient Instructions (Signed)
Thank you allowing the Chronic Care Management Team to be a part of your care! It was a pleasure speaking with you today!   Ms. Rochelle was given information about Chronic Care Management services today including:  1. CCM service includes personalized support from designated clinical staff supervised by her physician, including individualized plan of care and coordination with other care providers 2. 24/7 contact phone numbers for assistance for urgent and routine care needs. 3. Service will only be billed when office clinical staff spend 20 minutes or more in a month to coordinate care. 4. Only one practitioner may furnish and bill the service in a calendar month. 5. The patient may stop CCM services at any time (effective at the end of the month) by phone call to the office staff. 6. The patient will be responsible for cost sharing (co-pay) of up to 20% of the service fee (after annual deductible is met).   CCM (Chronic Care Management) Team   Neldon Labella RN, BSN Nurse Care Coordinator  [phone]  [place], LCSW Clinical Social Worker [phone]  Goals Addressed   None      The patient verbalized understanding of instructions provided today and declined a print copy of patient instruction materials.   Telephone follow up appointment with care management team member scheduled for: 01/30/20

## 2020-01-26 LAB — COLOGUARD
COLOGUARD: NEGATIVE
Cologuard: NEGATIVE

## 2020-01-30 ENCOUNTER — Encounter: Payer: Self-pay | Admitting: Family Medicine

## 2020-01-30 ENCOUNTER — Ambulatory Visit: Payer: Self-pay | Admitting: *Deleted

## 2020-01-30 NOTE — Chronic Care Management (AMB) (Signed)
  Chronic Care Management   Social Work Note  01/30/2020 Name: Christina Ware MRN: [id number] DOB: 1951-07-28  Uvaldo Rising [name] is a 69 y.o. year old female who sees Danelle Berry, [place] for primary care. The CCM team was consulted for assistance with Mental Health Counseling and Resources.   Follow up phone call to patient to continue to assess for social work involvement. Per patient, she is doing much better now stating the medication she was started on is starting to make her feel like herself.  Patient declined need for any further so cial work involvement stating that she has chosen to "lover her family from a distance" . Patient agreed to contact this social worker if there are in [employer] or support needs in the future.  SDOH (Social Determinants of Health) assessments performed: No     Outpatient Encounter Medications as of 01/30/2020  Medication Sig Note  . Acetylcarnitine HCl (ACETYL L-CARNITINE PO) Take 400 mg by mouth.   Marland Kitchen BIOTIN MAXIMUM PO Take by mouth.   . Black Cohosh ([facility] BLACK COHOSH) 540 MG CAPS Take 1 capsule by mouth daily.  04/19/2015: Received from: [facility]  . docusate sodium (COLACE) 100 MG capsule Take 200 mg by mouth daily as needed.  04/19/2015: Received from: [facility]  . DULoxetine (CYMBALTA) 30 MG capsule Take 1 capsule (30 mg total) by mouth 2 (two) times daily.   Marland Kitchen gabapentin (NEURONTIN) 800 MG tablet Take 800 mg by mouth 2 (two) times daily as needed.  07/26/2015: Received from: External Pharmacy  . guaiFENesin (MUCINEX) 600 MG 12 hr tablet Take 1 tablet (600 mg total) by mouth 2 (two) times daily.   Marland Kitchen L-Arginine 500 MG CAPS Take 1 capsule by mouth daily.    . Lecithin 1200 MG CAPS Take by mouth.   . mometasone (NASONEX) 50 MCG/ACT nasal spray Two sprays in each nostril once a day (if needed during allergy season)   . Oxycodone HCl 10 MG TABS oxycodone 10 mg tablet  Take 1 tablet 4 times a day by oral route  as needed.   Marland Kitchen rOPINIRole (REQUIP) 0.5 MG tablet Take 0.5 mg by mouth daily as needed.   . triamcinolone ointment (KENALOG) 0.5 % Apply 1 application topically 2 (two) times daily. If needed; too strong for face, underams, groin   . vitamin E 400 UNIT capsule Take 400 Units by mouth daily. Reported on 02/25/2016    No facility-administered encounter medications on file as of 01/30/2020.    Goals Addressed   None     Follow Up Plan: Client will contact this social worker in the future if the need arises  Verna Czech, LCSW [facility] Care Management [phone]

## 2020-02-17 ENCOUNTER — Ambulatory Visit: Payer: Medicare Other | Admitting: Family Medicine

## 2020-05-02 ENCOUNTER — Other Ambulatory Visit: Payer: Self-pay | Admitting: Physical Medicine and Rehabilitation

## 2020-05-02 DIAGNOSIS — M542 Cervicalgia: Secondary | ICD-10-CM

## 2020-05-07 ENCOUNTER — Other Ambulatory Visit: Payer: Self-pay

## 2020-05-07 ENCOUNTER — Ambulatory Visit (INDEPENDENT_AMBULATORY_CARE_PROVIDER_SITE_OTHER): Payer: Medicare Other | Admitting: Family Medicine

## 2020-05-07 ENCOUNTER — Encounter: Payer: Self-pay | Admitting: Family Medicine

## 2020-05-07 ENCOUNTER — Ambulatory Visit
Admission: RE | Admit: 2020-05-07 | Discharge: 2020-05-07 | Disposition: A | Discharge status: Home / Self Care | Payer: Medicare Other | Attending: Family Medicine | Admitting: Family Medicine

## 2020-05-07 ENCOUNTER — Ambulatory Visit
Admission: RE | Admit: 2020-05-07 | Discharge: 2020-05-07 | Disposition: A | Discharge status: Home / Self Care | Payer: Medicare Other | Source: Ambulatory Visit | Attending: Family Medicine | Admitting: Family Medicine

## 2020-05-07 VITALS — BP 124/76 | HR 97 | Temp 98.8°F | Resp 16 | Ht 63.0 in | Wt 204.5 lb

## 2020-05-07 DIAGNOSIS — Z23 Encounter for immunization: Secondary | ICD-10-CM | POA: Diagnosis not present

## 2020-05-07 DIAGNOSIS — F419 Anxiety disorder, unspecified: Secondary | ICD-10-CM

## 2020-05-07 DIAGNOSIS — R05 Cough: Secondary | ICD-10-CM

## 2020-05-07 DIAGNOSIS — J309 Allergic rhinitis, unspecified: Secondary | ICD-10-CM

## 2020-05-07 DIAGNOSIS — R21 Rash and other nonspecific skin eruption: Secondary | ICD-10-CM

## 2020-05-07 DIAGNOSIS — F321 Major depressive disorder, single episode, moderate: Secondary | ICD-10-CM

## 2020-05-07 DIAGNOSIS — R059 Cough, unspecified: Secondary | ICD-10-CM

## 2020-05-07 DIAGNOSIS — R0602 Shortness of breath: Secondary | ICD-10-CM | POA: Diagnosis not present

## 2020-05-07 MED ORDER — FLUTICASONE PROPIONATE 50 MCG/ACT NA SUSP: 2.0000 | Freq: Every day | NASAL | 2 refills | Status: DC

## 2020-05-07 MED ORDER — ZYRTEC ALLERGY 10 MG PO TBDP: 10.0000 mg | ORAL_TABLET | Freq: Every day | ORAL | 3 refills | Status: DC

## 2020-05-07 MED ORDER — KETOCONAZOLE 2 % EX CREA: 1.0000 "application " | TOPICAL_CREAM | Freq: Every day | CUTANEOUS | 0 refills | Status: DC

## 2020-05-07 NOTE — Progress Notes (Signed)
Name: Christina Ware   MRN: [id number]    DOB: July 07, 1951   Date:05/07/2020       Progress Note  Chief Complaint  Patient presents with  . Consult    discuss testing for cancer/family history     Subjective:   Christina Ware is a 69 y.o. female, presents to clinic for routine f/up on depression and chronic cough  We increased cymbalta- to 30 mg BID, she states this has been helpful   Cough This is a recurrent problem. The current episode started more than 1 year ago. The problem has been unchanged. The cough is productive of sputum (clear to yellow or sometimes black specks). Associated symptoms include nasal congestion, postnasal drip and rhinorrhea. Pertinent negatives include no chest pain, chills, ear congestion, ear pain, fever, headaches, heartburn, hemoptysis, myalgias, rash, sore throat, shortness of breath, sweats, weight loss or wheezing. The symptoms are aggravated by lying down. Risk factors: former smoker.       Current Outpatient Medications:  .  BIOTIN MAXIMUM PO, Take by mouth., Disp: , Rfl:  .  Black Cohosh ([employer] BLACK COHOSH) 540 MG CAPS, Take 1 capsule by mouth daily. , Disp: , Rfl:  .  docusate sodium (COLACE) 100 MG capsule, Take 200 mg by mouth daily as needed. , Disp: , Rfl:  .  DULoxetine (CYMBALTA) 30 MG capsule, Take 1 capsule (30 mg total) by mouth 2 (two) times daily., Disp: 180 capsule, Rfl: 3 .  gabapentin (NEURONTIN) 800 MG tablet, Take 800 mg by mouth 2 (two) times daily as needed. , Disp: , Rfl:  .  L-Arginine 500 MG CAPS, Take 1 capsule by mouth daily. , Disp: , Rfl:  .  Lecithin 1200 MG CAPS, Take by mouth., Disp: , Rfl:  .  Oxycodone HCl 10 MG TABS, oxycodone 10 mg tablet  Take 1 tablet 4 times a day by oral route as needed., Disp: , Rfl:  .  rOPINIRole (REQUIP) 0.5 MG tablet, Take 0.5 mg by mouth daily as needed., Disp: , Rfl:  .  triamcinolone ointment (KENALOG) 0.5 %, Apply 1 application topically 2 (two) times daily. If needed; too  strong for face, underams, groin, Disp: 30 g, Rfl: 0 .  vitamin E 400 UNIT capsule, Take 400 Units by mouth daily. Reported on 02/25/2016, Disp: , Rfl:   Patient Active Problem List   Diagnosis Date Noted  . Mixed incontinence urge and stress 05/19/2019  . Degenerative disc disease, lumbar 02/08/2018  . BMI 37.0-37.9, adult 12/22/2017  . Long-term current use of opiate analgesic 09/01/2017  . Chronic low back pain 09/01/2017  . Chronic pain syndrome 09/01/2017  . Lumbar post-laminectomy syndrome 09/01/2017  . Chronic nasal congestion 04/07/2017  . Warts 07/08/2016  . Obesity (BMI 35.0-39.9 without comorbidity) 07/08/2016  . Bright red rectal bleeding 12/14/2015  . Prediabetes 07/26/2015    Past Surgical History:  Procedure Laterality Date  . BACK SURGERY  2005   lower back  . FOOT SURGERY Bilateral    twice  . KNEE ARTHROSCOPY Right     Family History  Problem Relation Age of Onset  . Diabetes Mother   . Cancer Mother   . Hypertension Mother   . Diabetes Father   . Cancer Father   . Hypertension Father   . Diabetes Sister   . Hypertension Sister   . Diabetes Brother   . Cancer Brother   . Hypertension Brother   . Diabetes Brother   . Birth defects  Brother        born with a hole in his heart  . Lung disease Brother        patient needed a lung transplant  . Heart disease Brother        needed a heart transplant    Social History   Tobacco Use  . Smoking status: Former Games developer  . Smokeless tobacco: Never Used  . Tobacco comment: quit when she was 69yrs old  Vaping Use  . Vaping Use: Never used  Substance Use Topics  . Alcohol use: No    Alcohol/week: 0.0 standard drinks  . Drug use: No     Allergies  Allergen Reactions  . Codeine Other (See Comments)  . Hydrocodone   . Latex     Health Maintenance  Topic Date Due  . MAMMOGRAM  10/01/2017  . PNA vac Low Risk Adult (2 of 2 - PPSV23) 04/07/2018  . TETANUS/TDAP  06/02/2026 (Originally 02/16/1971)  .  Fecal DNA (Cologuard)  01/24/2023  . INFLUENZA VACCINE  Completed  . DEXA SCAN  Completed  . COVID-19 Vaccine  Completed  . Hepatitis C Screening  Completed    Chart Review Today: I personally reviewed active problem list, medication list, allergies, family history, social history, health maintenance, notes from last encounter, lab results, imaging with the patient/caregiver today.   Review of Systems  Constitutional: Negative for chills, fever and weight loss.  HENT: Positive for postnasal drip and rhinorrhea. Negative for ear pain and sore throat.   Respiratory: Positive for cough. Negative for hemoptysis, shortness of breath and wheezing.   Cardiovascular: Negative for chest pain.  Gastrointestinal: Negative for heartburn.  Musculoskeletal: Negative for myalgias.  Skin: Negative for rash.  Neurological: Negative for headaches.  10 Systems reviewed and are negative for acute change except as noted in the HPI.    Objective:   Vitals:   05/07/20 0830  BP: 124/76  Pulse: 97  Resp: 16  Temp: 98.8 F (37.1 C)  TempSrc: Oral  SpO2: 92%  Weight: 204 lb 8 oz (92.8 kg)  Height: 5\' 3"  (1.6 m)    Body mass index is 36.23 kg/m.  Physical Exam Vitals and nursing note reviewed.  Constitutional:      General: She is not in acute distress.    Appearance: Normal appearance. She is not ill-appearing, toxic-appearing or diaphoretic.  HENT:     Head: Normocephalic and atraumatic.     Right Ear: External ear normal.     Left Ear: External ear normal.     Nose: Congestion and rhinorrhea present.     Mouth/Throat:     Mouth: Mucous membranes are moist.     Pharynx: Oropharynx is clear.  Eyes:     General:        Right eye: No discharge.        Left eye: No discharge.     Conjunctiva/sclera: Conjunctivae normal.  Cardiovascular:     Rate and Rhythm: Normal rate and regular rhythm.     Pulses: Normal pulses.     Heart sounds: Normal heart sounds.  Pulmonary:     Effort:  Pulmonary effort is normal. No respiratory distress.     Breath sounds: Normal breath sounds. No stridor. No wheezing or rhonchi.  Skin:    General: Skin is warm and dry.     Coloration: Skin is not jaundiced or pale.     Findings: Rash present.  Neurological:     Mental Status: She is  alert. Mental status is at baseline.  Psychiatric:        Mood and Affect: Mood normal.        Behavior: Behavior normal.         Assessment & Plan:   1. Current moderate episode of major depressive disorder, unspecified whether recurrent (HCC) 2. Anxiety disorder, unspecified type PHQ reviewed, sx and mood improving with cymbalta 30 mg BID  3. Need for influenza vaccination - Flu Vaccine QUAD High Dose(Fluad)  4. Cough eval for cough, suspect may be due to post-nasal drip Encouraged to tx allergies, get xray, f/up pending results and sx - DG Chest 2 View; Future - Cetirizine HCl (ZYRTEC ALLERGY) 10 MG TBDP; Take 10 mg by mouth at bedtime.  Dispense: 30 tablet; Refill: 3 - fluticasone (FLONASE) 50 MCG/ACT nasal spray; Place 2 sprays into both nostrils daily.  Dispense: 16 g; Refill: 2  5. Allergic rhinitis, unspecified seasonality, unspecified trigger - Cetirizine HCl (ZYRTEC ALLERGY) 10 MG TBDP; Take 10 mg by mouth at bedtime.  Dispense: 30 tablet; Refill: 3 - fluticasone (FLONASE) 50 MCG/ACT nasal spray; Place 2 sprays into both nostrils daily.  Dispense: 16 g; Refill: 2  6. Rash and nonspecific skin eruption Appears fungal in nature - ketoconazole (NIZORAL) 2 % cream; Apply 1 application topically daily.  Dispense: 15 g; Refill: 0   Danelle Berry, PA-C 05/07/20 8:47 AM

## 2020-05-20 ENCOUNTER — Inpatient Hospital Stay: Admission: RE | Admit: 2020-05-20 | Payer: Medicare Other | Source: Ambulatory Visit

## 2020-05-22 ENCOUNTER — Other Ambulatory Visit: Payer: Self-pay | Admitting: Physical Medicine and Rehabilitation

## 2020-05-22 DIAGNOSIS — M542 Cervicalgia: Secondary | ICD-10-CM

## 2020-05-25 ENCOUNTER — Ambulatory Visit
Admission: RE | Admit: 2020-05-25 | Discharge: 2020-05-25 | Disposition: A | Discharge status: Home / Self Care | Payer: Medicare Other | Source: Ambulatory Visit | Attending: Physical Medicine and Rehabilitation | Admitting: Physical Medicine and Rehabilitation

## 2020-05-25 DIAGNOSIS — M542 Cervicalgia: Secondary | ICD-10-CM

## 2020-05-30 ENCOUNTER — Ambulatory Visit: Payer: Self-pay | Admitting: *Deleted

## 2020-05-30 NOTE — Chronic Care Management (AMB) (Signed)
CCM enrollment status changed to previously enrolled.   Natanel Snavely, LCSW Clinical Social Worker  [facility] Care Management [phone]  

## 2020-06-12 ENCOUNTER — Encounter: Payer: Self-pay | Admitting: Family Medicine

## 2020-06-19 ENCOUNTER — Other Ambulatory Visit: Payer: Self-pay | Admitting: Family Medicine

## 2020-06-19 DIAGNOSIS — Z1231 Encounter for screening mammogram for malignant neoplasm of breast: Secondary | ICD-10-CM

## 2020-08-20 ENCOUNTER — Ambulatory Visit: Payer: Self-pay | Admitting: *Deleted

## 2020-08-20 NOTE — Telephone Encounter (Signed)
  Patient's niece called with patient present to report patient has been vomiting x 6 or more today. Patient has been tested for covid and is positive x 4 days ago. C/o fever, lower abdominal pain, burning, body aches, headaches. Patient has vomited up mucus per niece. Denies vomiting blood or coffee grounds colored emesis. Lips dry and cracking, mouth dry, weak to stand. Reports urinating today. Instructed patient 's niece to take patient ot ED for evaluation of dehydration and to report patient is covid positive. Care advise given. Patient and niece verbalized understanding of care advise and to go to ED now.   Reason for Disposition . [1] SEVERE vomiting (e.g., 6 or more times/day) AND [2] present > 8 hours (Exception: patient sounds well, is drinking liquids, does not sound dehydrated, and vomiting has lasted less than 24 hours)  Answer Assessment - Initial Assessment Questions 1. VOMITING SEVERITY: "How many times have you vomited in the past 24 hours?"     - MILD:  1 - 2 times/day    - MODERATE: 3 - 5 times/day, decreased oral intake without significant weight loss or symptoms of dehydration    - SEVERE: 6 or more times/day, vomits everything or nearly everything, with significant weight loss, symptoms of dehydration      severe 2. ONSET: "When did the vomiting begin?"      A week or more  3. FLUIDS: "What fluids or food have you vomited up today?" "Have you been able to keep any fluids down?"     Water, juice and chicken broth. Than vomits within minutes  4. ABDOMINAL PAIN: "Are your having any abdominal pain?" If yes : "How bad is it and what does it feel like?" (e.g., crampy, dull, intermittent, constant)      Abdominal pain , burning , constant  5. DIARRHEA: "Is there any diarrhea?" If Yes, ask: "How many times today?"      No  6. CONTACTS: "Is there anyone else in the family with the same symptoms?"      Yes great nephew  7. CAUSE: "What do you think is causing your vomiting?"      unknown 8. HYDRATION STATUS: "Any signs of dehydration?" (e.g., dry mouth [not only dry lips], too weak to stand) "When did you last urinate?"     Yes . Lips dry , mouth dry,  9. OTHER SYMPTOMS: "Do you have any other symptoms?" (e.g., fever, headache, vertigo, vomiting blood or coffee grounds, recent head injury)     Headache , body aches , abdominal pain, and burning .  10. PREGNANCY: "Is there any chance you are pregnant?" "When was your last menstrual period?"       Na  Protocols used: [facility]

## 2020-08-21 NOTE — Telephone Encounter (Signed)
FYI

## 2020-08-22 ENCOUNTER — Other Ambulatory Visit: Payer: Self-pay

## 2020-08-22 ENCOUNTER — Encounter: Payer: Self-pay | Admitting: Internal Medicine

## 2020-08-22 ENCOUNTER — Telehealth (INDEPENDENT_AMBULATORY_CARE_PROVIDER_SITE_OTHER): Payer: Medicare Other | Admitting: Internal Medicine

## 2020-08-22 DIAGNOSIS — R112 Nausea with vomiting, unspecified: Secondary | ICD-10-CM | POA: Diagnosis not present

## 2020-08-22 DIAGNOSIS — E860 Dehydration: Secondary | ICD-10-CM

## 2020-08-22 DIAGNOSIS — U071 COVID-19: Secondary | ICD-10-CM | POA: Diagnosis not present

## 2020-08-22 MED ORDER — ONDANSETRON HCL 4 MG PO TABS: 4.0000 mg | ORAL_TABLET | Freq: Three times a day (TID) | ORAL | 1 refills | Status: DC | PRN

## 2020-08-22 NOTE — Progress Notes (Signed)
Name: Christina Ware   MRN: [id number]    DOB: [birth year]   Date:08/22/2020       Progress Note  Subjective  Chief Complaint  Chief Complaint  Patient presents with  . Emesis    For 8 days, cannot keep food down  . Diarrhea  . Headache    Chills, bodyache, sore throat,cough  . Covid Positive    At home test was positive on 1/7    I connected with  Uvaldo Rising Smelcer on 08/22/20 at  3:00 PM EST by telephone and verified that I am speaking with the correct person using two identifiers.  I discussed the limitations, risks, security and privacy concerns of performing an evaluation and management service by telephone and the availability of in person appointments. The patient expressed understanding and agreed to proceed. Staff also discussed with the patient that there may be a patient responsible charge related to this service. Patient Location: Home Provider Location: [facility] Additional Individuals present: none  HPI Patient is a 70 year old female patient of Danelle Berry A concerning message was left by the patient's niece on 08/20/2020, and it was recommended that she be seen in the emergency room. She did not proceed to the emergency room at that time. EMTs came to her house and they tried to make her go and she stated she would not go.  When it was noted she did not proceed to an emergency setting, her PCP asked for the patient to be called for a follow-up an appointment was scheduled with me today by phone. She is COVID-positive.  Patient with Covid, test result was positive on 1/7 Sx's started 9 days ago Most concerning has been her persistent vomiting She noted today is the best she has felt in 9 days, able to keep some things down today for the first time, chicken noodle soup, crackers, pedialyte. Not vomit after soup and pedialyte.  No blood with vomiting.  No more cough, minimal prior,  No marked SOB + fever off and on until today and not had any today,  + mild sore  throat and PND (noted has been going on for years) No loss of smell, everything tastes terrible + N/V + muscle aches, + HA, + weak and tired No marked loose stools/diarrhea in last couple days, had prior No CP,  passing out episodes  Taking pedialyte, ibuprofen prn, flonase nasal spray  Comorbid conditions reviewed  No h/o asthma/COPD No h/o DM, + prediabetes + obesity       Patient Active Problem List   Diagnosis Date Noted  . Mixed incontinence urge and stress 05/19/2019  . Degenerative disc disease, lumbar 02/08/2018  . BMI 37.0-37.9, adult 12/22/2017  . Long-term current use of opiate analgesic 09/01/2017  . Chronic low back pain 09/01/2017  . Chronic pain syndrome 09/01/2017  . Lumbar post-laminectomy syndrome 09/01/2017  . Chronic nasal congestion 04/07/2017  . Warts 07/08/2016  . Obesity (BMI 35.0-39.9 without comorbidity) 07/08/2016  . Bright red rectal bleeding 12/14/2015  . Prediabetes 07/26/2015    Past Surgical History:  Procedure Laterality Date  . BACK SURGERY  2005   lower back  . FOOT SURGERY Bilateral    twice  . KNEE ARTHROSCOPY Right     Family History  Problem Relation Age of Onset  . Diabetes Mother   . Cancer Mother   . Hypertension Mother   . Diabetes Father   . Cancer Father   . Hypertension Father   . Diabetes Sister   .  Hypertension Sister   . Diabetes Brother   . Cancer Brother   . Hypertension Brother   . Diabetes Brother   . Birth defects Brother        born with a hole in his heart  . Lung disease Brother        patient needed a lung transplant  . Heart disease Brother        needed a heart transplant    Social History   Tobacco Use  . Smoking status: Former Games developer  . Smokeless tobacco: Never Used  . Tobacco comment: quit when she was 70yrs old  Substance Use Topics  . Alcohol use: No    Alcohol/week: 0.0 standard drinks     Current Outpatient Medications:  .  BIOTIN MAXIMUM PO, Take by mouth., Disp: , Rfl:   .  Black Cohosh 540 MG CAPS, Take 1 capsule by mouth daily. , Disp: , Rfl:  .  Cetirizine HCl (ZYRTEC ALLERGY) 10 MG TBDP, Take 10 mg by mouth at bedtime., Disp: 30 tablet, Rfl: 3 .  docusate sodium (COLACE) 100 MG capsule, Take 200 mg by mouth daily as needed. , Disp: , Rfl:  .  DULoxetine (CYMBALTA) 30 MG capsule, Take 1 capsule (30 mg total) by mouth 2 (two) times daily., Disp: 180 capsule, Rfl: 3 .  fluticasone (FLONASE) 50 MCG/ACT nasal spray, Place 2 sprays into both nostrils daily., Disp: 16 g, Rfl: 2 .  gabapentin (NEURONTIN) 800 MG tablet, Take 800 mg by mouth 2 (two) times daily as needed. , Disp: , Rfl:  .  ketoconazole (NIZORAL) 2 % cream, Apply 1 application topically daily., Disp: 15 g, Rfl: 0 .  L-Arginine 500 MG CAPS, Take 1 capsule by mouth daily. , Disp: , Rfl:  .  Lecithin 1200 MG CAPS, Take by mouth., Disp: , Rfl:  .  Oxycodone HCl 10 MG TABS, oxycodone 10 mg tablet  Take 1 tablet 4 times a day by oral route as needed., Disp: , Rfl:  .  rOPINIRole (REQUIP) 0.5 MG tablet, Take 0.5 mg by mouth daily as needed., Disp: , Rfl:  .  triamcinolone ointment (KENALOG) 0.5 %, Apply 1 application topically 2 (two) times daily. If needed; too strong for face, underams, groin, Disp: 30 g, Rfl: 0 .  vitamin E 400 UNIT capsule, Take 400 Units by mouth daily. Reported on 02/25/2016, Disp: , Rfl:   Allergies  Allergen Reactions  . Codeine Other (See Comments)  . Hydrocodone   . Latex     With staff assistance, above reviewed with the patient/caregiver today.  ROS: As per HPI, otherwise no specific complaints on a limited and focused system review   Objective  Virtual encounter, vitals not obtained.  There is no height or weight on file to calculate BMI.  Physical Exam   Appears in NAD via conversation Pulmonary/Chest: No obvious respiratory distress. Speaking in complete sentences Neurological: Pt is alert and oriented, Speech is normal Psychiatric: Patient has a normal mood  and affect, behavior is normal. Judgment and thought content normal.   No results found for this or any previous visit (from the past 72 hour(s)).  PHQ2/9: Depression screen [facility] 2/9 08/22/2020 05/07/2020 01/23/2020 01/16/2020 08/09/2019  Decreased Interest 0 0 0 0 0  Down, Depressed, Hopeless 0 0 0 0 0  PHQ - 2 Score 0 0 0 0 0  Altered sleeping - - - 3 0  Tired, decreased energy - - - 3 0  Change in appetite - - -  0 0  Feeling bad or failure about yourself  - - - 0 0  Trouble concentrating - - - 0 0  Moving slowly or fidgety/restless - - - 0 0  Suicidal thoughts - - - 0 0  PHQ-9 Score - - - 6 0  Difficult doing work/chores - - - Not difficult at all -  Some recent data might be hidden   PHQ-2/9 Result reviewed  Fall Risk: Fall Risk  08/22/2020 05/07/2020 01/16/2020 08/09/2019 05/19/2019  Falls in the past year? 0 0 0 0 0  Number falls in past yr: 0 0 0 0 0  Injury with Fall? 0 0 0 0 0  Follow up Falls evaluation completed Falls evaluation completed - Falls evaluation completed Falls evaluation completed     Assessment & Plan  1. COVID 2. Nausea and vomiting, intractability of vomiting not specified, unspecified vomiting type/dehydration concerns Patient diagnosed with a gastroenteritis prior to her COVID diagnosis, with real concerns for her persistent vomiting and hydration status noted.  It was recommended she go to an emergency room setting previously, and she did not go.  She had a EMT at her house, and they tried to force her to go as she stated, and she still would not go. She notes today she has had the best day in a long time, and is feeling a little better.  She was able to keep Pedialyte down, soups down, although still feels she is markedly dehydrated based on her history.  Still very weak and fatigued. She noted she would not go to the hospital due to the long waits, especially now when she is having a better day . I offered trying to get seen tonight at respiratory clinic and  she noted not need to be seen and today was a big turnaround.  She asked if there was something she could get for nausea, and will prescribe a Zofran product to help. - ondansetron (ZOFRAN) 4 MG tablet; Take 1 tablet (4 mg total) by mouth every 8 (eight) hours as needed for nausea or vomiting.  Dispense: 12 tablet; Refill: 1  Recommended continuing to increase fluids, using Pedialyte and water, and soup broth and soup also helpful.  Emphasized if she has more vomiting tonight or tomorrow, she needs to go to the emergency room, and she agreed that she would do so.  Also if more concerning symptoms arise with marked shortness of breath, chest pains, she needs to be seen in the emergency setting as well. She should try to avoid ibuprofen products noting the nausea and vomiting. Emphasized at length that low yield to be seen more emergently, and i hope she was understanding of that and will proceed as discussed today.  I discussed the assessment and treatment plan with the patient. The patient was provided an opportunity to ask questions and all were answered. The patient agreed with the plan and demonstrated an understanding of the instructions.  Red flags and when to present for emergency care or RTC including fevers , chest pain, shortness of breath, new/worsening/un-resolving symptoms reviewed with patient at time of visit.   The patient was advised to call back or seek an in-person evaluation if the symptoms worsen or if the condition fails to improve as anticipated.  I provided 20 minutes of non-face-to-face time during this encounter that included discussing at length patient's sx/history, pertinent pmhx, medications, treatment and follow up plan. This time also included the necessary documentation, orders, and chart review.  Kapri Nero D  Dorris Fetch, MD

## 2020-08-22 NOTE — Telephone Encounter (Signed)
Please make appointment.

## 2020-08-22 NOTE — Telephone Encounter (Signed)
Scheduled pt a virtual appt with Dr Dorris Fetch

## 2020-10-03 ENCOUNTER — Ambulatory Visit
Admission: RE | Admit: 2020-10-03 | Discharge: 2020-10-03 | Disposition: A | Discharge status: Home / Self Care | Payer: Medicare Other | Source: Ambulatory Visit | Attending: Family Medicine | Admitting: Family Medicine

## 2020-10-03 ENCOUNTER — Other Ambulatory Visit: Payer: Self-pay

## 2020-10-03 DIAGNOSIS — Z1231 Encounter for screening mammogram for malignant neoplasm of breast: Secondary | ICD-10-CM | POA: Diagnosis present

## 2020-10-08 ENCOUNTER — Other Ambulatory Visit: Payer: Self-pay | Admitting: Family Medicine

## 2020-10-08 DIAGNOSIS — R928 Other abnormal and inconclusive findings on diagnostic imaging of breast: Secondary | ICD-10-CM

## 2020-10-08 DIAGNOSIS — R921 Mammographic calcification found on diagnostic imaging of breast: Secondary | ICD-10-CM

## 2020-10-19 ENCOUNTER — Other Ambulatory Visit: Payer: Self-pay

## 2020-10-19 ENCOUNTER — Ambulatory Visit
Admission: RE | Admit: 2020-10-19 | Discharge: 2020-10-19 | Disposition: A | Discharge status: Home / Self Care | Payer: Medicare Other | Source: Ambulatory Visit | Attending: Family Medicine | Admitting: Family Medicine

## 2020-10-19 DIAGNOSIS — R928 Other abnormal and inconclusive findings on diagnostic imaging of breast: Secondary | ICD-10-CM | POA: Diagnosis present

## 2020-10-19 DIAGNOSIS — R921 Mammographic calcification found on diagnostic imaging of breast: Secondary | ICD-10-CM | POA: Diagnosis present

## 2020-10-22 ENCOUNTER — Other Ambulatory Visit: Payer: Self-pay | Admitting: Family Medicine

## 2020-10-22 DIAGNOSIS — R928 Other abnormal and inconclusive findings on diagnostic imaging of breast: Secondary | ICD-10-CM

## 2020-10-22 DIAGNOSIS — R921 Mammographic calcification found on diagnostic imaging of breast: Secondary | ICD-10-CM

## 2020-10-22 NOTE — Progress Notes (Signed)
Sign orders from radiology/breast center

## 2020-10-25 ENCOUNTER — Ambulatory Visit
Admission: RE | Admit: 2020-10-25 | Discharge: 2020-10-25 | Disposition: A | Discharge status: Home / Self Care | Payer: Medicare Other | Source: Ambulatory Visit | Attending: Family Medicine | Admitting: Family Medicine

## 2020-10-25 ENCOUNTER — Encounter: Payer: Self-pay | Admitting: Family Medicine

## 2020-10-25 ENCOUNTER — Other Ambulatory Visit: Payer: Self-pay

## 2020-10-25 ENCOUNTER — Telehealth (INDEPENDENT_AMBULATORY_CARE_PROVIDER_SITE_OTHER): Payer: Medicare Other | Admitting: Family Medicine

## 2020-10-25 VITALS — Ht 63.0 in | Wt 210.0 lb

## 2020-10-25 DIAGNOSIS — R7303 Prediabetes: Secondary | ICD-10-CM

## 2020-10-25 DIAGNOSIS — R112 Nausea with vomiting, unspecified: Secondary | ICD-10-CM | POA: Diagnosis not present

## 2020-10-25 DIAGNOSIS — R251 Tremor, unspecified: Secondary | ICD-10-CM

## 2020-10-25 DIAGNOSIS — R258 Other abnormal involuntary movements: Secondary | ICD-10-CM | POA: Diagnosis not present

## 2020-10-25 DIAGNOSIS — R921 Mammographic calcification found on diagnostic imaging of breast: Secondary | ICD-10-CM | POA: Insufficient documentation

## 2020-10-25 DIAGNOSIS — G2581 Restless legs syndrome: Secondary | ICD-10-CM | POA: Diagnosis not present

## 2020-10-25 DIAGNOSIS — E559 Vitamin D deficiency, unspecified: Secondary | ICD-10-CM

## 2020-10-25 DIAGNOSIS — M2550 Pain in unspecified joint: Secondary | ICD-10-CM

## 2020-10-25 DIAGNOSIS — G609 Hereditary and idiopathic neuropathy, unspecified: Secondary | ICD-10-CM

## 2020-10-25 DIAGNOSIS — R928 Other abnormal and inconclusive findings on diagnostic imaging of breast: Secondary | ICD-10-CM | POA: Insufficient documentation

## 2020-10-25 DIAGNOSIS — R6889 Other general symptoms and signs: Secondary | ICD-10-CM

## 2020-10-25 DIAGNOSIS — R799 Abnormal finding of blood chemistry, unspecified: Secondary | ICD-10-CM

## 2020-10-25 DIAGNOSIS — E785 Hyperlipidemia, unspecified: Secondary | ICD-10-CM

## 2020-10-25 HISTORY — PX: BREAST BIOPSY: SHX20

## 2020-10-25 NOTE — Progress Notes (Addendum)
Name: Christina Ware   MRN: [id number]    DOB: 1951/08/02   Date:10/25/2020       Progress Note  Subjective:    Chief Complaint  Chief Complaint  Patient presents with  . Tremors    In right hand getting worse    I connected with  Uvaldo Rising Klepacki on 10/25/20 at 11:40 AM EDT by telephone and verified that I am speaking with the correct person using two identifiers.   I discussed the limitations, risks, security and privacy concerns of performing an evaluation and management service by telephone and the availability of in person appointments. Staff also discussed with the patient that there may be a patient responsible charge related to this service.  Patient verbalized understanding and agreed to proceed with encounter. Patient Location: home Provider Location: [facility] Additional Individuals present: none  HPI Tremor to right hand started about 3-4 months ago, worsening, now having some jerking motion, noticed it first when she was writing checks and her hand jerked across the page, it happens randomly. She denies numbness, weakness, pain, swelling, muscle spasms or contractures RHD female She previously saw neurology Dr. Sherryll Burger but it has been years  Skeet Simmer -   No labs for almost 1.5 years Hx of prediabetes and HLD both managed with diet/lifestyle She manages chronic pain with ortho/pain specialists  Patient Active Problem List   Diagnosis Date Noted  . COVID 08/22/2020  . Mixed incontinence urge and stress 05/19/2019  . Degenerative disc disease, lumbar 02/08/2018  . BMI 37.0-37.9, adult 12/22/2017  . Long-term current use of opiate analgesic 09/01/2017  . Chronic low back pain 09/01/2017  . Chronic pain syndrome 09/01/2017  . Lumbar post-laminectomy syndrome 09/01/2017  . Chronic nasal congestion 04/07/2017  . Warts 07/08/2016  . Obesity (BMI 35.0-39.9 without comorbidity) 07/08/2016  . Bright red rectal bleeding 12/14/2015  . Prediabetes 07/26/2015     Social History   Tobacco Use  . Smoking status: Former Games developer  . Smokeless tobacco: Never Used  . Tobacco comment: quit when she was 71yrs old  Substance Use Topics  . Alcohol use: No    Alcohol/week: 0.0 standard drinks     Current Outpatient Medications:  .  Black Cohosh 540 MG CAPS, Take 1 capsule by mouth daily. , Disp: , Rfl:  .  Cetirizine HCl (ZYRTEC ALLERGY) 10 MG TBDP, Take 10 mg by mouth at bedtime., Disp: 30 tablet, Rfl: 3 .  docusate sodium (COLACE) 100 MG capsule, Take 200 mg by mouth daily as needed. , Disp: , Rfl:  .  DULoxetine (CYMBALTA) 30 MG capsule, Take 1 capsule (30 mg total) by mouth 2 (two) times daily., Disp: 180 capsule, Rfl: 3 .  fentaNYL (DURAGESIC) 50 MCG/HR, APPLY 1 PATCH TOPICALLY TO THE SKIN EVERY 72 HOURS, Disp: , Rfl:  .  fluticasone (FLONASE) 50 MCG/ACT nasal spray, Place 2 sprays into both nostrils daily., Disp: 16 g, Rfl: 2 .  gabapentin (NEURONTIN) 800 MG tablet, Take 800 mg by mouth 2 (two) times daily as needed. , Disp: , Rfl:  .  ketoconazole (NIZORAL) 2 % cream, Apply 1 application topically daily., Disp: 15 g, Rfl: 0 .  L-Arginine 500 MG CAPS, Take 1 capsule by mouth daily. , Disp: , Rfl:  .  Lecithin 1200 MG CAPS, Take by mouth., Disp: , Rfl:  .  naloxone (NARCAN) nasal spray 4 mg/0.1 mL, Narcan 4 mg/actuation nasal spray  Take by nasal route every 3 minutes until patient awakes or  EMS arrives., Disp: , Rfl:  .  ondansetron (ZOFRAN) 4 MG tablet, Take 1 tablet (4 mg total) by mouth every 8 (eight) hours as needed for nausea or vomiting., Disp: 12 tablet, Rfl: 1 .  Oxycodone HCl 10 MG TABS, oxycodone 10 mg tablet  Take 1 tablet 4 times a day by oral route as needed., Disp: , Rfl:  .  predniSONE (DELTASONE) 10 MG tablet, prednisone 10 mg tablet  Take 6 tablets po x1 day, 5 tablets po x 1 day, 4 tablets po x 1 day, 3 tablets po x 1 day, 2 tablets po x 1 day, 1 tablet po x 1 day, Disp: , Rfl:  .  rOPINIRole (REQUIP) 0.5 MG tablet, Take 0.5 mg by  mouth daily as needed., Disp: , Rfl:  .  triamcinolone ointment (KENALOG) 0.5 %, Apply 1 application topically 2 (two) times daily. If needed; too strong for face, underams, groin, Disp: 30 g, Rfl: 0 .  vitamin E 400 UNIT capsule, Take 400 Units by mouth daily. Reported on 02/25/2016, Disp: , Rfl:   Allergies  Allergen Reactions  . Codeine Other (See Comments)  . Hydrocodone   . Latex     Chart Review: I personally reviewed active problem list, medication list, allergies, family history, social history, health maintenance, notes from last encounter, lab results, imaging with the patient/caregiver today. Reviewed labs for the past several years, reviewed through care everywhere old neuro consult and dx, updated dx list/problem list  Review of Systems 10 Systems reviewed and are negative for acute change except as noted in the HPI.   Objective:    Virtual encounter, vitals limited, only able to obtain the following Today's Vitals   10/25/20 1055  Weight: 210 lb (95.3 kg)  Height: 5\' 3"  (1.6 m)   Body mass index is 37.2 kg/m. Nursing Note and Vital Signs reviewed.  Physical Exam Vitals and nursing note reviewed.  Neck:     Trachea: Phonation normal.  Neurological:     Mental Status: She is alert.  Psychiatric:        Mood and Affect: Mood normal.        Behavior: Behavior normal.     PE limited by telephone encounter  No results found for this or any previous visit (from the past 72 hour(s)).  Assessment and Plan:     ICD-10-CM   1. Involuntary jerky movements  R25.8 CBC with Differential/Platelet    Hemoglobin A1c    Vitamin B12    Iron, TIBC and Ferritin Panel    Magnesium    TSH   right arm - onset 3-4 months ago, not able to do any exam through telephone visit- sounds most like myoclonic jerk and less like tremor  2. Prediabetes  R73.03 COMPLETE METABOLIC PANEL WITH GFR    Hemoglobin A1c   prediabetic for several years, labs overdue  3. Restless legs   G25.81 CBC with Differential/Platelet    COMPLETE METABOLIC PANEL WITH GFR    Hemoglobin A1c    Vitamin B12    Iron, TIBC and Ferritin Panel   on meds, unclear if she had prior workup for iron deficiency or B12 defiency  4. Nausea and vomiting, intractability of vomiting not specified, unspecified vomiting type  R11.2 COMPLETE METABOLIC PANEL WITH GFR   with COVID a few months ago - improved, recheck labs  5. Other general symptoms and signs  R68.89 CBC with Differential/Platelet    COMPLETE METABOLIC PANEL WITH GFR  Hemoglobin A1c    Vitamin B12    Iron, TIBC and Ferritin Panel    Magnesium    VITAMIN D 25 Hydroxy (Vit-D Deficiency, Fractures)    TSH  6. Hyperlipidemia, unspecified hyperlipidemia type  E78.5 COMPLETE METABOLIC PANEL WITH GFR    Lipid panel   managed with diet/lifestyle, not on statin, due for labs  7. Vitamin D deficiency  E55.9 COMPLETE METABOLIC PANEL WITH GFR    VITAMIN D 25 Hydroxy (Vit-D Deficiency, Fractures)   low vit d several years ago - recheck  8. Tremor, unspecified  R25.1 CBC with Differential/Platelet    COMPLETE METABOLIC PANEL WITH GFR    Hemoglobin A1c    Vitamin B12    Iron, TIBC and Ferritin Panel    Magnesium    VITAMIN D 25 Hydroxy (Vit-D Deficiency, Fractures)    TSH   2015 head - evaluated by Dr. Sherryll BurgerShah neuro  9. Idiopathic peripheral neuropathy  G60.9 CBC with Differential/Platelet    COMPLETE METABOLIC PANEL WITH GFR    Hemoglobin A1c    Vitamin B12    Iron, TIBC and Ferritin Panel    Magnesium    VITAMIN D 25 Hydroxy (Vit-D Deficiency, Fractures)    TSH     - I discussed the assessment and treatment plan with the patient. The patient was provided an opportunity to ask questions and all were answered. The patient agreed with the plan and demonstrated an understanding of the instructions.  - The patient was advised to call back or seek an in-person evaluation if the symptoms worsen or if the condition fails to improve as  anticipated.  I provided 30+ minutes of non-face-to-face time during this encounter.  Danelle BerryLeisa Kimala Horne, PA-C 10/25/20 12:16 PM    Addendum:    ICD-10-CM   1. Involuntary jerky movements  R25.8 CBC with Differential/Platelet    Hemoglobin A1c    Vitamin B12    Iron, TIBC and Ferritin Panel    Magnesium    TSH    Ambulatory referral to Neurology   right arm - onset 3-4 months ago, not able to do any exam through telephone visit- sounds most like myoclonic jerk and less like tremor  2. Prediabetes  R73.03 COMPLETE METABOLIC PANEL WITH GFR    Hemoglobin A1c   prediabetic for several years, labs overdue  3. Restless legs  G25.81 CBC with Differential/Platelet    COMPLETE METABOLIC PANEL WITH GFR    Hemoglobin A1c    Vitamin B12    Iron, TIBC and Ferritin Panel   on meds, unclear if she had prior workup for iron deficiency or B12 defiency  4. Nausea and vomiting, intractability of vomiting not specified, unspecified vomiting type  R11.2 COMPLETE METABOLIC PANEL WITH GFR   with COVID a few months ago - improved, recheck labs  5. Other general symptoms and signs  R68.89 CBC with Differential/Platelet    COMPLETE METABOLIC PANEL WITH GFR    Hemoglobin A1c    Vitamin B12    Iron, TIBC and Ferritin Panel    Magnesium    VITAMIN D 25 Hydroxy (Vit-D Deficiency, Fractures)    TSH  6. Hyperlipidemia, unspecified hyperlipidemia type  E78.5 COMPLETE METABOLIC PANEL WITH GFR    Lipid panel   managed with diet/lifestyle, not on statin, due for labs  7. Vitamin D deficiency  E55.9 COMPLETE METABOLIC PANEL WITH GFR    VITAMIN D 25 Hydroxy (Vit-D Deficiency, Fractures)   low vit d several years ago - recheck  8. Tremor, unspecified  R25.1 CBC with Differential/Platelet    COMPLETE METABOLIC PANEL WITH GFR    Hemoglobin A1c    Vitamin B12    Iron, TIBC and Ferritin Panel    Magnesium    VITAMIN D 25 Hydroxy (Vit-D Deficiency, Fractures)    TSH    Ambulatory referral to Neurology   2015 head  - evaluated by Dr. Sherryll Burger neuro  9. Idiopathic peripheral neuropathy  G60.9 CBC with Differential/Platelet    COMPLETE METABOLIC PANEL WITH GFR    Hemoglobin A1c    Vitamin B12    Iron, TIBC and Ferritin Panel    Magnesium    VITAMIN D 25 Hydroxy (Vit-D Deficiency, Fractures)    TSH    Ambulatory referral to Neurology  10. Abnormal finding of blood chemistry, unspecified  R79.9 Iron, TIBC and Ferritin Panel  11. Arthralgia, unspecified joint  M25.50 Iron, TIBC and Ferritin Panel

## 2020-10-26 LAB — SURGICAL PATHOLOGY

## 2020-10-30 ENCOUNTER — Encounter: Payer: Self-pay | Admitting: Family Medicine

## 2020-10-30 LAB — HEMOGLOBIN A1C
Hgb A1c MFr Bld: 5.8 % of total Hgb — ABNORMAL HIGH (ref <5.7)
Mean Plasma Glucose: 120 mg/dL
eAG (mmol/L): 6.6 mmol/L

## 2020-10-30 LAB — COMPLETE METABOLIC PANEL WITH GFR
AG Ratio: 1.5 (calc) (ref 1.0–2.5)
ALT: 9 U/L (ref 6–29)
AST: 12 U/L (ref 10–35)
Albumin: 4.3 g/dL (ref 3.6–5.1)
Alkaline phosphatase (APISO): 63 U/L (ref 37–153)
BUN: 17 mg/dL (ref 7–25)
CO2: 30 mmol/L (ref 20–32)
Calcium: 9.4 mg/dL (ref 8.6–10.4)
Chloride: 100 mmol/L (ref 98–110)
Creat: 0.66 mg/dL (ref 0.50–0.99)
GFR, Est African American: 105 mL/min/{1.73_m2} (ref >=60)
GFR, Est Non African American: 91 mL/min/{1.73_m2} (ref >=60)
Globulin: 2.8 g/dL (calc) (ref 1.9–3.7)
Glucose, Bld: 88 mg/dL (ref 65–139)
Potassium: 4.6 mmol/L (ref 3.5–5.3)
Sodium: 139 mmol/L (ref 135–146)
Total Bilirubin: 0.4 mg/dL (ref 0.2–1.2)
Total Protein: 7.1 g/dL (ref 6.1–8.1)

## 2020-10-30 LAB — CBC WITH DIFFERENTIAL/PLATELET
Absolute Monocytes: 693 cells/uL (ref 200–950)
Basophils Absolute: 18 cells/uL (ref 0–200)
Basophils Relative: 0.2 %
Eosinophils Absolute: 9 cells/uL — ABNORMAL LOW (ref 15–500)
Eosinophils Relative: 0.1 %
HCT: 40.9 % (ref 35.0–45.0)
Hemoglobin: 13.7 g/dL (ref 11.7–15.5)
Lymphs Abs: 2745 cells/uL (ref 850–3900)
MCH: 30.3 pg (ref 27.0–33.0)
MCHC: 33.5 g/dL (ref 32.0–36.0)
MCV: 90.5 fL (ref 80.0–100.0)
MPV: 10.4 fL (ref 7.5–12.5)
Monocytes Relative: 7.7 %
Neutro Abs: 5535 cells/uL (ref 1500–7800)
Neutrophils Relative %: 61.5 %
Platelets: 248 10*3/uL (ref 140–400)
RBC: 4.52 10*6/uL (ref 3.80–5.10)
RDW: 13.5 % (ref 11.0–15.0)
Total Lymphocyte: 30.5 %
WBC: 9 10*3/uL (ref 3.8–10.8)

## 2020-10-30 LAB — IRON,TIBC AND FERRITIN PANEL
%SAT: 17 % (calc) (ref 16–45)
Ferritin: 20 ng/mL (ref 16–288)
Iron: 61 ug/dL (ref 45–160)
TIBC: 352 mcg/dL (calc) (ref 250–450)

## 2020-10-30 LAB — LIPID PANEL
Cholesterol: 176 mg/dL (ref <200)
HDL: 93 mg/dL (ref >=50)
LDL Cholesterol (Calc): 67 mg/dL (calc)
Non-HDL Cholesterol (Calc): 83 mg/dL (calc) (ref <130)
Total CHOL/HDL Ratio: 1.9 (calc) (ref <5.0)
Triglycerides: 81 mg/dL (ref <150)

## 2020-10-30 LAB — MAGNESIUM: Magnesium: 2.1 mg/dL (ref 1.5–2.5)

## 2020-10-30 LAB — TSH: TSH: 0.87 mIU/L (ref 0.40–4.50)

## 2020-10-30 LAB — VITAMIN D 25 HYDROXY (VIT D DEFICIENCY, FRACTURES): Vit D, 25-Hydroxy: 20 ng/mL — ABNORMAL LOW (ref 30–100)

## 2020-10-30 LAB — VITAMIN B12: Vitamin B-12: 382 pg/mL (ref 200–1100)

## 2020-10-31 ENCOUNTER — Telehealth: Payer: Medicare Other | Admitting: Family Medicine

## 2021-01-10 ENCOUNTER — Other Ambulatory Visit: Payer: Self-pay | Admitting: Family Medicine

## 2021-01-10 DIAGNOSIS — G894 Chronic pain syndrome: Secondary | ICD-10-CM

## 2021-04-25 ENCOUNTER — Other Ambulatory Visit: Payer: Self-pay | Admitting: Family Medicine

## 2021-04-25 NOTE — Telephone Encounter (Unsigned)
Copied from CRM [id number]. Topic: Quick Communication - Rx Refill/Question >> Apr 25, 2021  4:52 PM Marylen Ponto wrote: Medication: rOPINIRole (REQUIP) 0.5 MG tablet  Has the patient contacted their pharmacy? No. Pt does not have any refills remaining and requests Rx is sent to a new pharmacy (Agent: If no, request that the patient contact the pharmacy for the refill.) (Agent: If yes, when and what did the pharmacy advise?)  Preferred Pharmacy (with phone number or street name): [id number] [place], [place] - [address] AT [facility] OF [address] Phone: [phone]   Fax: [phone]  Agent: Please be advised that RX refills may take up to 3 business days. We ask that you follow-up with your pharmacy.

## 2021-04-25 NOTE — Telephone Encounter (Signed)
Requested medication (s) are due for refill today: historical med  Requested medication (s) are on the active medication list: yes  Last refill:  12/20/19  Future visit scheduled: no  Notes to clinic:  historical medication do you wart to renew Rx?     Requested Prescriptions  Pending Prescriptions Disp Refills   rOPINIRole (REQUIP) 0.5 MG tablet      Sig: Take 1 tablet (0.5 mg total) by mouth daily as needed.     Neurology:  Parkinsonian Agents Passed - 04/25/2021  5:06 PM      Passed - Last BP in normal range    BP Readings from Last 1 Encounters:  05/07/20 124/76          Passed - Valid encounter within last 12 months    Recent Outpatient Visits           6 months ago Involuntary jerky movements   [facility] Danelle Berry, PA-C   8 months ago COVID   [facility] Welford Roche D, MD   11 months ago Current moderate episode of major depressive disorder, unspecified whether recurrent [facility])   [facility] Danelle Berry, PA-C   1 year ago Current moderate episode of major depressive disorder, unspecified whether recurrent [facility])   [facility] Danelle Berry, PA-C   1 year ago Productive cough   [facility] [place], [place], [place]

## 2021-04-26 NOTE — Telephone Encounter (Signed)
Last seen 3.17.2022 no upcoming appt sch'd

## 2021-06-24 ENCOUNTER — Other Ambulatory Visit: Payer: Self-pay

## 2021-06-24 ENCOUNTER — Ambulatory Visit (INDEPENDENT_AMBULATORY_CARE_PROVIDER_SITE_OTHER): Payer: Medicare Other

## 2021-06-24 DIAGNOSIS — Z23 Encounter for immunization: Secondary | ICD-10-CM

## 2021-08-01 ENCOUNTER — Encounter: Payer: Self-pay | Admitting: Family Medicine

## 2021-10-30 ENCOUNTER — Encounter: Payer: Self-pay | Admitting: Internal Medicine

## 2021-10-30 ENCOUNTER — Ambulatory Visit (INDEPENDENT_AMBULATORY_CARE_PROVIDER_SITE_OTHER): Payer: Medicare Other | Admitting: Internal Medicine

## 2021-10-30 VITALS — BP 122/72 | HR 88 | Temp 98.1°F | Resp 16 | Ht 63.0 in | Wt 193.1 lb

## 2021-10-30 DIAGNOSIS — G894 Chronic pain syndrome: Secondary | ICD-10-CM | POA: Diagnosis not present

## 2021-10-30 DIAGNOSIS — Z13220 Encounter for screening for lipoid disorders: Secondary | ICD-10-CM

## 2021-10-30 DIAGNOSIS — Z23 Encounter for immunization: Secondary | ICD-10-CM

## 2021-10-30 DIAGNOSIS — E559 Vitamin D deficiency, unspecified: Secondary | ICD-10-CM

## 2021-10-30 DIAGNOSIS — G609 Hereditary and idiopathic neuropathy, unspecified: Secondary | ICD-10-CM

## 2021-10-30 DIAGNOSIS — Z1231 Encounter for screening mammogram for malignant neoplasm of breast: Secondary | ICD-10-CM | POA: Diagnosis not present

## 2021-10-30 DIAGNOSIS — F321 Major depressive disorder, single episode, moderate: Secondary | ICD-10-CM

## 2021-10-30 DIAGNOSIS — R7303 Prediabetes: Secondary | ICD-10-CM

## 2021-10-30 MED ORDER — DULOXETINE HCL 30 MG PO CPEP: 30.0000 mg | ORAL_CAPSULE | Freq: Two times a day (BID) | ORAL | 3 refills | Status: DC

## 2021-10-30 NOTE — Assessment & Plan Note (Signed)
Refilled Cymbalta, discussed decreasing dose especially with Gabapentin from pain management but patient resistant, says current regimen controlling pain.  ?

## 2021-10-30 NOTE — Assessment & Plan Note (Signed)
Recheck A1c today, diet controlled.  ?

## 2021-10-30 NOTE — Patient Instructions (Signed)
It was great seeing you today! ? ?Plan discussed at today's visit: ?-Blood work ordered today, results will be uploaded to MyChart.  ?-Cymbalta refilled ?-Mammogram ordered  ?-Pneumonia vaccine  ? ?Follow up in: 6 months  ? ?Take care and let us know if you have any questions or concerns prior to your next visit. ? ?Dr. Caralee Ates ? ?

## 2021-10-30 NOTE — Assessment & Plan Note (Signed)
Stable on Cymbalta

## 2021-10-30 NOTE — Progress Notes (Signed)
? ?Established Patient Office Visit ? ?Subjective:  ?Patient ID: Christina Ware, female    DOB: 05/24/1951  Age: 71 y.o. MRN: [id number] ? ?CC:  ?Chief Complaint  ?Patient presents with  ? Follow-up  ?  Medication   ? ? ?HPI ?Christina Ware presents for follow up on chronic medical conditions. Larey SeatFell out of bed 2 weeks ago, having some rib pain, no breathing issues.  ? ?Chronic Back Pain: ?-Currently on Cymbalta 30 BID, follows with pain management also on Gabapentin at a high dose ? ?Pre-Diabetes: ?-Last A1c 3/22 5.6% ?-Not currently on any medications, trying to manage with lifestyle  ? ?Allergic Rhinitis: ?-Currently on Zyrtec, Flonase, symptoms well controlled ? ?Vitamin D Deficiency: ?-Last Vitamin D low at 20 in 3/22 ?-Taking OTC Vitamin D supplement taking 400 mg with calcium  ? ?Health Maintenance: ?-Blood work due ?-Mammogram due ?-Pneumonia vaccine due ? ?Past Medical History:  ?Diagnosis Date  ? Chronic back pain greater than 3 months duration   ? Degenerative Disc Disease, surgery on lower back.  ? Chronic neck pain   ? Ruptured disc in neck  ? Prediabetes   ? Spinal stenosis   ? ? ?Past Surgical History:  ?Procedure Laterality Date  ? BACK SURGERY  2005  ? lower back  ? BREAST BIOPSY Right 10/25/2020  ? affirm bx, coil marker, path pending  ? FOOT SURGERY Bilateral   ? twice  ? KNEE ARTHROSCOPY Right   ? ? ?Family History  ?Problem Relation Age of Onset  ? Diabetes Mother   ? Cancer Mother   ? Hypertension Mother   ? Diabetes Father   ? Cancer Father   ? Hypertension Father   ? Diabetes Sister   ? Hypertension Sister   ? Diabetes Brother   ? Cancer Brother   ? Hypertension Brother   ? Diabetes Brother   ? Birth defects Brother   ?     born with a hole in his heart  ? Lung disease Brother   ?     patient needed a lung transplant  ? Heart disease Brother   ?     needed a heart transplant  ? Breast cancer Neg Hx   ? ? ?Social History  ? ?Socioeconomic History  ? Marital status: Divorced  ?  Spouse  name: Not on file  ? Number of children: 0  ? Years of education: Not on file  ? Highest education level: Not on file  ?Occupational History  ? Occupation: Retired  ?Tobacco Use  ? Smoking status: Former  ? Smokeless tobacco: Never  ? Tobacco comments:  ?  quit when she was [age]yrs old  ?Vaping Use  ? Vaping Use: Never used  ?Substance and Sexual Activity  ? Alcohol use: No  ?  Alcohol/week: 0.0 standard drinks  ? Drug use: No  ? Sexual activity: Not Currently  ?Other Topics Concern  ? Not on file  ?Social History Narrative  ? Not on file  ? ?Social Determinants of Health  ? ?Financial Resource Strain: Not on file  ?Food Insecurity: Not on file  ?Transportation Needs: Not on file  ?Physical Activity: Not on file  ?Stress: Not on file  ?Social Connections: Not on file  ?Intimate Partner Violence: Not on file  ? ? ?Outpatient Medications Prior to Visit  ?Medication Sig Dispense Refill  ? Black Cohosh 540 MG CAPS Take 1 capsule by mouth daily.     ? Cetirizine HCl (ZYRTEC ALLERGY) 10 MG  TBDP Take 10 mg by mouth at bedtime. 30 tablet 3  ? docusate sodium (COLACE) 100 MG capsule Take 200 mg by mouth daily as needed.     ? DULoxetine (CYMBALTA) 30 MG capsule TAKE ONE CAPSULE BY MOUTH TWICE DAILY 180 capsule 3  ? fentaNYL (DURAGESIC) 50 MCG/HR APPLY 1 PATCH TOPICALLY TO THE SKIN EVERY 72 HOURS    ? fluticasone (FLONASE) 50 MCG/ACT nasal spray Place 2 sprays into both nostrils daily. 16 g 2  ? gabapentin (NEURONTIN) 800 MG tablet Take 800 mg by mouth 2 (two) times daily as needed.     ? ketoconazole (NIZORAL) 2 % cream Apply 1 application topically daily. 15 g 0  ? L-Arginine 500 MG CAPS Take 1 capsule by mouth daily.     ? Lecithin 1200 MG CAPS Take by mouth.    ? naloxone (NARCAN) nasal spray 4 mg/0.1 mL Narcan 4 mg/actuation nasal spray ? Take by nasal route every 3 minutes until patient awakes or EMS arrives.    ? ondansetron (ZOFRAN) 4 MG tablet Take 1 tablet (4 mg total) by mouth every 8 (eight) hours as needed for  nausea or vomiting. 12 tablet 1  ? Oxycodone HCl 10 MG TABS oxycodone 10 mg tablet ? Take 1 tablet 4 times a day by oral route as needed.    ? predniSONE (DELTASONE) 10 MG tablet prednisone 10 mg tablet ? Take 6 tablets po x1 day, 5 tablets po x 1 day, 4 tablets po x 1 day, 3 tablets po x 1 day, 2 tablets po x 1 day, 1 tablet po x 1 day    ? rOPINIRole (REQUIP) 0.5 MG tablet Take 0.5 mg by mouth daily as needed.    ? triamcinolone ointment (KENALOG) 0.5 % Apply 1 application topically 2 (two) times daily. If needed; too strong for face, underams, groin 30 g 0  ? vitamin E 400 UNIT capsule Take 400 Units by mouth daily. Reported on 02/25/2016    ? ?No facility-administered medications prior to visit.  ? ? ?Allergies  ?Allergen Reactions  ? Codeine Other (See Comments)  ? Hydrocodone   ? Latex   ? ? ?ROS ?Review of Systems  ?Constitutional:  Negative for chills and fever.  ?Respiratory:  Negative for cough and shortness of breath.   ?Cardiovascular:  Negative for chest pain.  ?Gastrointestinal:  Negative for abdominal pain.  ?Musculoskeletal:  Positive for back pain.  ? ?  ?Objective:  ?  ?Physical Exam ?Constitutional:   ?   Appearance: Normal appearance.  ?HENT:  ?   Head: Normocephalic and atraumatic.  ?Eyes:  ?   Conjunctiva/sclera: Conjunctivae normal.  ?Cardiovascular:  ?   Rate and Rhythm: Normal rate and regular rhythm.  ?Pulmonary:  ?   Effort: Pulmonary effort is normal.  ?   Breath sounds: Normal breath sounds.  ?Musculoskeletal:  ?   Right lower leg: No edema.  ?   Left lower leg: No edema.  ?Skin: ?   General: Skin is warm and dry.  ?Neurological:  ?   General: No focal deficit present.  ?   Mental Status: She is alert. Mental status is at baseline.  ?Psychiatric:     ?   Mood and Affect: Mood normal.     ?   Behavior: Behavior normal.  ? ? ?BP 122/72 (BP Location: Right Arm, Patient Position: Sitting, Cuff Size: Normal)   Pulse 88   Temp 98.1 ?F (36.7 ?C) (Oral)   Resp 16  Ht 5\' 3"  (1.6 m) Comment:  per patient  Wt 193 lb 1.6 oz (87.6 kg)   SpO2 93%   BMI 34.21 kg/m?  ?Wt Readings from Last 3 Encounters:  ?10/25/20 210 lb (95.3 kg)  ?05/07/20 204 lb 8 oz (92.8 kg)  ?01/16/20 214 lb 12.8 oz (97.4 kg)  ? ? ? ?Health Maintenance Due  ?Topic Date Due  ? Pneumonia Vaccine 103+ Years old (2 - PPSV23 if available, else PCV20) 04/07/2018  ? COVID-19 Vaccine (3 - Booster for Moderna series) 12/31/2019  ? MAMMOGRAM  10/19/2021  ? ? ?There are no preventive care reminders to display for this patient. ? ?Lab Results  ?Component Value Date  ? TSH 0.87 10/29/2020  ? ?Lab Results  ?Component Value Date  ? WBC 9.0 10/29/2020  ? HGB 13.7 10/29/2020  ? HCT 40.9 10/29/2020  ? MCV 90.5 10/29/2020  ? PLT 248 10/29/2020  ? ?Lab Results  ?Component Value Date  ? NA 139 10/29/2020  ? K 4.6 10/29/2020  ? CO2 30 10/29/2020  ? GLUCOSE 88 10/29/2020  ? BUN 17 10/29/2020  ? CREATININE 0.66 10/29/2020  ? BILITOT 0.4 10/29/2020  ? ALKPHOS 63 10/17/2016  ? AST 12 10/29/2020  ? ALT 9 10/29/2020  ? PROT 7.1 10/29/2020  ? ALBUMIN 4.2 10/17/2016  ? CALCIUM 9.4 10/29/2020  ? ANIONGAP 9 10/17/2016  ? ?Lab Results  ?Component Value Date  ? CHOL 176 10/29/2020  ? ?Lab Results  ?Component Value Date  ? HDL 93 10/29/2020  ? ?Lab Results  ?Component Value Date  ? LDLCALC 67 10/29/2020  ? ?Lab Results  ?Component Value Date  ? TRIG 81 10/29/2020  ? ?Lab Results  ?Component Value Date  ? CHOLHDL 1.9 10/29/2020  ? ?Lab Results  ?Component Value Date  ? HGBA1C 5.8 (H) 10/29/2020  ? ? ?  ?Assessment & Plan:  ? ?Problem List Items Addressed This Visit   ? ?  ? Nervous and Auditory  ? Idiopathic peripheral neuropathy  ?  Refilled Cymbalta, discussed decreasing dose especially with Gabapentin from pain management but patient resistant, says current regimen controlling pain.  ?  ?  ? Relevant Medications  ? DULoxetine (CYMBALTA) 30 MG capsule  ?  ? Other  ? Prediabetes  ?  Recheck A1c today, diet controlled.  ?  ?  ? Relevant Orders  ? CBC w/Diff/Platelet  ?  COMPLETE METABOLIC PANEL WITH GFR  ? Lipid Profile  ? HgB A1c  ? Chronic pain syndrome - Primary  ? Relevant Medications  ? DULoxetine (CYMBALTA) 30 MG capsule  ? Current moderate episode of major depressive disorde

## 2021-10-31 LAB — COMPLETE METABOLIC PANEL WITH GFR
AG Ratio: 1.5 (calc) (ref 1.0–2.5)
ALT: 8 U/L (ref 6–29)
AST: 14 U/L (ref 10–35)
Albumin: 4 g/dL (ref 3.6–5.1)
Alkaline phosphatase (APISO): 64 U/L (ref 37–153)
BUN: 13 mg/dL (ref 7–25)
CO2: 30 mmol/L (ref 20–32)
Calcium: 9 mg/dL (ref 8.6–10.4)
Chloride: 104 mmol/L (ref 98–110)
Creat: 0.65 mg/dL (ref 0.50–1.05)
Globulin: 2.7 g/dL (calc) (ref 1.9–3.7)
Glucose, Bld: 102 mg/dL — ABNORMAL HIGH (ref 65–99)
Potassium: 4.5 mmol/L (ref 3.5–5.3)
Sodium: 141 mmol/L (ref 135–146)
Total Bilirubin: 0.3 mg/dL (ref 0.2–1.2)
Total Protein: 6.7 g/dL (ref 6.1–8.1)
eGFR: 95 mL/min/{1.73_m2} (ref >=60)

## 2021-10-31 LAB — CBC WITH DIFFERENTIAL/PLATELET
Absolute Monocytes: 511 cells/uL (ref 200–950)
Basophils Absolute: 28 cells/uL (ref 0–200)
Basophils Relative: 0.4 %
Eosinophils Absolute: 297 cells/uL (ref 15–500)
Eosinophils Relative: 4.3 %
HCT: 40.5 % (ref 35.0–45.0)
Hemoglobin: 13.6 g/dL (ref 11.7–15.5)
Lymphs Abs: 2318 cells/uL (ref 850–3900)
MCH: 31.3 pg (ref 27.0–33.0)
MCHC: 33.6 g/dL (ref 32.0–36.0)
MCV: 93.3 fL (ref 80.0–100.0)
MPV: 10.5 fL (ref 7.5–12.5)
Monocytes Relative: 7.4 %
Neutro Abs: 3747 cells/uL (ref 1500–7800)
Neutrophils Relative %: 54.3 %
Platelets: 255 10*3/uL (ref 140–400)
RBC: 4.34 10*6/uL (ref 3.80–5.10)
RDW: 12.8 % (ref 11.0–15.0)
Total Lymphocyte: 33.6 %
WBC: 6.9 10*3/uL (ref 3.8–10.8)

## 2021-10-31 LAB — LIPID PANEL
Cholesterol: 168 mg/dL (ref <200)
HDL: 71 mg/dL (ref >=50)
LDL Cholesterol (Calc): 80 mg/dL (calc)
Non-HDL Cholesterol (Calc): 97 mg/dL (calc) (ref <130)
Total CHOL/HDL Ratio: 2.4 (calc) (ref <5.0)
Triglycerides: 90 mg/dL (ref <150)

## 2021-10-31 LAB — HEMOGLOBIN A1C
Hgb A1c MFr Bld: 5.8 % of total Hgb — ABNORMAL HIGH (ref <5.7)
Mean Plasma Glucose: 120 mg/dL
eAG (mmol/L): 6.6 mmol/L

## 2021-10-31 LAB — VITAMIN D 25 HYDROXY (VIT D DEFICIENCY, FRACTURES): Vit D, 25-Hydroxy: 24 ng/mL — ABNORMAL LOW (ref 30–100)

## 2021-10-31 MED ORDER — VITAMIN D (ERGOCALCIFEROL) 1.25 MG (50000 UNIT) PO CAPS: 50000.0000 [IU] | ORAL_CAPSULE | ORAL | 0 refills | Status: DC

## 2021-10-31 NOTE — Addendum Note (Signed)
Addended by: Teodora Medici on: 10/31/2021 09:29 AM ? ? Modules accepted: Orders ? ?

## 2022-02-08 ENCOUNTER — Other Ambulatory Visit: Payer: Self-pay | Admitting: Internal Medicine

## 2022-02-08 DIAGNOSIS — E559 Vitamin D deficiency, unspecified: Secondary | ICD-10-CM

## 2022-02-10 NOTE — Telephone Encounter (Signed)
Requested medications are due for refill today.  yes  Requested medications are on the active medications list.  yes  Last refill. 10/31/2021 #12 0 refills  Future visit scheduled.   yes  Notes to clinic.  Medication not delegated.    Requested Prescriptions  Pending Prescriptions Disp Refills   Vitamin D, Ergocalciferol, (DRISDOL) 1.25 MG (50000 UNIT) CAPS capsule [Pharmacy Med Name: VITAMIN D2 50,000IU (ERGO) CAP RX] 12 capsule 0    Sig: TAKE 1 CAPSULE BY MOUTH EVERY 7 DAYS     Endocrinology:  Vitamins - Vitamin D Supplementation 2 Failed - 02/08/2022  1:31 PM      Failed - Manual Review: Route requests for 50,000 IU strength to the provider      Failed - Vitamin D in normal range and within 360 days    Vit D, 25-Hydroxy  Date Value Ref Range Status  10/30/2021 24 (L) 30 - 100 ng/mL Final    Comment:    Vitamin D Status         25-OH Vitamin D: . Deficiency:                    <20 ng/mL Insufficiency:             20 - 29 ng/mL Optimal:                 > or = 30 ng/mL . For 25-OH Vitamin D testing on patients on  D2-supplementation and patients for whom quantitation  of D2 and D3 fractions is required, the [facility](TM) 25-OH VIT D, (D2,D3), LC/MS/MS is recommended: order  code 95638 (patients >62yrs). See Note 1 . Note 1 . For additional information, please refer to  http://education.[facility].com/faq/FAQ199  (This link is being provided for informational/ educational purposes only.)          Passed - Ca in normal range and within 360 days    Calcium  Date Value Ref Range Status  10/30/2021 9.0 8.6 - 10.4 mg/dL Final   Calcium, Total  Date Value Ref Range Status  05/15/2012 8.2 (L) 8.5 - 10.1 mg/dL Final         Passed - Valid encounter within last 12 months    Recent Outpatient Visits           3 months ago Chronic pain syndrome   [facility] Margarita Mail, DO   1 year ago Involuntary jerky movements   [facility] Danelle Berry, PA-C   1 year ago COVID   [facility] Welford Roche D, MD   1 year ago Current moderate episode of major depressive disorder, unspecified whether recurrent [facility])   [facility] Danelle Berry, PA-C   2 years ago Current moderate episode of major depressive disorder, unspecified whether recurrent [facility])   [facility] Danelle Berry, PA-C       Future Appointments             In 2 months Danelle Berry, PA-C [facility], [facility]

## 2022-05-05 ENCOUNTER — Ambulatory Visit: Payer: Medicare Other | Admitting: Family Medicine

## 2022-05-21 ENCOUNTER — Ambulatory Visit (INDEPENDENT_AMBULATORY_CARE_PROVIDER_SITE_OTHER): Payer: Medicare Other | Admitting: Family Medicine

## 2022-05-21 ENCOUNTER — Encounter: Payer: Self-pay | Admitting: Family Medicine

## 2022-05-21 VITALS — BP 106/68 | HR 82 | Temp 97.9°F | Resp 16 | Ht 63.0 in | Wt 185.2 lb

## 2022-05-21 DIAGNOSIS — R7303 Prediabetes: Secondary | ICD-10-CM

## 2022-05-21 DIAGNOSIS — Z23 Encounter for immunization: Secondary | ICD-10-CM

## 2022-05-21 DIAGNOSIS — F325 Major depressive disorder, single episode, in full remission: Secondary | ICD-10-CM | POA: Diagnosis not present

## 2022-05-21 DIAGNOSIS — F321 Major depressive disorder, single episode, moderate: Secondary | ICD-10-CM

## 2022-05-21 DIAGNOSIS — G894 Chronic pain syndrome: Secondary | ICD-10-CM

## 2022-05-21 DIAGNOSIS — Z1231 Encounter for screening mammogram for malignant neoplasm of breast: Secondary | ICD-10-CM

## 2022-05-21 DIAGNOSIS — Z780 Asymptomatic menopausal state: Secondary | ICD-10-CM

## 2022-05-21 DIAGNOSIS — E785 Hyperlipidemia, unspecified: Secondary | ICD-10-CM

## 2022-05-21 DIAGNOSIS — G609 Hereditary and idiopathic neuropathy, unspecified: Secondary | ICD-10-CM

## 2022-05-21 DIAGNOSIS — E559 Vitamin D deficiency, unspecified: Secondary | ICD-10-CM

## 2022-05-21 DIAGNOSIS — J309 Allergic rhinitis, unspecified: Secondary | ICD-10-CM

## 2022-05-21 MED ORDER — DULOXETINE HCL 30 MG PO CPEP: 30.0000 mg | ORAL_CAPSULE | Freq: Two times a day (BID) | ORAL | 3 refills | Status: DC

## 2022-05-21 NOTE — Progress Notes (Signed)
Name: Christina Ware   MRN: [id number]    DOB: [birth year]   Date:05/21/2022       Progress Note  Chief Complaint  Patient presents with   Follow-up   Depression   Pre DM     Subjective:   Christina Ware is a 71 y.o. female, presents to clinic for cronic medical condition f/up  Chronic pain syndrome - managed by specialists Is on cymbalta 30 mg BID, gabapentin, fentanyl patches and oxycodone per pain management  Prediabetes: Lab Results  Component Value Date   HGBA1C 5.8 (H) 10/30/2021    Allergic rhinitis on zyrtec, flonase - sx stable  Vit D Deficiency: Last vitamin D Lab Results  Component Value Date   VD25OH 24 (L) 10/30/2021   MDD?  on cymbalta for pain, no other depressive sx or concerns at this time PHQ neg, but she also reports it was also started for her "nerves" ie anxiety     05/21/2022    1:30 PM 10/30/2021   10:18 AM 10/25/2020   10:54 AM  Depression screen PHQ 2/9  Decreased Interest 0 0 1  Down, Depressed, Hopeless 0 0 1  PHQ - 2 Score 0 0 2  Altered sleeping 0 0 0  Tired, decreased energy 0 0 1  Change in appetite 0 0 0  Feeling bad or failure about yourself  0 0 0  Trouble concentrating 0 0 0  Moving slowly or fidgety/restless 0 0 0  Suicidal thoughts 0 0 0  PHQ-9 Score 0 0 3  Difficult doing work/chores Not difficult at all  Not difficult at all   Hx of HLD not on meds and no cardiac hx, lipids recently done and well controlled, no meds started Lab Results  Component Value Date   CHOL 168 10/30/2021   HDL 71 10/30/2021   LDLCALC 80 10/30/2021   TRIG 90 10/30/2021   CHOLHDL 2.4 10/30/2021      HM: Health Maintenance  Topic Date Due   DEXA scan (bone density measurement)  10/01/2018   Mammogram  10/19/2021   Flu Shot  03/11/2022   COVID-19 Vaccine (3 - Moderna series) 06/06/2022*   Tetanus Vaccine  06/02/2026*   Cologuard (Stool DNA test)  01/24/2023   Pneumonia Vaccine  Completed   Hepatitis C Screening: USPSTF  Recommendation to screen - Ages 56-79 yo.  Completed   Zoster (Shingles) Vaccine  Completed   HPV Vaccine  Aged Out  *Topic was postponed. The date shown is not the original due date.   Mammo due - ordered today Osteopenia - last Dexa done 2018    Current Outpatient Medications:    Black Cohosh 540 MG CAPS, Take 1 capsule by mouth daily. , Disp: , Rfl:    Cetirizine HCl (ZYRTEC ALLERGY) 10 MG TBDP, Take 10 mg by mouth at bedtime., Disp: 30 tablet, Rfl: 3   docusate sodium (COLACE) 100 MG capsule, Take 200 mg by mouth daily as needed. , Disp: , Rfl:    DULoxetine (CYMBALTA) 30 MG capsule, Take 1 capsule (30 mg total) by mouth 2 (two) times daily., Disp: 180 capsule, Rfl: 3   fentaNYL (DURAGESIC) 50 MCG/HR, APPLY 1 PATCH TOPICALLY TO THE SKIN EVERY 72 HOURS, Disp: , Rfl:    fluticasone (FLONASE) 50 MCG/ACT nasal spray, Place 2 sprays into both nostrils daily., Disp: 16 g, Rfl: 2   gabapentin (NEURONTIN) 800 MG tablet, Take 800 mg by mouth 2 (two) times daily as needed. , Disp: ,  Rfl:    L-Arginine 500 MG CAPS, Take 1 capsule by mouth daily. , Disp: , Rfl:    Oxycodone HCl 10 MG TABS, oxycodone 10 mg tablet  Take 1 tablet 4 times a day by oral route as needed., Disp: , Rfl:    rOPINIRole (REQUIP) 0.5 MG tablet, Take 0.5 mg by mouth daily as needed., Disp: , Rfl:    SYSTANE ULTRA 0.4-0.3 % SOLN, Apply 1 drop to eye 3 (three) times daily., Disp: , Rfl:    Vitamin D, Ergocalciferol, (DRISDOL) 1.25 MG (50000 UNIT) CAPS capsule, TAKE 1 CAPSULE BY MOUTH EVERY 7 DAYS, Disp: 12 capsule, Rfl: 0   brimonidine (ALPHAGAN) 0.2 % ophthalmic solution, Place 1 drop into the left eye 3 (three) times daily. (Patient not taking: Reported on 05/21/2022), Disp: , Rfl:    neomycin-polymyxin b-dexamethasone (MAXITROL) 3.5-10000-0.1 SUSP, SMARTSIG:In Eye(s) (Patient not taking: Reported on 05/21/2022), Disp: , Rfl:    ondansetron (ZOFRAN) 4 MG tablet, Take 1 tablet (4 mg total) by mouth every 8 (eight) hours as needed  for nausea or vomiting. (Patient not taking: Reported on 05/21/2022), Disp: 12 tablet, Rfl: 1   vitamin E 400 UNIT capsule, Take 400 Units by mouth daily. Reported on 02/25/2016 (Patient not taking: Reported on 05/21/2022), Disp: , Rfl:   Patient Active Problem List   Diagnosis Date Noted   Current moderate episode of major depressive disorder, unspecified whether recurrent ([place]) 10/30/2021   Idiopathic peripheral neuropathy 10/25/2020   Tremor, unspecified 10/25/2020   COVID 08/22/2020   DDD (degenerative disc disease), cervical 02/08/2018   BMI 37.0-37.9, adult 12/22/2017   Long-term current use of opiate analgesic 09/01/2017   Chronic low back pain 09/01/2017   Chronic pain syndrome 09/01/2017   Lumbar post-laminectomy syndrome 09/01/2017   Chronic nasal congestion 04/07/2017   Warts 07/08/2016   Obesity (BMI 35.0-39.9 without comorbidity) 07/08/2016   Prediabetes 07/26/2015    Past Surgical History:  Procedure Laterality Date   BACK SURGERY  2005   lower back   BREAST BIOPSY Right 10/25/2020   affirm bx, coil marker, path pending   FOOT SURGERY Bilateral    twice   KNEE ARTHROSCOPY Right     Family History  Problem Relation Age of Onset   Diabetes Mother    Cancer Mother    Hypertension Mother    Diabetes Father    Cancer Father    Hypertension Father    Diabetes Sister    Hypertension Sister    Diabetes Brother    Cancer Brother    Hypertension Brother    Diabetes Brother    Birth defects Brother        born with a hole in his heart   Lung disease Brother        patient needed a lung transplant   Heart disease Brother        needed a heart transplant   Breast cancer Neg Hx     Social History   Tobacco Use   Smoking status: Former   Smokeless tobacco: Never   Tobacco comments:    quit when she was 71yrs old  Electronics engineer Use   Vaping Use: Never used  Substance Use Topics   Alcohol use: No    Alcohol/week: 0.0 standard drinks of alcohol   Drug use: No      Allergies  Allergen Reactions   Codeine Other (See Comments)   Hydrocodone    Latex     Health Maintenance  Topic Date Due  COVID-19 Vaccine (3 - Moderna series) 12/31/2019   MAMMOGRAM  10/19/2021   INFLUENZA VACCINE  03/11/2022   TETANUS/TDAP  06/02/2026 (Originally 02/16/1970)   Fecal DNA (Cologuard)  01/24/2023   Pneumonia Vaccine 53+ Years old  Completed   DEXA SCAN  Completed   Hepatitis C Screening  Completed   Zoster Vaccines- Shingrix  Completed   HPV VACCINES  Aged Out    Chart Review Today: I personally reviewed active problem list, medication list, allergies, family history, social history, health maintenance, notes from last encounter, lab results, imaging with the patient/caregiver today.   Review of Systems  Constitutional: Negative.   HENT: Negative.    Eyes: Negative.   Respiratory: Negative.    Cardiovascular: Negative.   Gastrointestinal: Negative.   Endocrine: Negative.   Genitourinary: Negative.   Musculoskeletal: Negative.   Skin: Negative.   Allergic/Immunologic: Negative.   Neurological: Negative.   Hematological: Negative.   Psychiatric/Behavioral: Negative.    All other systems reviewed and are negative.    Objective:   Vitals:   05/21/22 1331  BP: 106/68  Pulse: 82  Resp: 16  Temp: 97.9 F (36.6 C)  TempSrc: Oral  SpO2: 95%  Weight: 185 lb 3.2 oz (84 kg)  Height: 5\' 3"  (1.6 m)    Body mass index is 32.81 kg/m.  Physical Exam Vitals and nursing note reviewed.  Constitutional:      General: She is not in acute distress.    Appearance: Normal appearance. She is well-developed. She is not ill-appearing, toxic-appearing or diaphoretic.     Interventions: Face mask in place.  HENT:     Head: Normocephalic and atraumatic.     Right Ear: External ear normal.     Left Ear: External ear normal.  Eyes:     General: Lids are normal. No scleral icterus.       Right eye: No discharge.        Left eye: No discharge.      Conjunctiva/sclera: Conjunctivae normal.  Neck:     Trachea: Phonation normal. No tracheal deviation.  Cardiovascular:     Rate and Rhythm: Normal rate and regular rhythm.     Pulses: Normal pulses.          Radial pulses are 2+ on the right side and 2+ on the left side.       Posterior tibial pulses are 2+ on the right side and 2+ on the left side.     Heart sounds: Normal heart sounds. No murmur heard.    No friction rub. No gallop.  Pulmonary:     Effort: Pulmonary effort is normal. No respiratory distress.     Breath sounds: Normal breath sounds. No stridor. No wheezing, rhonchi or rales.  Chest:     Chest wall: No tenderness.  Abdominal:     General: Bowel sounds are normal. There is no distension.     Palpations: Abdomen is soft.  Musculoskeletal:     Right lower leg: No edema.     Left lower leg: No edema.     Comments: Left knee brace  Skin:    General: Skin is warm and dry.     Coloration: Skin is not jaundiced or pale.     Findings: No rash.  Neurological:     Mental Status: She is alert.     Motor: No abnormal muscle tone.     Gait: Gait abnormal (using walker).  Psychiatric:        Mood  and Affect: Mood normal.        Speech: Speech normal.        Behavior: Behavior normal.         Assessment & Plan:   Problem List Items Addressed This Visit       Nervous and Auditory   Idiopathic peripheral neuropathy    Gabapentin per pain specialists - see also Chronic pain      Relevant Medications   DULoxetine (CYMBALTA) 30 MG capsule     Other   Prediabetes    Lab Results  Component Value Date   HGBA1C 5.8 (H) 10/30/2021  Labs recently checked and still in prediabetic range, has been stable and controlled with diet/lifestyle Labs can be done at next appt or annually        Relevant Orders   COMPLETE METABOLIC PANEL WITH GFR (Completed)   Chronic pain syndrome    Per specialists - fentanyl, gabapentin, oxy cymbalta for MDD/anxiety but likely also  helping to neuropathy and chronic pain      Relevant Medications   DULoxetine (CYMBALTA) 30 MG capsule   Current moderate episode of major depressive disorder, unspecified whether recurrent ([facility]) - Primary    Sx stable on cymbalta - used for "nerves" aka anxiety sx and mood phq 9 reviewed and negative today, meds refilled    05/21/2022    1:30 PM 10/30/2021   10:18 AM 10/25/2020   10:54 AM  Depression screen PHQ 2/9  Decreased Interest 0 0 1  Down, Depressed, Hopeless 0 0 1  PHQ - 2 Score 0 0 2  Altered sleeping 0 0 0  Tired, decreased energy 0 0 1  Change in appetite 0 0 0  Feeling bad or failure about yourself  0 0 0  Trouble concentrating 0 0 0  Moving slowly or fidgety/restless 0 0 0  Suicidal thoughts 0 0 0  PHQ-9 Score 0 0 3  Difficult doing work/chores Not difficult at all  Not difficult at all         Relevant Medications   DULoxetine (CYMBALTA) 30 MG capsule   Other Visit Diagnoses     Need for influenza vaccination       Relevant Orders   Flu Vaccine QUAD High Dose(Fluad) (Completed)   Encounter for screening mammogram for malignant neoplasm of breast       Relevant Orders   MM 3D SCREEN BREAST BILATERAL   Need for tetanus, diphtheria, and acellular pertussis (Tdap) vaccine       not covered by insurance, discussed need for booster and indications on when to get if injury (laceration or puncture wound)   Postmenopausal estrogen deficiency       recommend pt do another dexa   Relevant Orders   DG Bone Density   Vitamin D deficiency       low with last labs, given Rx, recheck and adjust OTC Vit D 3 dose   Relevant Orders   COMPLETE METABOLIC PANEL WITH GFR (Completed)   VITAMIN D 25 Hydroxy (Vit-D Deficiency, Fractures) (Completed)   Hyperlipidemia, unspecified hyperlipidemia type       not requiring statins, continue diet and lifestyle efforts   Relevant Orders   COMPLETE METABOLIC PANEL WITH GFR (Completed)   Allergic rhinitis, unspecified seasonality,  unspecified trigger       stable sx on antihistamines and steroid nasal sprays        No follow-ups on file.   Danelle Berry, PA-C 05/21/22 1:30 PM

## 2022-05-21 NOTE — Patient Instructions (Signed)
[facility] [address], [place], [place] [address] Scheduling phone #: [phone]  Call to get your mammogram and bone density scheduled  Please sent [place] a mychart message when you need something if you cannot get a response or get through phones.  Health Maintenance  Topic Date Due   DEXA scan (bone density measurement)  10/01/2018   Mammogram  10/19/2021   COVID-19 Vaccine (3 - Moderna series) 06/06/2022*   Tetanus Vaccine  06/02/2026*   Cologuard (Stool DNA test)  01/24/2023   Pneumonia Vaccine  Completed   Flu Shot  Completed   Hepatitis C Screening: USPSTF Recommendation to screen - Ages 18-79 yo.  Completed   Zoster (Shingles) Vaccine  Completed   HPV Vaccine  Aged Out  *Topic was postponed. The date shown is not the original due date.

## 2022-05-22 LAB — COMPLETE METABOLIC PANEL WITH GFR
AG Ratio: 1.4 (calc) (ref 1.0–2.5)
ALT: 10 U/L (ref 6–29)
AST: 15 U/L (ref 10–35)
Albumin: 4.1 g/dL (ref 3.6–5.1)
Alkaline phosphatase (APISO): 74 U/L (ref 37–153)
BUN/Creatinine Ratio: 24 (calc) — ABNORMAL HIGH (ref 6–22)
BUN: 14 mg/dL (ref 7–25)
CO2: 30 mmol/L (ref 20–32)
Calcium: 9.4 mg/dL (ref 8.6–10.4)
Chloride: 104 mmol/L (ref 98–110)
Creat: 0.58 mg/dL — ABNORMAL LOW (ref 0.60–1.00)
Globulin: 2.9 g/dL (calc) (ref 1.9–3.7)
Glucose, Bld: 72 mg/dL (ref 65–99)
Potassium: 4.7 mmol/L (ref 3.5–5.3)
Sodium: 141 mmol/L (ref 135–146)
Total Bilirubin: 0.2 mg/dL (ref 0.2–1.2)
Total Protein: 7 g/dL (ref 6.1–8.1)
eGFR: 97 mL/min/{1.73_m2} (ref >=60)

## 2022-05-22 LAB — VITAMIN D 25 HYDROXY (VIT D DEFICIENCY, FRACTURES): Vit D, 25-Hydroxy: 40 ng/mL (ref 30–100)

## 2022-05-26 ENCOUNTER — Encounter: Payer: Self-pay | Admitting: Family Medicine

## 2022-05-26 NOTE — Addendum Note (Signed)
Addended by: Delsa Grana on: 05/26/2022 05:14 PM   Modules accepted: Orders

## 2022-05-26 NOTE — Assessment & Plan Note (Signed)
Sx stable on cymbalta - used for "nerves" aka anxiety sx and mood phq 9 reviewed and negative today, meds refilled    05/21/2022    1:30 PM 10/30/2021   10:18 AM 10/25/2020   10:54 AM  Depression screen PHQ 2/9  Decreased Interest 0 0 1  Down, Depressed, Hopeless 0 0 1  PHQ - 2 Score 0 0 2  Altered sleeping 0 0 0  Tired, decreased energy 0 0 1  Change in appetite 0 0 0  Feeling bad or failure about yourself  0 0 0  Trouble concentrating 0 0 0  Moving slowly or fidgety/restless 0 0 0  Suicidal thoughts 0 0 0  PHQ-9 Score 0 0 3  Difficult doing work/chores Not difficult at all  Not difficult at all

## 2022-05-26 NOTE — Assessment & Plan Note (Signed)
Lab Results  Component Value Date   HGBA1C 5.8 (H) 10/30/2021  Labs recently checked and still in prediabetic range, has been stable and controlled with diet/lifestyle Labs can be done at next appt or annually

## 2022-05-26 NOTE — Assessment & Plan Note (Signed)
Gabapentin per pain specialists - see also Chronic pain

## 2022-05-26 NOTE — Assessment & Plan Note (Signed)
Per specialists - fentanyl, gabapentin, oxy cymbalta for MDD/anxiety but likely also helping to neuropathy and chronic pain

## 2022-07-31 ENCOUNTER — Ambulatory Visit
Admission: RE | Admit: 2022-07-31 | Discharge: 2022-07-31 | Disposition: A | Discharge status: Home / Self Care | Payer: Medicare Other | Source: Ambulatory Visit | Attending: Family Medicine | Admitting: Family Medicine

## 2022-07-31 DIAGNOSIS — Z780 Asymptomatic menopausal state: Secondary | ICD-10-CM

## 2022-07-31 DIAGNOSIS — Z1231 Encounter for screening mammogram for malignant neoplasm of breast: Secondary | ICD-10-CM | POA: Insufficient documentation

## 2022-11-20 ENCOUNTER — Ambulatory Visit (INDEPENDENT_AMBULATORY_CARE_PROVIDER_SITE_OTHER): Payer: Medicare Other | Admitting: Family Medicine

## 2022-11-20 ENCOUNTER — Encounter: Payer: Self-pay | Admitting: Family Medicine

## 2022-11-20 VITALS — BP 114/72 | HR 100 | Temp 98.0°F | Resp 16 | Ht 63.0 in | Wt 192.8 lb

## 2022-11-20 DIAGNOSIS — R7303 Prediabetes: Secondary | ICD-10-CM | POA: Diagnosis not present

## 2022-11-20 DIAGNOSIS — G609 Hereditary and idiopathic neuropathy, unspecified: Secondary | ICD-10-CM | POA: Diagnosis not present

## 2022-11-20 DIAGNOSIS — F325 Major depressive disorder, single episode, in full remission: Secondary | ICD-10-CM

## 2022-11-20 DIAGNOSIS — E559 Vitamin D deficiency, unspecified: Secondary | ICD-10-CM | POA: Diagnosis not present

## 2022-11-20 DIAGNOSIS — G894 Chronic pain syndrome: Secondary | ICD-10-CM | POA: Diagnosis not present

## 2022-11-20 DIAGNOSIS — F321 Major depressive disorder, single episode, moderate: Secondary | ICD-10-CM

## 2022-11-20 DIAGNOSIS — J309 Allergic rhinitis, unspecified: Secondary | ICD-10-CM | POA: Diagnosis not present

## 2022-11-20 DIAGNOSIS — M5030 Other cervical disc degeneration, unspecified cervical region: Secondary | ICD-10-CM | POA: Diagnosis not present

## 2022-11-20 DIAGNOSIS — M961 Postlaminectomy syndrome, not elsewhere classified: Secondary | ICD-10-CM

## 2022-11-20 DIAGNOSIS — R252 Cramp and spasm: Secondary | ICD-10-CM

## 2022-11-20 DIAGNOSIS — Z79891 Long term (current) use of opiate analgesic: Secondary | ICD-10-CM | POA: Diagnosis not present

## 2022-11-20 DIAGNOSIS — E785 Hyperlipidemia, unspecified: Secondary | ICD-10-CM | POA: Diagnosis not present

## 2022-11-20 MED ORDER — CROMOLYN SODIUM 4 % OP SOLN: 1.0000 [drp] | Freq: Four times a day (QID) | OPHTHALMIC | 2 refills | Status: DC | PRN

## 2022-11-20 MED ORDER — LEVOCETIRIZINE DIHYDROCHLORIDE 5 MG PO TABS: 5.0000 mg | ORAL_TABLET | Freq: Every evening | ORAL | 3 refills | Status: DC

## 2022-11-20 NOTE — Assessment & Plan Note (Signed)
Just completed Rx dose Vit D She has switched over the OTC, not sure of IU dosing right now Recommended 1000-2000 IU daily With Frax hip score 2.9% and pt at risk for falls with chronic pain and meds will follow vit D carefully to optimize

## 2022-11-20 NOTE — Progress Notes (Signed)
Name: Christina Ware   MRN: [id number]    DOB: 10/08/1950   Date:11/20/2022       Progress Note  Chief Complaint  Patient presents with   Follow-up   Hyperlipidemia   Depression     Subjective:   Christina Ware is a [age] y.o. female, presents to clinic for routine f/up  Hyperlipidemia: Hx of high cholesterol managed with only diet/lifestyle, last years lipids in normal range Last Lipids: Lab Results  Component Value Date   CHOL 168 10/30/2021   HDL 71 10/30/2021   LDLCALC 80 10/30/2021   TRIG 90 10/30/2021   CHOLHDL 2.4 10/30/2021   - Denies: Chest pain, shortness of breath, myalgias, claudication  Chronic pain syndrome - managed by specialists Is on cymbalta 30 mg BID, gabapentin, fentanyl patches and oxycodone per pain management Dr. Ethelene Halamos managing She has cramps everywhere lately - in chest wall under breast, arms, legs, even in elbow  Prediabetes Lab Results  Component Value Date   HGBA1C 5.8 (H) 10/30/2021   Dexa done last Dec Reviewed today - osteopenia with Frax 2.9% for hip, family hx of hip fx Last Vit D was normal after Rx supplement and she is maintaining on OTC supplements   She has some bumps/sores on scalp   Uncontrolled allergies this spring starting and pollen     Current Outpatient Medications:    Black Cohosh 540 MG CAPS, Take 1 capsule by mouth daily. , Disp: , Rfl:    Cetirizine HCl (ZYRTEC ALLERGY) 10 MG TBDP, Take 10 mg by mouth at bedtime., Disp: 30 tablet, Rfl: 3   docusate sodium (COLACE) 100 MG capsule, Take 200 mg by mouth daily as needed. , Disp: , Rfl:    DULoxetine (CYMBALTA) 30 MG capsule, Take 1 capsule (30 mg total) by mouth 2 (two) times daily., Disp: 180 capsule, Rfl: 3   fentaNYL (DURAGESIC) 50 MCG/HR, APPLY 1 PATCH TOPICALLY TO THE SKIN EVERY 72 HOURS, Disp: , Rfl:    fluticasone (FLONASE) 50 MCG/ACT nasal spray, Place 2 sprays into both nostrils daily., Disp: 16 g, Rfl: 2   gabapentin (NEURONTIN) 800 MG tablet,  Take 800 mg by mouth 2 (two) times daily as needed. , Disp: , Rfl:    Oxycodone HCl 10 MG TABS, oxycodone 10 mg tablet  Take 1 tablet 4 times a day by oral route as needed., Disp: , Rfl:    rOPINIRole (REQUIP) 0.5 MG tablet, Take 0.5 mg by mouth daily as needed., Disp: , Rfl:    SYSTANE ULTRA 0.4-0.3 % SOLN, Apply 1 drop to eye 3 (three) times daily., Disp: , Rfl:   Patient Active Problem List   Diagnosis Date Noted   Current moderate episode of major depressive disorder, unspecified whether recurrent 10/30/2021   Idiopathic peripheral neuropathy 10/25/2020   Tremor, unspecified 10/25/2020   COVID 08/22/2020   DDD (degenerative disc disease), cervical 02/08/2018   BMI 37.0-37.9, adult 12/22/2017   Long-term current use of opiate analgesic 09/01/2017   Chronic low back pain 09/01/2017   Chronic pain syndrome 09/01/2017   Lumbar post-laminectomy syndrome 09/01/2017   Chronic nasal congestion 04/07/2017   Warts 07/08/2016   Obesity (BMI 35.0-39.9 without comorbidity) 07/08/2016   Prediabetes 07/26/2015    Past Surgical History:  Procedure Laterality Date   BACK SURGERY  2005   lower back   BREAST BIOPSY Right 10/25/2020   affirm bx, coil marker, fibroadenoma   FOOT SURGERY Bilateral    twice   KNEE ARTHROSCOPY  Right     Family History  Problem Relation Age of Onset   Diabetes Mother    Cancer Mother    Hypertension Mother    Diabetes Father    Cancer Father    Hypertension Father    Diabetes Sister    Hypertension Sister    Diabetes Brother    Cancer Brother    Hypertension Brother    Diabetes Brother    Birth defects Brother        born with a hole in his heart   Lung disease Brother        patient needed a lung transplant   Heart disease Brother        needed a heart transplant   Breast cancer Neg Hx     Social History   Tobacco Use   Smoking status: Former   Smokeless tobacco: Never   Tobacco comments:    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    Vaping Use: Never used  Substance Use Topics   Alcohol use: No    Alcohol/week: 0.0 standard drinks of alcohol   Drug use: No     Allergies  Allergen Reactions   Codeine Other (See Comments)   Hydrocodone    Latex     Health Maintenance  Topic Date Due   DTaP/Tdap/Td (1 - Tdap) Never done   Lung Cancer Screening  Never done   Medicare Annual Wellness (AWV)  07/08/2018   COVID-19 Vaccine (3 - 2023-24 season) 12/06/2022 (Originally 04/11/2022)   Fecal DNA (Cologuard)  01/24/2023   INFLUENZA VACCINE  03/12/2023   MAMMOGRAM  08/01/2023   DEXA SCAN  07/31/2024   Pneumonia Vaccine 73+ Years old  Completed   Hepatitis C Screening  Completed   Zoster Vaccines- Shingrix  Completed   HPV VACCINES  Aged Out    Chart Review Today: I personally reviewed active problem list, medication list, allergies, family history, social history, health maintenance, notes from last encounter, lab results, imaging with the patient/caregiver today.   Review of Systems  Constitutional: Negative.   HENT: Negative.    Eyes: Negative.   Respiratory: Negative.    Cardiovascular: Negative.   Gastrointestinal: Negative.   Endocrine: Negative.   Genitourinary: Negative.   Musculoskeletal: Negative.   Skin: Negative.   Allergic/Immunologic: Negative.   Neurological: Negative.   Hematological: Negative.   Psychiatric/Behavioral: Negative.    All other systems reviewed and are negative.    Objective:   Vitals:   11/20/22 1031  BP: 114/72  Pulse: 100  Resp: 16  Temp: 98 F (36.7 C)  TempSrc: Oral  SpO2: 94%  Weight: 192 lb 12.8 oz (87.5 kg)  Height: 5\' 3"  (1.6 m)    Body mass index is 34.15 kg/m.  Physical Exam Vitals and nursing note reviewed.  Constitutional:      General: She is not in acute distress.    Appearance: Normal appearance. She is well-developed. She is obese. She is not ill-appearing, toxic-appearing or diaphoretic.  HENT:     Head: Normocephalic and atraumatic.      Right Ear: External ear normal.     Left Ear: External ear normal.     Nose: Congestion present.     Mouth/Throat:     Mouth: Mucous membranes are dry.     Pharynx: Oropharynx is clear. No oropharyngeal exudate or posterior oropharyngeal erythema.  Eyes:     General: No scleral icterus.       Right eye:  No discharge.        Left eye: No discharge.     Conjunctiva/sclera: Conjunctivae normal.  Neck:     Trachea: No tracheal deviation.  Cardiovascular:     Rate and Rhythm: Normal rate and regular rhythm.     Pulses: Normal pulses.     Heart sounds: Normal heart sounds.  Pulmonary:     Effort: Pulmonary effort is normal. No respiratory distress.     Breath sounds: No stridor. No wheezing, rhonchi or rales.  Abdominal:     General: Bowel sounds are normal. There is no distension.     Palpations: Abdomen is soft.     Tenderness: There is no abdominal tenderness. There is no guarding.  Musculoskeletal:        General: Normal range of motion.  Skin:    General: Skin is warm and dry.     Findings: Lesion (two dry scabbed sores to posterior lower scalp, non-tender, no swelling) present. No rash.  Neurological:     Mental Status: She is alert.     Motor: No abnormal muscle tone.     Coordination: Coordination normal.     Gait: Gait abnormal (chronic).  Psychiatric:        Mood and Affect: Mood normal.        Behavior: Behavior normal.         Assessment & Plan:   Problem List Items Addressed This Visit       Nervous and Auditory   Idiopathic peripheral neuropathy    Per specialist On gabapentin and cymbalta Sx stable        Musculoskeletal and Integument   DDD (degenerative disc disease), cervical    Per specialists        Other   Prediabetes - Primary    Last A1C 5.8 Recheck today      Relevant Orders   COMPLETE METABOLIC PANEL WITH GFR   Hemoglobin A1c   Long-term current use of opiate analgesic    PDMP reviewed, per pain management      Chronic pain  syndrome    Per specialists on multple meds, monitor for risks (unintentional OD, polypharmacy, falls etc)      Lumbar post-laminectomy syndrome   Current moderate episode of major depressive disorder, unspecified whether recurrent    On cymbalta for pain and "nerves" with hx of MDD    11/20/2022   10:30 AM 05/21/2022    1:30 PM 10/30/2021   10:18 AM  Depression screen PHQ 2/9  Decreased Interest 0 0 0  Down, Depressed, Hopeless 0 0 0  PHQ - 2 Score 0 0 0  Altered sleeping 0 0 0  Tired, decreased energy 0 0 0  Change in appetite 0 0 0  Feeling bad or failure about yourself  0 0 0  Trouble concentrating 0 0 0  Moving slowly or fidgety/restless 0 0 0  Suicidal thoughts 0 0 0  PHQ-9 Score 0 0 0  Difficult doing work/chores Not difficult at all Not difficult at all   PHQ 9 reviewed and negative       Hyperlipidemia    Managed with diet/lifestyle, not on meds Lab Results  Component Value Date   CHOL 168 10/30/2021   HDL 71 10/30/2021   LDLCALC 80 10/30/2021   TRIG 90 10/30/2021   CHOLHDL 2.4 10/30/2021        Relevant Orders   COMPLETE METABOLIC PANEL WITH GFR   Lipid panel   Vitamin D deficiency  Just completed Rx dose Vit D She has switched over the OTC, not sure of IU dosing right now Recommended 1000-2000 IU daily With Frax hip score 2.9% and pt at risk for falls with chronic pain and meds will follow vit D carefully to optimize      Relevant Orders   COMPLETE METABOLIC PANEL WITH GFR   VITAMIN D 25 Hydroxy (Vit-D Deficiency, Fractures)   Other Visit Diagnoses     Allergic rhinitis, unspecified seasonality, unspecified trigger       chronic, much worse this time of year, trial of xyzal, continue flonase, add eye drops, offered singulair   Relevant Medications   levocetirizine (XYZAL) 5 MG tablet   cromolyn (OPTICROM) 4 % ophthalmic solution   Muscle cramps       r/o deficiencies, unclear etiology, no myalgias, already on cymbalta and gabapentin    Relevant Orders   COMPLETE METABOLIC PANEL WITH GFR   Magnesium        Return in about 6 months (around 05/22/2023) for Routine follow-up.   Danelle Berry, PA-C 11/20/22 10:42 AM

## 2022-11-20 NOTE — Assessment & Plan Note (Signed)
Managed with diet/lifestyle, not on meds Lab Results  Component Value Date   CHOL 168 10/30/2021   HDL 71 10/30/2021   LDLCALC 80 10/30/2021   TRIG 90 10/30/2021   CHOLHDL 2.4 10/30/2021

## 2022-11-20 NOTE — Patient Instructions (Signed)
If you continue to have allergies be severe and bothersome and it does not improve with changing to xyzal for your allergy med, please let me know so I can send in an additional prescription medication to add to your allergy pills and nasal sprays

## 2022-11-20 NOTE — Assessment & Plan Note (Signed)
Last A1C 5.8 Recheck today

## 2022-11-20 NOTE — Assessment & Plan Note (Signed)
Per specialists 

## 2022-11-20 NOTE — Assessment & Plan Note (Signed)
PDMP reviewed, per pain management

## 2022-11-20 NOTE — Assessment & Plan Note (Addendum)
On cymbalta for pain and "nerves" with hx of MDD    11/20/2022   10:30 AM 05/21/2022    1:30 PM 10/30/2021   10:18 AM  Depression screen PHQ 2/9  Decreased Interest 0 0 0  Down, Depressed, Hopeless 0 0 0  PHQ - 2 Score 0 0 0  Altered sleeping 0 0 0  Tired, decreased energy 0 0 0  Change in appetite 0 0 0  Feeling bad or failure about yourself  0 0 0  Trouble concentrating 0 0 0  Moving slowly or fidgety/restless 0 0 0  Suicidal thoughts 0 0 0  PHQ-9 Score 0 0 0  Difficult doing work/chores Not difficult at all Not difficult at all   PHQ 9 reviewed and negative

## 2022-11-20 NOTE — Assessment & Plan Note (Signed)
Per specialist On gabapentin and cymbalta Sx stable

## 2022-11-20 NOTE — Assessment & Plan Note (Signed)
Per specialists on multple meds, monitor for risks (unintentional OD, polypharmacy, falls etc)

## 2022-11-21 LAB — LIPID PANEL
Cholesterol: 206 mg/dL — ABNORMAL HIGH (ref <200)
HDL: 84 mg/dL (ref >=50)
LDL Cholesterol (Calc): 99 mg/dL (calc)
Non-HDL Cholesterol (Calc): 122 mg/dL (calc) (ref <130)
Total CHOL/HDL Ratio: 2.5 (calc) (ref <5.0)
Triglycerides: 126 mg/dL (ref <150)

## 2022-11-21 LAB — COMPLETE METABOLIC PANEL WITH GFR
AG Ratio: 1.4 (calc) (ref 1.0–2.5)
ALT: 9 U/L (ref 6–29)
AST: 17 U/L (ref 10–35)
Albumin: 3.9 g/dL (ref 3.6–5.1)
Alkaline phosphatase (APISO): 66 U/L (ref 37–153)
BUN: 14 mg/dL (ref 7–25)
CO2: 28 mmol/L (ref 20–32)
Calcium: 9.2 mg/dL (ref 8.6–10.4)
Chloride: 103 mmol/L (ref 98–110)
Creat: 0.64 mg/dL (ref 0.60–1.00)
Globulin: 2.8 g/dL (calc) (ref 1.9–3.7)
Glucose, Bld: 98 mg/dL (ref 65–99)
Potassium: 4.2 mmol/L (ref 3.5–5.3)
Sodium: 140 mmol/L (ref 135–146)
Total Bilirubin: 0.3 mg/dL (ref 0.2–1.2)
Total Protein: 6.7 g/dL (ref 6.1–8.1)
eGFR: 95 mL/min/{1.73_m2} (ref >=60)

## 2022-11-21 LAB — MAGNESIUM: Magnesium: 2 mg/dL (ref 1.5–2.5)

## 2022-11-21 LAB — HEMOGLOBIN A1C
Hgb A1c MFr Bld: 6.1 % of total Hgb — ABNORMAL HIGH (ref <5.7)
Mean Plasma Glucose: 128 mg/dL
eAG (mmol/L): 7.1 mmol/L

## 2022-11-21 LAB — VITAMIN D 25 HYDROXY (VIT D DEFICIENCY, FRACTURES): Vit D, 25-Hydroxy: 25 ng/mL — ABNORMAL LOW (ref 30–100)

## 2022-11-26 ENCOUNTER — Other Ambulatory Visit: Payer: Self-pay | Admitting: Family Medicine

## 2022-11-26 DIAGNOSIS — E559 Vitamin D deficiency, unspecified: Secondary | ICD-10-CM

## 2022-11-26 MED ORDER — VITAMIN D (ERGOCALCIFEROL) 1.25 MG (50000 UNIT) PO CAPS: 50000.0000 [IU] | ORAL_CAPSULE | ORAL | 0 refills | Status: AC

## 2022-11-27 DIAGNOSIS — M5030 Other cervical disc degeneration, unspecified cervical region: Secondary | ICD-10-CM | POA: Diagnosis not present

## 2022-11-27 DIAGNOSIS — Z79891 Long term (current) use of opiate analgesic: Secondary | ICD-10-CM | POA: Diagnosis not present

## 2022-11-27 DIAGNOSIS — M5412 Radiculopathy, cervical region: Secondary | ICD-10-CM | POA: Diagnosis not present

## 2022-11-27 DIAGNOSIS — M961 Postlaminectomy syndrome, not elsewhere classified: Secondary | ICD-10-CM | POA: Diagnosis not present

## 2022-11-27 DIAGNOSIS — G894 Chronic pain syndrome: Secondary | ICD-10-CM | POA: Diagnosis not present

## 2022-12-17 DIAGNOSIS — M961 Postlaminectomy syndrome, not elsewhere classified: Secondary | ICD-10-CM | POA: Diagnosis not present

## 2023-01-12 DIAGNOSIS — Z1211 Encounter for screening for malignant neoplasm of colon: Secondary | ICD-10-CM | POA: Diagnosis not present

## 2023-01-12 DIAGNOSIS — Z1212 Encounter for screening for malignant neoplasm of rectum: Secondary | ICD-10-CM | POA: Diagnosis not present

## 2023-01-19 LAB — COLOGUARD
COLOGUARD: NEGATIVE
Cologuard: NEGATIVE

## 2023-02-15 ENCOUNTER — Other Ambulatory Visit: Payer: Self-pay | Admitting: Family Medicine

## 2023-02-15 DIAGNOSIS — E559 Vitamin D deficiency, unspecified: Secondary | ICD-10-CM

## 2023-03-15 ENCOUNTER — Other Ambulatory Visit: Payer: Self-pay | Admitting: Family Medicine

## 2023-03-15 DIAGNOSIS — G609 Hereditary and idiopathic neuropathy, unspecified: Secondary | ICD-10-CM

## 2023-03-15 DIAGNOSIS — G894 Chronic pain syndrome: Secondary | ICD-10-CM

## 2023-03-17 DIAGNOSIS — M5412 Radiculopathy, cervical region: Secondary | ICD-10-CM | POA: Diagnosis not present

## 2023-03-17 DIAGNOSIS — M5030 Other cervical disc degeneration, unspecified cervical region: Secondary | ICD-10-CM | POA: Diagnosis not present

## 2023-03-17 DIAGNOSIS — M25511 Pain in right shoulder: Secondary | ICD-10-CM | POA: Diagnosis not present

## 2023-03-17 DIAGNOSIS — Z8739 Personal history of other diseases of the musculoskeletal system and connective tissue: Secondary | ICD-10-CM | POA: Diagnosis not present

## 2023-03-17 DIAGNOSIS — G894 Chronic pain syndrome: Secondary | ICD-10-CM | POA: Diagnosis not present

## 2023-03-26 ENCOUNTER — Other Ambulatory Visit: Payer: Self-pay | Admitting: Family Medicine

## 2023-03-26 DIAGNOSIS — E559 Vitamin D deficiency, unspecified: Secondary | ICD-10-CM

## 2023-05-12 DIAGNOSIS — M25512 Pain in left shoulder: Secondary | ICD-10-CM | POA: Diagnosis not present

## 2023-05-12 DIAGNOSIS — M542 Cervicalgia: Secondary | ICD-10-CM | POA: Diagnosis not present

## 2023-05-12 DIAGNOSIS — M25511 Pain in right shoulder: Secondary | ICD-10-CM | POA: Diagnosis not present

## 2023-05-22 ENCOUNTER — Ambulatory Visit: Payer: Medicare Other | Admitting: Family Medicine

## 2023-05-26 ENCOUNTER — Encounter: Payer: Self-pay | Admitting: Physician Assistant

## 2023-05-26 ENCOUNTER — Ambulatory Visit (INDEPENDENT_AMBULATORY_CARE_PROVIDER_SITE_OTHER): Payer: Medicare Other | Admitting: Physician Assistant

## 2023-05-26 VITALS — BP 132/82 | HR 104 | Temp 98.2°F | Resp 16 | Ht 63.0 in | Wt 192.4 lb

## 2023-05-26 DIAGNOSIS — E785 Hyperlipidemia, unspecified: Secondary | ICD-10-CM | POA: Diagnosis not present

## 2023-05-26 DIAGNOSIS — Z79891 Long term (current) use of opiate analgesic: Secondary | ICD-10-CM | POA: Diagnosis not present

## 2023-05-26 DIAGNOSIS — R7303 Prediabetes: Secondary | ICD-10-CM | POA: Diagnosis not present

## 2023-05-26 DIAGNOSIS — E559 Vitamin D deficiency, unspecified: Secondary | ICD-10-CM

## 2023-05-26 DIAGNOSIS — G609 Hereditary and idiopathic neuropathy, unspecified: Secondary | ICD-10-CM | POA: Diagnosis not present

## 2023-05-26 NOTE — Assessment & Plan Note (Signed)
Recheck labs today Results to dictate further management

## 2023-05-26 NOTE — Assessment & Plan Note (Addendum)
Most recent A1c was 6.1 Recheck today Results to dictate further management  Follow up in 6 months or sooner if A1c has increased into Diabetic levels

## 2023-05-26 NOTE — Assessment & Plan Note (Addendum)
Chronic, historic condition Currently managed by Pain Management  Appears to be taking Cymbalta, Gabapentin, oxycodone, fentanyl per pain management therapy plan  She reports cramping all over her body - unsure if this is neuropathy vs RLS symptoms  Will check magnesium, CMP, CBC for cramping etiology rule out Will defer to specialty recommendations for management

## 2023-05-26 NOTE — Progress Notes (Unsigned)
Established Patient Office Visit  Name: Christina Ware   MRN: [id number]    DOB: 1951-07-15   Date:05/26/2023  Today's Provider: Jacquelin Hawking, MHS, PA-C Introduced myself to the patient as a PA-C and provided education on APPs in clinical practice.         Subjective  Chief Complaint  Chief Complaint  Patient presents with   Medical Management of Chronic Issues    HPI   HYPERLIPIDEMIA Hyperlipidemia status: currently managed with lifestyle  Satisfied with current treatment?  yes Side effects:  NA Medication compliance:  NA Past cholesterol meds: none  Supplements: none Aspirin:  no The 10-year ASCVD risk score (Arnett DK, et al., 2019) is: 10.6%   Values used to calculate the score:     Age: 72 years     Sex: Female     Is Non-Hispanic African American: No     Diabetic: No     Tobacco smoker: No     Systolic Blood Pressure: 132 mmHg     Is BP treated: No     HDL Cholesterol: 84 mg/dL     Total Cholesterol: 206 mg/dL Chest pain:  no    Prediabetes Most recent A1c was 6.1  Not currently on medications   She has concerns for gastroenteritis She reports having fever, chills,flushing, nausea, diarrhea, vomiting  She states she has had it about once per year and one time went to the ED  She denies current symptoms today but is worried why she keeps getting it    Neuropathy  She reports she is getting cramps all over  She states it is hard to get them to go away  She is managed by Pain management for this    She reports a fall in her bathtub about 2 weeks ago  She denies hitting her head when she fell. She denies LOC during and after fall  Reports hitting her buttocks, thoracic back and shoulders during fall She reports persistent pain in left ribs She declines imaging today    Patient Active Problem List   Diagnosis Date Noted   Hyperlipidemia 11/20/2022   Vitamin D deficiency 11/20/2022   Current moderate episode of major depressive  disorder, unspecified whether recurrent (HCC) 10/30/2021   Idiopathic peripheral neuropathy 10/25/2020   Tremor, unspecified 10/25/2020   DDD (degenerative disc disease), cervical 02/08/2018   Long-term current use of opiate analgesic 09/01/2017   Chronic pain syndrome 09/01/2017   Lumbar post-laminectomy syndrome 09/01/2017   Chronic nasal congestion 04/07/2017   Prediabetes 07/26/2015    Past Surgical History:  Procedure Laterality Date   BACK SURGERY  2005   lower back   BREAST BIOPSY Right 10/25/2020   affirm bx, coil marker, fibroadenoma   FOOT SURGERY Bilateral    twice   KNEE ARTHROSCOPY Right     Family History  Problem Relation Age of Onset   Diabetes Mother    Cancer Mother    Hypertension Mother    Diabetes Father    Cancer Father    Hypertension Father    Diabetes Sister    Hypertension Sister    Diabetes Brother    Cancer Brother    Hypertension Brother    Diabetes Brother    Birth defects Brother        born with a hole in his heart   Lung disease Brother        patient needed a lung transplant   Heart  disease Brother        needed a heart transplant   Breast cancer Neg Hx     Social History   Tobacco Use   Smoking status: Former   Smokeless tobacco: Never   Tobacco comments:    quit when she was 72yrs old  Substance Use Topics   Alcohol use: No    Alcohol/week: 0.0 standard drinks of alcohol     Current Outpatient Medications:    Black Cohosh 540 MG CAPS, Take 1 capsule by mouth daily. , Disp: , Rfl:    cromolyn (OPTICROM) 4 % ophthalmic solution, Place 1 drop into both eyes 4 (four) times daily as needed (eye allergies)., Disp: 10 mL, Rfl: 2   docusate sodium (COLACE) 100 MG capsule, Take 200 mg by mouth daily as needed. , Disp: , Rfl:    DULoxetine (CYMBALTA) 30 MG capsule, TAKE 1 CAPSULE BY MOUTH TWICE  DAILY, Disp: 180 capsule, Rfl: 1   fentaNYL (DURAGESIC) 50 MCG/HR, APPLY 1 PATCH TOPICALLY TO THE SKIN EVERY 72 HOURS, Disp: , Rfl:     fluticasone (FLONASE) 50 MCG/ACT nasal spray, Place 2 sprays into both nostrils daily., Disp: 16 g, Rfl: 2   gabapentin (NEURONTIN) 800 MG tablet, Take 800 mg by mouth 2 (two) times daily as needed. , Disp: , Rfl:    levocetirizine (XYZAL) 5 MG tablet, Take 1 tablet (5 mg total) by mouth every evening., Disp: 90 tablet, Rfl: 3   Oxycodone HCl 10 MG TABS, oxycodone 10 mg tablet  Take 1 tablet 4 times a day by oral route as needed., Disp: , Rfl:    rOPINIRole (REQUIP) 0.5 MG tablet, Take 0.5 mg by mouth daily as needed., Disp: , Rfl:    SYSTANE ULTRA 0.4-0.3 % SOLN, Apply 1 drop to eye 3 (three) times daily., Disp: , Rfl:    Vitamin D, Ergocalciferol, (DRISDOL) 1.25 MG (50000 UNIT) CAPS capsule, Take 1 capsule (50,000 Units total) by mouth every 7 (seven) days. x12 weeks., Disp: 12 capsule, Rfl: 0  Allergies  Allergen Reactions   Codeine Other (See Comments)   Hydrocodone    Latex     I personally reviewed active problem list, medication list, allergies, health maintenance, notes from last encounter, lab results with the patient/caregiver today.   Review of Systems  Constitutional:  Negative for chills and fever.  Gastrointestinal:  Negative for constipation, diarrhea, nausea and vomiting.  Neurological:  Negative for dizziness and headaches.      Objective  Vitals:   05/26/23 1306  BP: 132/82  Pulse: (!) 104  Resp: 16  Temp: 98.2 F (36.8 C)  TempSrc: Oral  SpO2: 92%  Weight: 192 lb 6.4 oz (87.3 kg)  Height: 5\' 3"  (1.6 m)    Body mass index is 34.08 kg/m.  Physical Exam Vitals reviewed.  Constitutional:      General: She is awake.     Appearance: Normal appearance. She is well-developed and well-groomed.  HENT:     Head: Normocephalic and atraumatic.  Cardiovascular:     Rate and Rhythm: Normal rate and regular rhythm.     Pulses: Normal pulses.          Radial pulses are 2+ on the right side and 2+ on the left side.     Heart sounds: Normal heart sounds. No  murmur heard.    No friction rub. No gallop.  Pulmonary:     Effort: Pulmonary effort is normal.     Breath sounds: Normal  breath sounds. No decreased air movement. No decreased breath sounds, wheezing, rhonchi or rales.  Musculoskeletal:     Cervical back: Normal range of motion.  Skin:    General: Skin is warm.  Neurological:     General: No focal deficit present.     Mental Status: She is alert and oriented to person, place, and time.  Psychiatric:        Attention and Perception: Attention and perception normal.        Mood and Affect: Mood and affect normal.        Speech: Speech is tangential.        Behavior: Behavior normal. Behavior is cooperative.      No results found for this or any previous visit (from the past 2160 hour(s)).   PHQ2/9:    05/26/2023    1:02 PM 11/20/2022   10:30 AM 05/21/2022    1:30 PM 10/30/2021   10:18 AM 10/25/2020   10:54 AM  Depression screen PHQ 2/9  Decreased Interest 0 0 0 0 1  Down, Depressed, Hopeless 0 0 0 0 1  PHQ - 2 Score 0 0 0 0 2  Altered sleeping 0 0 0 0 0  Tired, decreased energy 0 0 0 0 1  Change in appetite 0 0 0 0 0  Feeling bad or failure about yourself  0 0 0 0 0  Trouble concentrating 0 0 0 0 0  Moving slowly or fidgety/restless 0 0 0 0 0  Suicidal thoughts 0 0 0 0 0  PHQ-9 Score 0 0 0 0 3  Difficult doing work/chores Not difficult at all Not difficult at all Not difficult at all  Not difficult at all      Fall Risk:    05/26/2023    1:01 PM 11/20/2022   10:29 AM 05/21/2022    1:18 PM 10/30/2021   10:10 AM 10/25/2020   10:54 AM  Fall Risk   Falls in the past year? 0 1 1 1  0  Number falls in past yr: 0 0 0 0 0  Injury with Fall? 0 0 0 0 0  Comment    not evaluated   Risk for fall due to : No Fall Risks Impaired balance/gait Impaired balance/gait History of fall(s)   Follow up Falls prevention discussed;Education provided;Falls evaluation completed Falls prevention discussed;Education provided;Falls  evaluation completed Falls prevention discussed;Education provided Falls prevention discussed;Education provided       Functional Status Survey: Is the patient deaf or have difficulty hearing?: No Does the patient have difficulty seeing, even when wearing glasses/contacts?: No Does the patient have difficulty concentrating, remembering, or making decisions?: No Does the patient have difficulty walking or climbing stairs?: No Does the patient have difficulty dressing or bathing?: No Does the patient have difficulty doing errands alone such as visiting a doctor's office or shopping?: No    Assessment & Plan  Problem List Items Addressed This Visit       Nervous and Auditory   Idiopathic peripheral neuropathy    Chronic, historic condition Currently managed by Pain Management  Appears to be taking Cymbalta, Gabapentin, oxycodone, fentanyl per pain management therapy plan  She reports cramping all over her body - unsure if this is neuropathy vs RLS symptoms  Will check magnesium, CMP, CBC for cramping etiology rule out Will defer to specialty recommendations for management        Relevant Orders   Magnesium     Other   Prediabetes -  Primary    Most recent A1c was 6.1 Recheck today Results to dictate further management  Follow up in 6 months or sooner if A1c has increased into Diabetic levels        Relevant Orders   COMPLETE METABOLIC PANEL WITH GFR   CBC w/Diff/Platelet   HgB A1c   Hyperlipidemia    Chronic, historic condition Appears to be managed with lifestyle currently The 10-year ASCVD risk score (Arnett DK, et al., 2019) is: 10.6% Recheck lipid panel today- most likely will need statin for management Results to dictate further management  Follow up in 6 months or sooner if concerns arise        Relevant Orders   Lipid Profile   Vitamin D deficiency    Recheck labs today Results to dictate further management       Relevant Orders   Vitamin D (25  hydroxy)     Return in about 6 months (around 11/24/2023) for HLD, prediabetes.   I, Nasiah Lehenbauer E Augusta Hilbert, PA-C, have reviewed all documentation for this visit. The documentation on 05/26/23 for the exam, diagnosis, procedures, and orders are all accurate and complete.   Jacquelin Hawking, MHS, PA-C [facility]

## 2023-05-26 NOTE — Assessment & Plan Note (Signed)
Chronic, historic condition Appears to be managed with lifestyle currently The 10-year ASCVD risk score (Arnett DK, et al., 2019) is: 10.6% Recheck lipid panel today- most likely will need statin for management Results to dictate further management  Follow up in 6 months or sooner if concerns arise

## 2023-05-27 LAB — COMPLETE METABOLIC PANEL WITH GFR
AG Ratio: 1.4 (calc) (ref 1.0–2.5)
ALT: 8 U/L (ref 6–29)
AST: 13 U/L (ref 10–35)
Albumin: 4 g/dL (ref 3.6–5.1)
Alkaline phosphatase (APISO): 73 U/L (ref 37–153)
BUN: 12 mg/dL (ref 7–25)
CO2: 31 mmol/L (ref 20–32)
Calcium: 9.3 mg/dL (ref 8.6–10.4)
Chloride: 101 mmol/L (ref 98–110)
Creat: 0.74 mg/dL (ref 0.60–1.00)
Globulin: 2.8 g/dL (ref 1.9–3.7)
Glucose, Bld: 51 mg/dL — ABNORMAL LOW (ref 65–99)
Potassium: 4.6 mmol/L (ref 3.5–5.3)
Sodium: 140 mmol/L (ref 135–146)
Total Bilirubin: 0.3 mg/dL (ref 0.2–1.2)
Total Protein: 6.8 g/dL (ref 6.1–8.1)
eGFR: 86 mL/min/{1.73_m2} (ref >=60)

## 2023-05-27 LAB — CBC WITH DIFFERENTIAL/PLATELET
Absolute Lymphocytes: 2315 {cells}/uL (ref 850–3900)
Absolute Monocytes: 553 {cells}/uL (ref 200–950)
Basophils Absolute: 47 {cells}/uL (ref 0–200)
Basophils Relative: 0.6 %
Eosinophils Absolute: 269 {cells}/uL (ref 15–500)
Eosinophils Relative: 3.4 %
HCT: 41.5 % (ref 35.0–45.0)
Hemoglobin: 13.6 g/dL (ref 11.7–15.5)
MCH: 30.9 pg (ref 27.0–33.0)
MCHC: 32.8 g/dL (ref 32.0–36.0)
MCV: 94.3 fL (ref 80.0–100.0)
MPV: 10 fL (ref 7.5–12.5)
Monocytes Relative: 7 %
Neutro Abs: 4716 {cells}/uL (ref 1500–7800)
Neutrophils Relative %: 59.7 %
Platelets: 260 10*3/uL (ref 140–400)
RBC: 4.4 10*6/uL (ref 3.80–5.10)
RDW: 12.6 % (ref 11.0–15.0)
Total Lymphocyte: 29.3 %
WBC: 7.9 10*3/uL (ref 3.8–10.8)

## 2023-05-27 LAB — VITAMIN D 25 HYDROXY (VIT D DEFICIENCY, FRACTURES): Vit D, 25-Hydroxy: 29 ng/mL — ABNORMAL LOW (ref 30–100)

## 2023-05-27 LAB — HEMOGLOBIN A1C
Hgb A1c MFr Bld: 6 %{Hb} — ABNORMAL HIGH (ref <5.7)
Mean Plasma Glucose: 126 mg/dL
eAG (mmol/L): 7 mmol/L

## 2023-05-27 LAB — LIPID PANEL
Cholesterol: 194 mg/dL (ref <200)
HDL: 88 mg/dL (ref >=50)
LDL Cholesterol (Calc): 91 mg/dL
Non-HDL Cholesterol (Calc): 106 mg/dL (ref <130)
Total CHOL/HDL Ratio: 2.2 (calc) (ref <5.0)
Triglycerides: 67 mg/dL (ref <150)

## 2023-05-27 LAB — MAGNESIUM: Magnesium: 1.8 mg/dL (ref 1.5–2.5)

## 2023-05-27 NOTE — Progress Notes (Signed)
Your labs are back Your electrolytes, liver and kidney function were all in relatively normal ranges with the exception of your glucose which was very low Please let us know if you have been feeling faint, dizzy, weak, sweaty lately as low blood sugars can lead to significant health problems and emergencies Your A1c was stable at 6.0.  This is still within prediabetic range and does not require medication management at this time Your vitamin D is still low.  Please continue taking your weekly vitamin D supplement. Your cholesterol looks good Your CBC is overall in normal limits and does not show any signs of anemia Your magnesium was normal

## 2023-07-23 DIAGNOSIS — M5030 Other cervical disc degeneration, unspecified cervical region: Secondary | ICD-10-CM | POA: Diagnosis not present

## 2023-07-23 DIAGNOSIS — Z79891 Long term (current) use of opiate analgesic: Secondary | ICD-10-CM | POA: Diagnosis not present

## 2023-07-23 DIAGNOSIS — G894 Chronic pain syndrome: Secondary | ICD-10-CM | POA: Diagnosis not present

## 2023-07-23 DIAGNOSIS — M961 Postlaminectomy syndrome, not elsewhere classified: Secondary | ICD-10-CM | POA: Diagnosis not present

## 2023-07-23 DIAGNOSIS — M5412 Radiculopathy, cervical region: Secondary | ICD-10-CM | POA: Diagnosis not present

## 2023-08-04 ENCOUNTER — Other Ambulatory Visit: Payer: Self-pay | Admitting: Family Medicine

## 2023-08-04 DIAGNOSIS — G609 Hereditary and idiopathic neuropathy, unspecified: Secondary | ICD-10-CM

## 2023-08-04 DIAGNOSIS — G894 Chronic pain syndrome: Secondary | ICD-10-CM

## 2023-08-04 NOTE — Telephone Encounter (Signed)
Rx 03/17/23 #180 1RF- too soon Requested Prescriptions  Pending Prescriptions Disp Refills   DULoxetine (CYMBALTA) 30 MG capsule [Pharmacy Med Name: DULoxetine HCl 30 MG Oral Capsule Delayed Release Particles] 180 capsule 3    Sig: TAKE 1 CAPSULE BY MOUTH TWICE  DAILY     Psychiatry: Antidepressants - SNRI - duloxetine Passed - 08/04/2023  3:27 PM      Passed - Cr in normal range and within 360 days    Creat  Date Value Ref Range Status  05/26/2023 0.74 0.60 - 1.00 mg/dL Final         Passed - eGFR is 30 or above and within 360 days    GFR, Est African American  Date Value Ref Range Status  10/29/2020 105 > OR = 60 mL/min/1.80m2 Final   GFR, Est Non African American  Date Value Ref Range Status  10/29/2020 91 > OR = 60 mL/min/1.11m2 Final   eGFR  Date Value Ref Range Status  05/26/2023 86 > OR = 60 mL/min/1.73m2 Final         Passed - Completed PHQ-2 or PHQ-9 in the last 360 days      Passed - Last BP in normal range    BP Readings from Last 1 Encounters:  05/26/23 132/82         Passed - Valid encounter within last 6 months    Recent Outpatient Visits           2 months ago Prediabetes   [facility] Mecum, Oswaldo Conroy, PA-C   8 months ago Prediabetes   [facility] Danelle Berry, PA-C   1 year ago Major depressive disorder in full remission, unspecified whether recurrent [facility])   [facility] Danelle Berry, PA-C   1 year ago Chronic pain syndrome   [facility] [place] Margarita Mail, DO   2 years ago Involuntary jerky movements   [facility] Danelle Berry, [place]

## 2023-09-11 ENCOUNTER — Other Ambulatory Visit: Payer: Self-pay

## 2023-09-11 ENCOUNTER — Emergency Department: Payer: Medicare Other

## 2023-09-11 ENCOUNTER — Emergency Department
Admission: EM | Admit: 2023-09-11 | Discharge: 2023-09-11 | Disposition: A | Discharge status: Home / Self Care | Payer: Medicare Other | Attending: Emergency Medicine | Admitting: Emergency Medicine

## 2023-09-11 DIAGNOSIS — Y92410 Unspecified street and highway as the place of occurrence of the external cause: Secondary | ICD-10-CM | POA: Diagnosis not present

## 2023-09-11 DIAGNOSIS — S29012A Strain of muscle and tendon of back wall of thorax, initial encounter: Secondary | ICD-10-CM | POA: Insufficient documentation

## 2023-09-11 DIAGNOSIS — M5134 Other intervertebral disc degeneration, thoracic region: Secondary | ICD-10-CM | POA: Diagnosis not present

## 2023-09-11 DIAGNOSIS — Z743 Need for continuous supervision: Secondary | ICD-10-CM | POA: Diagnosis not present

## 2023-09-11 DIAGNOSIS — S29019A Strain of muscle and tendon of unspecified wall of thorax, initial encounter: Secondary | ICD-10-CM

## 2023-09-11 DIAGNOSIS — M47814 Spondylosis without myelopathy or radiculopathy, thoracic region: Secondary | ICD-10-CM | POA: Diagnosis not present

## 2023-09-11 DIAGNOSIS — M47812 Spondylosis without myelopathy or radiculopathy, cervical region: Secondary | ICD-10-CM | POA: Diagnosis not present

## 2023-09-11 DIAGNOSIS — R6889 Other general symptoms and signs: Secondary | ICD-10-CM | POA: Diagnosis not present

## 2023-09-11 DIAGNOSIS — S29002A Unspecified injury of muscle and tendon of back wall of thorax, initial encounter: Secondary | ICD-10-CM | POA: Diagnosis present

## 2023-09-11 DIAGNOSIS — M549 Dorsalgia, unspecified: Secondary | ICD-10-CM | POA: Diagnosis not present

## 2023-09-11 MED ORDER — OXYCODONE-ACETAMINOPHEN 5-325 MG PO TABS
1.0000 | ORAL_TABLET | Freq: Once | ORAL | Status: AC
  Administered 2023-09-11: 1 via ORAL
  Filled 2023-09-11: qty 1

## 2023-09-11 MED ORDER — CYCLOBENZAPRINE HCL 10 MG PO TABS: 10.0000 mg | ORAL_TABLET | Freq: Three times a day (TID) | ORAL | 0 refills | Status: DC | PRN
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
## 2023-09-11 NOTE — ED Triage Notes (Signed)
Refer to First Nurse Note
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
## 2023-09-30 ENCOUNTER — Encounter: Payer: Self-pay | Admitting: Family Medicine

## 2023-09-30 ENCOUNTER — Ambulatory Visit (INDEPENDENT_AMBULATORY_CARE_PROVIDER_SITE_OTHER): Payer: Medicare Other | Admitting: Family Medicine

## 2023-09-30 VITALS — BP 126/70 | HR 82 | Resp 16 | Ht 66.0 in | Wt 186.0 lb

## 2023-09-30 DIAGNOSIS — M546 Pain in thoracic spine: Secondary | ICD-10-CM | POA: Diagnosis not present

## 2023-09-30 DIAGNOSIS — Z79891 Long term (current) use of opiate analgesic: Secondary | ICD-10-CM

## 2023-09-30 DIAGNOSIS — M25511 Pain in right shoulder: Secondary | ICD-10-CM

## 2023-09-30 DIAGNOSIS — G8929 Other chronic pain: Secondary | ICD-10-CM

## 2023-09-30 DIAGNOSIS — G894 Chronic pain syndrome: Secondary | ICD-10-CM

## 2023-09-30 MED ORDER — CYCLOBENZAPRINE HCL 10 MG PO TABS: 5.0000 mg | ORAL_TABLET | Freq: Three times a day (TID) | ORAL | 0 refills | Status: DC | PRN

## 2023-09-30 NOTE — Patient Instructions (Signed)
Take ibuprofen 400 mg and tylenol 650 - 1000 mg at the same time up to 4 x a day You can try the muscle relaxer - do not drive with it  Follow up with your ortho and pain management doctors to get a plan to help get your pain under control
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
## 2023-10-14 DIAGNOSIS — M25511 Pain in right shoulder: Secondary | ICD-10-CM | POA: Diagnosis not present

## 2023-10-22 DIAGNOSIS — S239XXA Sprain of unspecified parts of thorax, initial encounter: Secondary | ICD-10-CM | POA: Diagnosis not present

## 2023-10-22 DIAGNOSIS — M25511 Pain in right shoulder: Secondary | ICD-10-CM | POA: Diagnosis not present

## 2023-11-09 DIAGNOSIS — H43813 Vitreous degeneration, bilateral: Secondary | ICD-10-CM | POA: Diagnosis not present

## 2023-11-09 DIAGNOSIS — H26493 Other secondary cataract, bilateral: Secondary | ICD-10-CM | POA: Diagnosis not present

## 2023-11-09 DIAGNOSIS — H1045 Other chronic allergic conjunctivitis: Secondary | ICD-10-CM | POA: Diagnosis not present

## 2023-11-30 ENCOUNTER — Other Ambulatory Visit: Payer: Self-pay | Admitting: Family Medicine

## 2023-11-30 DIAGNOSIS — J309 Allergic rhinitis, unspecified: Secondary | ICD-10-CM

## 2023-12-01 NOTE — Telephone Encounter (Signed)
 Requested Prescriptions  Pending Prescriptions Disp Refills   levocetirizine (XYZAL ) 5 MG tablet [Pharmacy Med Name: LEVOCETIRIZINE 5 MG TABLET] 90 tablet 0    Sig: TAKE 1 TABLET BY MOUTH EVERY DAY IN THE EVENING     Ear, Nose, and Throat:  Antihistamines - levocetirizine dihydrochloride  Passed - 12/01/2023  3:43 PM      Passed - Cr in normal range and within 360 days    Creat  Date Value Ref Range Status  05/26/2023 0.74 0.60 - 1.00 mg/dL Final         Passed - eGFR is 10 or above and within 360 days    GFR, Est African American  Date Value Ref Range Status  10/29/2020 105 > OR = 60 mL/min/1.20m2 Final   GFR, Est Non African American  Date Value Ref Range Status  10/29/2020 91 > OR = 60 mL/min/1.45m2 Final   eGFR  Date Value Ref Range Status  05/26/2023 86 > OR = 60 mL/min/1.59m2 Final         Passed - Valid encounter within last 12 months    Recent Outpatient Visits           2 months ago Acute pain of right shoulder   [facility] Adeline Hone, PA-C

## 2023-12-03 ENCOUNTER — Other Ambulatory Visit: Payer: Self-pay | Admitting: Family Medicine

## 2023-12-03 DIAGNOSIS — G609 Hereditary and idiopathic neuropathy, unspecified: Secondary | ICD-10-CM

## 2023-12-03 DIAGNOSIS — G894 Chronic pain syndrome: Secondary | ICD-10-CM

## 2023-12-04 NOTE — Telephone Encounter (Signed)
 Requested Prescriptions  Pending Prescriptions Disp Refills   DULoxetine  (CYMBALTA ) 30 MG capsule [Pharmacy Med Name: DULoxetine  HCl 30 MG Oral Capsule Delayed Release Particles] 180 capsule 0    Sig: Take 1 capsule (30 mg total) by mouth 2 (two) times daily. OFFICE VISIT NEEDED FOR ADDITIONAL REFILLS     Psychiatry: Antidepressants - SNRI - duloxetine  Passed - 12/04/2023 12:25 PM      Passed - Cr in normal range and within 360 days    Creat  Date Value Ref Range Status  05/26/2023 0.74 0.60 - 1.00 mg/dL Final         Passed - eGFR is 30 or above and within 360 days    GFR, Est African American  Date Value Ref Range Status  10/29/2020 105 > OR = 60 mL/min/1.63m2 Final   GFR, Est Non African American  Date Value Ref Range Status  10/29/2020 91 > OR = 60 mL/min/1.57m2 Final   eGFR  Date Value Ref Range Status  05/26/2023 86 > OR = 60 mL/min/1.27m2 Final         Passed - Completed PHQ-2 or PHQ-9 in the last 360 days      Passed - Last BP in normal range    BP Readings from Last 1 Encounters:  09/30/23 126/70         Passed - Valid encounter within last 6 months    Recent Outpatient Visits           2 months ago Acute pain of right shoulder   [facility] Adeline Hone, PA-C

## 2024-01-02 DIAGNOSIS — M25519 Pain in unspecified shoulder: Secondary | ICD-10-CM | POA: Diagnosis not present

## 2024-01-02 DIAGNOSIS — Z79891 Long term (current) use of opiate analgesic: Secondary | ICD-10-CM | POA: Diagnosis not present

## 2024-01-02 DIAGNOSIS — M545 Low back pain, unspecified: Secondary | ICD-10-CM | POA: Diagnosis not present

## 2024-01-02 DIAGNOSIS — M25511 Pain in right shoulder: Secondary | ICD-10-CM | POA: Diagnosis not present

## 2024-01-02 DIAGNOSIS — H109 Unspecified conjunctivitis: Secondary | ICD-10-CM | POA: Diagnosis not present

## 2024-01-02 DIAGNOSIS — R079 Chest pain, unspecified: Secondary | ICD-10-CM | POA: Diagnosis not present

## 2024-01-02 DIAGNOSIS — G894 Chronic pain syndrome: Secondary | ICD-10-CM | POA: Diagnosis not present

## 2024-01-05 ENCOUNTER — Other Ambulatory Visit: Payer: Self-pay | Admitting: Physical Medicine and Rehabilitation

## 2024-01-05 DIAGNOSIS — M546 Pain in thoracic spine: Secondary | ICD-10-CM

## 2024-01-08 ENCOUNTER — Other Ambulatory Visit: Payer: Self-pay | Admitting: Family Medicine

## 2024-01-08 ENCOUNTER — Ambulatory Visit
Admission: RE | Admit: 2024-01-08 | Discharge: 2024-01-08 | Disposition: A | Discharge status: Home / Self Care | Source: Ambulatory Visit | Attending: Physical Medicine and Rehabilitation | Admitting: Physical Medicine and Rehabilitation

## 2024-01-08 DIAGNOSIS — M5134 Other intervertebral disc degeneration, thoracic region: Secondary | ICD-10-CM | POA: Diagnosis not present

## 2024-01-08 DIAGNOSIS — J309 Allergic rhinitis, unspecified: Secondary | ICD-10-CM

## 2024-01-08 DIAGNOSIS — M4804 Spinal stenosis, thoracic region: Secondary | ICD-10-CM | POA: Diagnosis not present

## 2024-01-08 DIAGNOSIS — M546 Pain in thoracic spine: Secondary | ICD-10-CM

## 2024-01-11 NOTE — Telephone Encounter (Signed)
 Requested Prescriptions  Pending Prescriptions Disp Refills   cromolyn  (OPTICROM ) 4 % ophthalmic solution [Pharmacy Med Name: CROMOLYN  4% EYE DROPS] 10 mL 2    Sig: PLACE 1 DROP INTO BOTH EYES 4 (FOUR) TIMES DAILY AS NEEDED (EYE ALLERGIES).     Ophthalmology:  Antiallergy Passed - 01/11/2024 10:58 AM      Passed - Valid encounter within last 12 months    Recent Outpatient Visits           3 months ago Acute pain of right shoulder   [facility] Adeline Hone, PA-C

## 2024-01-28 ENCOUNTER — Other Ambulatory Visit: Payer: Self-pay | Admitting: Family Medicine

## 2024-01-28 DIAGNOSIS — G609 Hereditary and idiopathic neuropathy, unspecified: Secondary | ICD-10-CM

## 2024-01-28 DIAGNOSIS — G894 Chronic pain syndrome: Secondary | ICD-10-CM

## 2024-02-01 DIAGNOSIS — M5412 Radiculopathy, cervical region: Secondary | ICD-10-CM | POA: Diagnosis not present

## 2024-02-01 DIAGNOSIS — G8929 Other chronic pain: Secondary | ICD-10-CM | POA: Diagnosis not present

## 2024-02-01 DIAGNOSIS — M5414 Radiculopathy, thoracic region: Secondary | ICD-10-CM | POA: Diagnosis not present

## 2024-02-01 DIAGNOSIS — Z79899 Other long term (current) drug therapy: Secondary | ICD-10-CM | POA: Diagnosis not present

## 2024-02-01 DIAGNOSIS — M546 Pain in thoracic spine: Secondary | ICD-10-CM | POA: Diagnosis not present

## 2024-02-01 NOTE — Telephone Encounter (Signed)
 Rx 12/04/23 #180 - too soon Requested Prescriptions  Pending Prescriptions Disp Refills   DULoxetine  (CYMBALTA ) 30 MG capsule [Pharmacy Med Name: DULoxetine  HCl 30 MG Oral Capsule Delayed Release Particles] 180 capsule 3    Sig: TAKE 1 CAPSULE BY MOUTH TWICE  DAILY     Psychiatry: Antidepressants - SNRI - duloxetine  Passed - 02/01/2024  2:34 PM      Passed - Cr in normal range and within 360 days    Creat  Date Value Ref Range Status  05/26/2023 0.74 0.60 - 1.00 mg/dL Final         Passed - eGFR is 30 or above and within 360 days    GFR, Est African American  Date Value Ref Range Status  10/29/2020 105 > OR = 60 mL/min/1.55m2 Final   GFR, Est Non African American  Date Value Ref Range Status  10/29/2020 91 > OR = 60 mL/min/1.78m2 Final   eGFR  Date Value Ref Range Status  05/26/2023 86 > OR = 60 mL/min/1.59m2 Final         Passed - Completed PHQ-2 or PHQ-9 in the last 360 days      Passed - Last BP in normal range    BP Readings from Last 1 Encounters:  09/30/23 126/70         Passed - Valid encounter within last 6 months    Recent Outpatient Visits           4 months ago Acute pain of right shoulder   [facility] Leavy Mole, PA-C

## 2024-02-03 DIAGNOSIS — M25511 Pain in right shoulder: Secondary | ICD-10-CM | POA: Diagnosis not present

## 2024-02-04 ENCOUNTER — Other Ambulatory Visit: Payer: Self-pay | Admitting: Orthopedic Surgery

## 2024-02-04 DIAGNOSIS — G8929 Other chronic pain: Secondary | ICD-10-CM

## 2024-02-09 DIAGNOSIS — M5414 Radiculopathy, thoracic region: Secondary | ICD-10-CM | POA: Diagnosis not present

## 2024-02-11 ENCOUNTER — Other Ambulatory Visit: Payer: Self-pay | Admitting: Family Medicine

## 2024-02-11 DIAGNOSIS — J309 Allergic rhinitis, unspecified: Secondary | ICD-10-CM

## 2024-02-15 NOTE — Telephone Encounter (Signed)
 Requested Prescriptions  Pending Prescriptions Disp Refills   levocetirizine (XYZAL ) 5 MG tablet [Pharmacy Med Name: LEVOCETIRIZINE 5 MG TABLET] 90 tablet 0    Sig: TAKE 1 TABLET BY MOUTH EVERY DAY IN THE EVENING     Ear, Nose, and Throat:  Antihistamines - levocetirizine dihydrochloride  Passed - 02/15/2024  5:09 PM      Passed - Cr in normal range and within 360 days    Creat  Date Value Ref Range Status  05/26/2023 0.74 0.60 - 1.00 mg/dL Final         Passed - eGFR is 10 or above and within 360 days    GFR, Est African American  Date Value Ref Range Status  10/29/2020 105 > OR = 60 mL/min/1.78m2 Final   GFR, Est Non African American  Date Value Ref Range Status  10/29/2020 91 > OR = 60 mL/min/1.80m2 Final   eGFR  Date Value Ref Range Status  05/26/2023 86 > OR = 60 mL/min/1.50m2 Final         Passed - Valid encounter within last 12 months    Recent Outpatient Visits           4 months ago Acute pain of right shoulder   [facility] Leavy Mole, PA-C

## 2024-02-17 NOTE — Discharge Instructions (Signed)

## 2024-02-18 ENCOUNTER — Ambulatory Visit
Admission: RE | Admit: 2024-02-18 | Discharge: 2024-02-18 | Disposition: A | Discharge status: Home / Self Care | Source: Ambulatory Visit | Attending: Orthopedic Surgery | Admitting: Orthopedic Surgery

## 2024-02-18 DIAGNOSIS — M25511 Pain in right shoulder: Secondary | ICD-10-CM | POA: Diagnosis not present

## 2024-02-18 DIAGNOSIS — M19011 Primary osteoarthritis, right shoulder: Secondary | ICD-10-CM | POA: Diagnosis not present

## 2024-02-18 DIAGNOSIS — G8929 Other chronic pain: Secondary | ICD-10-CM

## 2024-02-18 DIAGNOSIS — M75101 Unspecified rotator cuff tear or rupture of right shoulder, not specified as traumatic: Secondary | ICD-10-CM | POA: Diagnosis not present

## 2024-02-18 MED ORDER — IOPAMIDOL (ISOVUE-200) INJECTION 41%
20.0000 mL | Freq: Once | INTRAVENOUS | Status: AC | PRN
  Administered 2024-02-18: 20 mL via INTRAVENOUS

## 2024-02-29 ENCOUNTER — Telehealth: Payer: Self-pay

## 2024-02-29 DIAGNOSIS — M5414 Radiculopathy, thoracic region: Secondary | ICD-10-CM | POA: Diagnosis not present

## 2024-02-29 DIAGNOSIS — Z79891 Long term (current) use of opiate analgesic: Secondary | ICD-10-CM | POA: Diagnosis not present

## 2024-02-29 NOTE — Telephone Encounter (Signed)
 Copied from CRM [id number]. Topic: Clinical - Red Word Triage >> Feb 29, 2024  3:29 PM Elle L wrote: Red Word that prompted transfer to Nurse Triage: The patient states she has been experiencing pain in her shoulder, back, and neck and she has been having bowel movements without knowing it every other day. She sees a Pain Specialist but states it has not been helping her pain. The patient declined Nurse Triage but I am sending to the office for further assistance. The patient requested a GI referral and I have submitted that as well.
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
## 2024-03-02 NOTE — Telephone Encounter (Signed)
 Appt sch'd with Bernardo

## 2024-03-03 ENCOUNTER — Other Ambulatory Visit: Payer: Self-pay

## 2024-03-03 ENCOUNTER — Encounter: Payer: Self-pay | Admitting: Internal Medicine

## 2024-03-03 ENCOUNTER — Ambulatory Visit: Admitting: Internal Medicine

## 2024-03-03 VITALS — BP 128/76 | HR 82 | Temp 98.0°F | Resp 16 | Ht 66.0 in | Wt 192.4 lb

## 2024-03-03 DIAGNOSIS — R151 Fecal smearing: Secondary | ICD-10-CM | POA: Diagnosis not present

## 2024-03-03 DIAGNOSIS — K5903 Drug induced constipation: Secondary | ICD-10-CM | POA: Diagnosis not present
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
## 2024-03-07 DIAGNOSIS — M5414 Radiculopathy, thoracic region: Secondary | ICD-10-CM | POA: Diagnosis not present

## 2024-03-07 DIAGNOSIS — Z5181 Encounter for therapeutic drug level monitoring: Secondary | ICD-10-CM | POA: Diagnosis not present

## 2024-03-07 DIAGNOSIS — M5417 Radiculopathy, lumbosacral region: Secondary | ICD-10-CM | POA: Diagnosis not present

## 2024-03-07 DIAGNOSIS — Z79899 Other long term (current) drug therapy: Secondary | ICD-10-CM | POA: Diagnosis not present

## 2024-03-08 ENCOUNTER — Other Ambulatory Visit: Payer: Self-pay | Admitting: Family Medicine

## 2024-03-08 DIAGNOSIS — G609 Hereditary and idiopathic neuropathy, unspecified: Secondary | ICD-10-CM

## 2024-03-08 DIAGNOSIS — G894 Chronic pain syndrome: Secondary | ICD-10-CM

## 2024-03-08 NOTE — Telephone Encounter (Unsigned)
 Copied from CRM [id number]. Topic: Clinical - Medication Refill >> Mar 08, 2024 12:53 PM Antwanette L wrote: Medication: DULoxetine  (CYMBALTA ) 30 MG capsule  Has the patient contacted their pharmacy? Yes   This is the patient's preferred pharmacy:   [facility] [place] [place] - [address] [place] [address] Phone: [phone] Fax: [phone]  Is this the correct pharmacy for this prescription? Yes .   Has the prescription been filled recently? Yes. Last refill was on 12/04/23  Is the patient out of the medication? Yes  Has the patient been seen for an appointment in the last year OR does the patient have an upcoming appointment? Yes. Last ov was with Dr. Bernardo on 03/03/24  Can we respond through MyChart? No. Contact the patient by phone at [phone]  Agent: Please be advised that Rx refills may take up to 3 business days. We ask that you follow-up with your pharmacy.

## 2024-03-09 ENCOUNTER — Other Ambulatory Visit: Payer: Self-pay | Admitting: Family Medicine

## 2024-03-09 DIAGNOSIS — J309 Allergic rhinitis, unspecified: Secondary | ICD-10-CM

## 2024-03-09 MED ORDER — DULOXETINE HCL 30 MG PO CPEP: 30.0000 mg | ORAL_CAPSULE | Freq: Two times a day (BID) | ORAL | 0 refills | Status: DC

## 2024-03-09 NOTE — Telephone Encounter (Signed)
 Requested Prescriptions  Pending Prescriptions Disp Refills   DULoxetine  (CYMBALTA ) 30 MG capsule 180 capsule 0    Sig: Take 1 capsule (30 mg total) by mouth 2 (two) times daily. OFFICE VISIT NEEDED FOR ADDITIONAL REFILLS     Psychiatry: Antidepressants - SNRI - duloxetine  Passed - 03/09/2024  1:46 PM      Passed - Cr in normal range and within 360 days    Creat  Date Value Ref Range Status  05/26/2023 0.74 0.60 - 1.00 mg/dL Final         Passed - eGFR is 30 or above and within 360 days    GFR, Est African American  Date Value Ref Range Status  10/29/2020 105 > OR = 60 mL/min/1.14m2 Final   GFR, Est Non African American  Date Value Ref Range Status  10/29/2020 91 > OR = 60 mL/min/1.78m2 Final   eGFR  Date Value Ref Range Status  05/26/2023 86 > OR = 60 mL/min/1.55m2 Final         Passed - Completed PHQ-2 or PHQ-9 in the last 360 days      Passed - Last BP in normal range    BP Readings from Last 1 Encounters:  03/03/24 128/76         Passed - Valid encounter within last 6 months    Recent Outpatient Visits           6 days ago Fecal smearing   [facility] Bernardo Fend, DO   5 months ago Acute pain of right shoulder   [facility] Leavy Mole, PA-C

## 2024-03-10 DIAGNOSIS — S46011D Strain of muscle(s) and tendon(s) of the rotator cuff of right shoulder, subsequent encounter: Secondary | ICD-10-CM | POA: Diagnosis not present

## 2024-03-10 NOTE — Telephone Encounter (Signed)
 Requested Prescriptions  Pending Prescriptions Disp Refills   levocetirizine (XYZAL ) 5 MG tablet [Pharmacy Med Name: LEVOCETIRIZINE 5 MG TABLET] 90 tablet 0    Sig: TAKE 1 TABLET BY MOUTH EVERY DAY IN THE EVENING     Ear, Nose, and Throat:  Antihistamines - levocetirizine dihydrochloride  Passed - 03/10/2024  2:28 PM      Passed - Cr in normal range and within 360 days    Creat  Date Value Ref Range Status  05/26/2023 0.74 0.60 - 1.00 mg/dL Final         Passed - eGFR is 10 or above and within 360 days    GFR, Est African American  Date Value Ref Range Status  10/29/2020 105 > OR = 60 mL/min/1.37m2 Final   GFR, Est Non African American  Date Value Ref Range Status  10/29/2020 91 > OR = 60 mL/min/1.42m2 Final   eGFR  Date Value Ref Range Status  05/26/2023 86 > OR = 60 mL/min/1.66m2 Final         Passed - Valid encounter within last 12 months    Recent Outpatient Visits           1 week ago Fecal smearing   [facility] Bernardo Fend, DO   5 months ago Acute pain of right shoulder   [facility] Leavy Mole, PA-C

## 2024-05-03 ENCOUNTER — Other Ambulatory Visit: Payer: Self-pay | Admitting: Family Medicine

## 2024-05-03 DIAGNOSIS — G894 Chronic pain syndrome: Secondary | ICD-10-CM

## 2024-05-03 DIAGNOSIS — G609 Hereditary and idiopathic neuropathy, unspecified: Secondary | ICD-10-CM

## 2024-05-05 NOTE — Telephone Encounter (Signed)
 Requested Prescriptions  Pending Prescriptions Disp Refills   DULoxetine  (CYMBALTA ) 30 MG capsule [Pharmacy Med Name: DULoxetine  HCl 30 MG Oral Capsule Delayed Release Particles] 180 capsule 3    Sig: TAKE 1 CAPSULE BY MOUTH TWICE  DAILY     Psychiatry: Antidepressants - SNRI - duloxetine  Passed - 05/05/2024 11:19 AM      Passed - Cr in normal range and within 360 days    Creat  Date Value Ref Range Status  05/26/2023 0.74 0.60 - 1.00 mg/dL Final         Passed - eGFR is 30 or above and within 360 days    GFR, Est African American  Date Value Ref Range Status  10/29/2020 105 > OR = 60 mL/min/1.86m2 Final   GFR, Est Non African American  Date Value Ref Range Status  10/29/2020 91 > OR = 60 mL/min/1.65m2 Final   eGFR  Date Value Ref Range Status  05/26/2023 86 > OR = 60 mL/min/1.57m2 Final         Passed - Completed PHQ-2 or PHQ-9 in the last 360 days      Passed - Last BP in normal range    BP Readings from Last 1 Encounters:  03/03/24 128/76         Passed - Valid encounter within last 6 months    Recent Outpatient Visits           2 months ago Fecal smearing   [facility] Bernardo Fend, DO   7 months ago Acute pain of right shoulder   [facility] Leavy Mole, PA-C

## 2024-05-31 DIAGNOSIS — G8929 Other chronic pain: Secondary | ICD-10-CM | POA: Diagnosis not present

## 2024-05-31 DIAGNOSIS — G894 Chronic pain syndrome: Secondary | ICD-10-CM | POA: Diagnosis not present

## 2024-05-31 DIAGNOSIS — M5416 Radiculopathy, lumbar region: Secondary | ICD-10-CM | POA: Diagnosis not present

## 2024-05-31 DIAGNOSIS — M546 Pain in thoracic spine: Secondary | ICD-10-CM | POA: Diagnosis not present

## 2024-05-31 DIAGNOSIS — Z79891 Long term (current) use of opiate analgesic: Secondary | ICD-10-CM | POA: Diagnosis not present

## 2024-05-31 DIAGNOSIS — M961 Postlaminectomy syndrome, not elsewhere classified: Secondary | ICD-10-CM | POA: Diagnosis not present

## 2024-06-03 DIAGNOSIS — G8929 Other chronic pain: Secondary | ICD-10-CM | POA: Diagnosis not present

## 2024-06-03 DIAGNOSIS — Z981 Arthrodesis status: Secondary | ICD-10-CM | POA: Diagnosis not present

## 2024-06-03 DIAGNOSIS — M545 Low back pain, unspecified: Secondary | ICD-10-CM | POA: Diagnosis not present

## 2024-06-03 DIAGNOSIS — M546 Pain in thoracic spine: Secondary | ICD-10-CM | POA: Diagnosis not present

## 2024-06-03 DIAGNOSIS — M47816 Spondylosis without myelopathy or radiculopathy, lumbar region: Secondary | ICD-10-CM | POA: Diagnosis not present

## 2024-06-06 DIAGNOSIS — H02012 Cicatricial entropion of right lower eyelid: Secondary | ICD-10-CM | POA: Diagnosis not present

## 2024-06-06 DIAGNOSIS — H1045 Other chronic allergic conjunctivitis: Secondary | ICD-10-CM | POA: Diagnosis not present

## 2024-06-06 DIAGNOSIS — H26493 Other secondary cataract, bilateral: Secondary | ICD-10-CM | POA: Diagnosis not present

## 2024-06-06 DIAGNOSIS — H43813 Vitreous degeneration, bilateral: Secondary | ICD-10-CM | POA: Diagnosis not present

## 2024-06-07 ENCOUNTER — Other Ambulatory Visit: Payer: Self-pay | Admitting: Orthopedic Surgery

## 2024-06-07 DIAGNOSIS — M545 Low back pain, unspecified: Secondary | ICD-10-CM

## 2024-06-07 DIAGNOSIS — Z981 Arthrodesis status: Secondary | ICD-10-CM

## 2024-06-10 ENCOUNTER — Other Ambulatory Visit

## 2024-06-17 ENCOUNTER — Other Ambulatory Visit: Payer: Self-pay | Admitting: Family Medicine

## 2024-06-17 DIAGNOSIS — G8929 Other chronic pain: Secondary | ICD-10-CM

## 2024-06-18 NOTE — Telephone Encounter (Signed)
 Requested medication (s) are due for refill today: Yes  Requested medication (s) are on the active medication list: Yes  Last refill:  09/30/23  Future visit scheduled: Yes  Notes to clinic:  Unable to refill per protocol, cannot delegate.      Requested Prescriptions  Pending Prescriptions Disp Refills   cyclobenzaprine  (FLEXERIL ) 10 MG tablet [Pharmacy Med Name: CYCLOBENZAPRINE  10 MG TABLET] 30 tablet 0    Sig: TAKE 0.5-1 TABLETS BY MOUTH 3 TIMES DAILY AS NEEDED FOR MUSCLE SPASMS.     Not Delegated - Analgesics:  Muscle Relaxants Failed - 06/18/2024 10:45 AM      Failed - This refill cannot be delegated      Passed - Valid encounter within last 6 months    Recent Outpatient Visits           3 months ago Fecal smearing   [facility] Bernardo Fend, DO   8 months ago Acute pain of right shoulder   [facility] Leavy Mole, PA-C

## 2024-06-21 ENCOUNTER — Ambulatory Visit: Admitting: Nurse Practitioner

## 2024-06-21 ENCOUNTER — Encounter: Payer: Self-pay | Admitting: Nurse Practitioner

## 2024-06-21 VITALS — BP 140/90 | HR 84 | Temp 98.9°F | Ht 66.0 in | Wt 192.0 lb

## 2024-06-21 DIAGNOSIS — Z13820 Encounter for screening for osteoporosis: Secondary | ICD-10-CM

## 2024-06-21 DIAGNOSIS — G894 Chronic pain syndrome: Secondary | ICD-10-CM | POA: Diagnosis not present

## 2024-06-21 DIAGNOSIS — E559 Vitamin D deficiency, unspecified: Secondary | ICD-10-CM

## 2024-06-21 DIAGNOSIS — M48061 Spinal stenosis, lumbar region without neurogenic claudication: Secondary | ICD-10-CM

## 2024-06-21 DIAGNOSIS — Z1231 Encounter for screening mammogram for malignant neoplasm of breast: Secondary | ICD-10-CM

## 2024-06-21 DIAGNOSIS — I10 Essential (primary) hypertension: Secondary | ICD-10-CM

## 2024-06-21 DIAGNOSIS — G609 Hereditary and idiopathic neuropathy, unspecified: Secondary | ICD-10-CM | POA: Diagnosis not present

## 2024-06-21 DIAGNOSIS — Z130 Encounter for screening for diseases of the blood and blood-forming organs and certain disorders involving the immune mechanism: Secondary | ICD-10-CM

## 2024-06-21 DIAGNOSIS — M5030 Other cervical disc degeneration, unspecified cervical region: Secondary | ICD-10-CM | POA: Diagnosis not present

## 2024-06-21 DIAGNOSIS — E785 Hyperlipidemia, unspecified: Secondary | ICD-10-CM

## 2024-06-21 DIAGNOSIS — Z79891 Long term (current) use of opiate analgesic: Secondary | ICD-10-CM

## 2024-06-21 DIAGNOSIS — R7303 Prediabetes: Secondary | ICD-10-CM

## 2024-06-21 DIAGNOSIS — F321 Major depressive disorder, single episode, moderate: Secondary | ICD-10-CM

## 2024-06-21 DIAGNOSIS — E6609 Other obesity due to excess calories: Secondary | ICD-10-CM | POA: Insufficient documentation

## 2024-06-21 DIAGNOSIS — E66811 Obesity, class 1: Secondary | ICD-10-CM

## 2024-06-21 DIAGNOSIS — Z6830 Body mass index (BMI) 30.0-30.9, adult: Secondary | ICD-10-CM

## 2024-06-21 DIAGNOSIS — M5414 Radiculopathy, thoracic region: Secondary | ICD-10-CM

## 2024-06-21 DIAGNOSIS — Z789 Other specified health status: Secondary | ICD-10-CM | POA: Insufficient documentation

## 2024-06-21 MED ORDER — AMLODIPINE BESYLATE 5 MG PO TABS: 5.0000 mg | ORAL_TABLET | Freq: Every day | ORAL | 1 refills | Status: AC

## 2024-06-21 NOTE — Patient Instructions (Signed)
 Schedule mammogram/bone density [phone] [id number]
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
## 2024-06-22 ENCOUNTER — Ambulatory Visit: Payer: Self-pay | Admitting: Nurse Practitioner

## 2024-06-22 LAB — CBC WITH DIFFERENTIAL/PLATELET
Absolute Lymphocytes: 1862 {cells}/uL (ref 850–3900)
Absolute Monocytes: 600 {cells}/uL (ref 200–950)
Basophils Absolute: 30 {cells}/uL (ref 0–200)
Basophils Relative: 0.4 %
Eosinophils Absolute: 289 {cells}/uL (ref 15–500)
Eosinophils Relative: 3.8 %
HCT: 39.9 % (ref 35.0–45.0)
Hemoglobin: 13.2 g/dL (ref 11.7–15.5)
MCH: 30.5 pg (ref 27.0–33.0)
MCHC: 33.1 g/dL (ref 32.0–36.0)
MCV: 92.1 fL (ref 80.0–100.0)
MPV: 10.1 fL (ref 7.5–12.5)
Monocytes Relative: 7.9 %
Neutro Abs: 4818 {cells}/uL (ref 1500–7800)
Neutrophils Relative %: 63.4 %
Platelets: 291 Thousand/uL (ref 140–400)
RBC: 4.33 Million/uL (ref 3.80–5.10)
RDW: 12.8 % (ref 11.0–15.0)
Total Lymphocyte: 24.5 %
WBC: 7.6 Thousand/uL (ref 3.8–10.8)

## 2024-06-22 LAB — COMPREHENSIVE METABOLIC PANEL WITH GFR
AG Ratio: 1.5 (calc) (ref 1.0–2.5)
ALT: 9 U/L (ref 6–29)
AST: 14 U/L (ref 10–35)
Albumin: 4.1 g/dL (ref 3.6–5.1)
Alkaline phosphatase (APISO): 58 U/L (ref 37–153)
BUN/Creatinine Ratio: 16 (calc) (ref 6–22)
BUN: 9 mg/dL (ref 7–25)
CO2: 28 mmol/L (ref 20–32)
Calcium: 9.2 mg/dL (ref 8.6–10.4)
Chloride: 103 mmol/L (ref 98–110)
Creat: 0.57 mg/dL — ABNORMAL LOW (ref 0.60–1.00)
Globulin: 2.7 g/dL (ref 1.9–3.7)
Glucose, Bld: 104 mg/dL — ABNORMAL HIGH (ref 65–99)
Potassium: 4.7 mmol/L (ref 3.5–5.3)
Sodium: 140 mmol/L (ref 135–146)
Total Bilirubin: 0.4 mg/dL (ref 0.2–1.2)
Total Protein: 6.8 g/dL (ref 6.1–8.1)
eGFR: 96 mL/min/1.73m2 (ref >=60)

## 2024-06-22 LAB — LIPID PANEL
Cholesterol: 201 mg/dL — ABNORMAL HIGH (ref <200)
HDL: 79 mg/dL (ref >=50)
LDL Cholesterol (Calc): 106 mg/dL — ABNORMAL HIGH
Non-HDL Cholesterol (Calc): 122 mg/dL (ref <130)
Total CHOL/HDL Ratio: 2.5 (calc) (ref <5.0)
Triglycerides: 70 mg/dL (ref <150)

## 2024-06-22 LAB — VITAMIN D 25 HYDROXY (VIT D DEFICIENCY, FRACTURES): Vit D, 25-Hydroxy: 32 ng/mL (ref 30–100)

## 2024-06-22 LAB — HEMOGLOBIN A1C
Hgb A1c MFr Bld: 6 % — ABNORMAL HIGH (ref <5.7)
Mean Plasma Glucose: 126 mg/dL
eAG (mmol/L): 7 mmol/L

## 2024-06-23 ENCOUNTER — Other Ambulatory Visit: Payer: Self-pay | Admitting: Nurse Practitioner

## 2024-06-23 DIAGNOSIS — E785 Hyperlipidemia, unspecified: Secondary | ICD-10-CM

## 2024-06-23 MED ORDER — ROSUVASTATIN CALCIUM 10 MG PO TABS: 10.0000 mg | ORAL_TABLET | Freq: Every day | ORAL | 3 refills | Status: AC

## 2024-06-27 ENCOUNTER — Ambulatory Visit
Admission: RE | Admit: 2024-06-27 | Discharge: 2024-06-27 | Disposition: A | Discharge status: Home / Self Care | Source: Ambulatory Visit | Attending: Orthopedic Surgery | Admitting: Orthopedic Surgery

## 2024-06-27 DIAGNOSIS — M545 Low back pain, unspecified: Secondary | ICD-10-CM

## 2024-06-27 DIAGNOSIS — Z981 Arthrodesis status: Secondary | ICD-10-CM

## 2024-07-05 ENCOUNTER — Telehealth: Payer: Self-pay | Admitting: Family Medicine

## 2024-07-05 NOTE — Telephone Encounter (Signed)
 VO given.

## 2024-07-05 NOTE — Telephone Encounter (Signed)
 Copied from CRM [id number]. Topic: Clinical - Home Health Verbal Orders >> Jul 05, 2024  2:54 PM Olam RAMAN wrote: [facility]er/Agency: [facility] Callback Number: [phone] Service Requested: Physical Therapy Frequency: 1 week x4 Any new concerns about the patient? No

## 2024-07-12 NOTE — Anesthesia Preprocedure Evaluation (Signed)
 Anesthesia Evaluation  Patient identified by MRN, date of birth, ID band Patient awake    Reviewed: Allergy  & Precautions, H&P , NPO status , Patient's Chart, lab work & pertinent test results  Airway Mallampati: II  TM Distance: >3 FB Neck ROM: full    Dental no notable dental hx.    Pulmonary neg pulmonary ROS, former smoker   Pulmonary exam normal        Cardiovascular hypertension, Normal cardiovascular exam     Neuro/Psych  PSYCHIATRIC DISORDERS  Depression     Neuromuscular disease (Chronic back pain)    GI/Hepatic negative GI ROS, Neg liver ROS,,,  Endo/Other  negative endocrine ROS    Renal/GU negative Renal ROS  negative genitourinary   Musculoskeletal   Abdominal   Peds  Hematology negative hematology ROS (+)   Anesthesia Other Findings Past Medical History: No date: Chronic back pain greater than 3 months duration     Comment:  Degenerative Disc Disease, surgery on lower back. No date: Chronic neck pain     Comment:  Ruptured disc in neck No date: Prediabetes No date: Spinal stenosis 07/08/2016: Warts  Past Surgical History: 2005: BACK SURGERY     Comment:  lower back 10/25/2020: BREAST BIOPSY; Right     Comment:  affirm bx, coil marker, fibroadenoma No date: FOOT SURGERY; Bilateral     Comment:  twice No date: KNEE ARTHROSCOPY; Right     Reproductive/Obstetrics negative OB ROS                              Anesthesia Physical Anesthesia Plan  ASA: 2  Anesthesia Plan: General   Post-op Pain Management:    Induction: Intravenous  PONV Risk Score and Plan: Propofol infusion and TIVA  Airway Management Planned: Natural Airway  Additional Equipment:   Intra-op Plan:   Post-operative Plan:   Informed Consent:      Dental Advisory Given  Plan Discussed with: CRNA and Surgeon  Anesthesia Plan Comments:          Anesthesia Quick Evaluation

## 2024-07-13 ENCOUNTER — Encounter: Admission: RE | Disposition: A | Payer: Self-pay | Source: Home / Self Care | Attending: Gastroenterology

## 2024-07-13 ENCOUNTER — Ambulatory Visit
Admission: RE | Admit: 2024-07-13 | Discharge: 2024-07-13 | Disposition: A | Attending: Gastroenterology | Admitting: Gastroenterology

## 2024-07-13 ENCOUNTER — Encounter: Payer: Self-pay | Admitting: Gastroenterology

## 2024-07-13 ENCOUNTER — Encounter: Admitting: Anesthesiology

## 2024-07-13 ENCOUNTER — Ambulatory Visit: Admitting: Anesthesiology

## 2024-07-13 HISTORY — DX: Essential (primary) hypertension: I10

## 2024-07-13 HISTORY — DX: Prediabetes: R73.03

## 2024-07-13 HISTORY — DX: Anxiety disorder, unspecified: F41.9

## 2024-07-13 HISTORY — DX: Myoneural disorder, unspecified: G70.9

## 2024-07-13 HISTORY — PX: POLYPECTOMY: SHX149

## 2024-07-13 HISTORY — DX: Depression, unspecified: F32.A

## 2024-07-13 HISTORY — PX: COLONOSCOPY: SHX5424

## 2024-07-13 SURGERY — COLONOSCOPY
Anesthesia: General

## 2024-07-13 MED ORDER — SODIUM CHLORIDE 0.9 % IV SOLN: INTRAVENOUS | Status: DC

## 2024-07-13 MED ORDER — PROPOFOL 10 MG/ML IV BOLUS
INTRAVENOUS | Status: DC | PRN
  Administered 2024-07-13: 50 mg via INTRAVENOUS

## 2024-07-13 MED ORDER — PROPOFOL 500 MG/50ML IV EMUL
INTRAVENOUS | Status: DC | PRN
  Administered 2024-07-13: 150 ug/kg/min via INTRAVENOUS

## 2024-07-13 MED ORDER — LIDOCAINE HCL (PF) 2 % IJ SOLN
INTRAMUSCULAR | Status: AC
  Filled 2024-07-13: qty 5

## 2024-07-13 MED ORDER — LIDOCAINE HCL (CARDIAC) PF 100 MG/5ML IV SOSY
PREFILLED_SYRINGE | INTRAVENOUS | Status: DC | PRN
  Administered 2024-07-13: 50 mg via INTRAVENOUS

## 2024-07-13 MED ORDER — DEXMEDETOMIDINE HCL IN NACL 80 MCG/20ML IV SOLN
INTRAVENOUS | Status: AC
  Filled 2024-07-13: qty 20

## 2024-07-13 MED ORDER — DEXMEDETOMIDINE HCL IN NACL 80 MCG/20ML IV SOLN
INTRAVENOUS | Status: DC | PRN
  Administered 2024-07-13: 8 ug via INTRAVENOUS

## 2024-07-13 MED ORDER — PROPOFOL 1000 MG/100ML IV EMUL
INTRAVENOUS | Status: AC
  Filled 2024-07-13: qty 100

## 2024-07-13 NOTE — Anesthesia Procedure Notes (Signed)
 Procedure Name: MAC Date/Time: 07/13/2024 8:50 AM  Performed by: Lacretia Camelia NOVAK, CRNAPre-anesthesia Checklist: Patient identified, Emergency Drugs available, Suction available and Patient being monitored Patient Re-evaluated:Patient Re-evaluated prior to induction Oxygen Delivery Method: Nasal cannula

## 2024-07-13 NOTE — Transfer of Care (Signed)
 Immediate Anesthesia Transfer of Care Note  Patient: Christina Ware  Procedure(s) Performed: COLONOSCOPY POLYPECTOMY, INTESTINE  Patient Location: PACU and Endoscopy Unit  Anesthesia Type:General  Level of Consciousness: awake, drowsy, and patient cooperative  Airway & Oxygen Therapy: Patient Spontanous Breathing and Patient connected to nasal cannula oxygen  Post-op Assessment: Report given to RN and Post -op Vital signs reviewed and stable  Post vital signs: Reviewed and stable  Last Vitals:  Vitals Value Taken Time  BP    Temp    Pulse    Resp    SpO2      Last Pain:  Vitals:   07/13/24 0821  TempSrc: Temporal  PainSc: 6          Complications: No notable events documented.

## 2024-07-13 NOTE — Op Note (Signed)
 [facility]astroenterology Patient Name: Christina Ware Procedure Date: 07/13/2024 7:51 AM MRN: [id number] Account #: [id number] Date of Birth: [birth year] Admit Type: Outpatient Age: 73 Room: [facility] ENDO ROOM 4 Gender: Female Note Status: Finalized Instrument Name: Colon Scope [id number] Procedure:             Colonoscopy Indications:           Screening for colorectal malignant neoplasm Providers:             Corinn Jess Brooklyn MD, MD Referring MD:          Corinn Jess Brooklyn MD, MD (Referring MD), Mliss FALCON.                         Gareth (Referring MD) Medicines:             General Anesthesia Complications:         No immediate complications. Estimated blood loss: None. Procedure:             Pre-Anesthesia Assessment:                        - Prior to the procedure, a History and Physical was                         performed, and patient medications and allergies were                         reviewed. The patient is competent. The risks and                         benefits of the procedure and the sedation options and                         risks were discussed with the patient. All questions                         were answered and informed consent was obtained.                         Patient identification and proposed procedure were                         verified by the physician, the nurse, the                         anesthesiologist, the anesthetist and the technician                         in the pre-procedure area in the procedure room in the                         endoscopy suite. Mental Status Examination: alert and                         oriented. Airway Examination: normal oropharyngeal                         airway and neck mobility. Respiratory Examination:  clear to auscultation. CV Examination: normal.                         Prophylactic Antibiotics: The patient does not require                          prophylactic antibiotics. Prior Anticoagulants: The                         patient has taken no anticoagulant or antiplatelet                         agents. ASA Grade Assessment: II - A patient with mild                         systemic disease. After reviewing the risks and                         benefits, the patient was deemed in satisfactory                         condition to undergo the procedure. The anesthesia                         plan was to use general anesthesia. Immediately prior                         to administration of medications, the patient was                         re-assessed for adequacy to receive sedatives. The                         heart rate, respiratory rate, oxygen saturations,                         blood pressure, adequacy of pulmonary ventilation, and                         response to care were monitored throughout the                         procedure. The physical status of the patient was                         re-assessed after the procedure.                        After obtaining informed consent, the colonoscope was                         passed under direct vision. Throughout the procedure,                         the patient's blood pressure, pulse, and oxygen                         saturations were monitored continuously. The  Colonoscope was introduced through the anus and                         advanced to the the cecum, identified by appendiceal                         orifice and ileocecal valve. The colonoscopy was                         performed without difficulty. The patient tolerated                         the procedure well. The quality of the bowel                         preparation was evaluated using the BBPS [facility] Bowel                         Preparation Scale) with scores of: Right Colon = 3,                         Transverse Colon = 3 and Left Colon = 3 (entire mucosa                          seen well with no residual staining, small fragments                         of stool or opaque liquid). The total BBPS score                         equals 9. The ileocecal valve, appendiceal orifice,                         and rectum were photographed. Findings:      The perianal and digital rectal examinations were normal. Pertinent       negatives include normal sphincter tone and no palpable rectal lesions.      Two sessile polyps were found in the descending colon. The polyps were 3       to 5 mm in size. These polyps were removed with a cold snare. Resection       and retrieval were complete. Estimated blood loss: none.      A few small-mouthed diverticula were found in the recto-sigmoid colon       and sigmoid colon.      The retroflexed view of the distal rectum and anal verge was normal and       showed no anal or rectal abnormalities. Impression:            - Two 3 to 5 mm polyps in the descending colon,                         removed with a cold snare. Resected and retrieved.                        - Diverticulosis in the recto-sigmoid colon and in the  sigmoid colon.                        - The distal rectum and anal verge are normal on                         retroflexion view. Recommendation:        - Discharge patient to home (with escort).                        - Resume previous diet today.                        - Continue present medications.                        - Await pathology results.                        - Repeat colonoscopy in 7-10 years for surveillance                         based on pathology results. Procedure Code(s):     --- Professional ---                        [phone], Colonoscopy, flexible; with removal of                         tumor(s), polyp(s), or other lesion(s) by snare                         technique Diagnosis Code(s):     --- Professional ---                        Z12.11, Encounter for screening for  malignant neoplasm                         of colon                        D12.4, Benign neoplasm of descending colon                        K57.30, Diverticulosis of large intestine without                         perforation or abscess without bleeding CPT copyright 2022 American Medical Association. All rights reserved. The codes documented in this report are preliminary and upon coder review may  be revised to meet current compliance requirements. Dr. Corinn Brooklyn Corinn Jess Brooklyn MD, MD 07/13/2024 9:15:53 AM This report has been signed electronically. Number of Addenda: 0 Note Initiated On: 07/13/2024 7:51 AM Scope Withdrawal Time: 0 hours 12 minutes 27 seconds  Total Procedure Duration: 0 hours 15 minutes 52 seconds  Estimated Blood Loss:  Estimated blood loss: none.      [facility]

## 2024-07-13 NOTE — Anesthesia Postprocedure Evaluation (Signed)
 Anesthesia Post Note  Patient: Christina Ware  Procedure(s) Performed: COLONOSCOPY POLYPECTOMY, INTESTINE  Patient location during evaluation: Endoscopy Anesthesia Type: General Level of consciousness: awake and alert Pain management: pain level controlled Vital Signs Assessment: post-procedure vital signs reviewed and stable Respiratory status: spontaneous breathing, nonlabored ventilation and respiratory function stable Cardiovascular status: blood pressure returned to baseline and stable Postop Assessment: no apparent nausea or vomiting Anesthetic complications: no   No notable events documented.   Last Vitals:  Vitals:   07/13/24 0924 07/13/24 0926  BP: 122/74 129/72  Pulse: 70 69  Resp: 16 16  Temp:    SpO2:  96%    Last Pain:  Vitals:   07/13/24 0924  TempSrc:   PainSc: 0-No pain                 Camellia Merilee Louder

## 2024-07-13 NOTE — H&P (Signed)
 Corinn JONELLE Brooklyn, MD [facility] Gastroenter[facility], [facility] [address]  [place], [place] [address]  Main: [phone] Fax:  [phone] Pager: [phone]   Primary Care Physician:  Gareth Mliss FALCON, FNP Primary Gastroenterologist:  Dr. Corinn JONELLE Brooklyn  Pre-Procedure History & Physical: HPI:  Christina Ware is a 73 y.o. female is here for an colonoscopy.   Past Medical History:  Diagnosis Date   Anxiety    Chronic back pain greater than 3 months duration    Degenerative Disc Disease, surgery on lower back.   Chronic neck pain    Ruptured disc in neck   Depression    Hypertension    Neuromuscular disorder (HCC)    Pre-diabetes    Prediabetes    Spinal stenosis    Warts 07/08/2016    Past Surgical History:  Procedure Laterality Date   BACK SURGERY  2005   lower back   BREAST BIOPSY Right 10/25/2020   affirm bx, coil marker, fibroadenoma   FOOT SURGERY Bilateral    twice   KNEE ARTHROSCOPY Right     Prior to Admission medications   Medication Sig Start Date End Date Taking? Authorizing Provider  amLODipine  (NORVASC ) 5 MG tablet Take 1 tablet (5 mg total) by mouth daily. 06/21/24  Yes Gareth Mliss FALCON, FNP  DULoxetine  (CYMBALTA ) 30 MG capsule TAKE 1 CAPSULE BY MOUTH TWICE  DAILY 05/05/24  Yes Tapia, Leisa, PA-C  rosuvastatin  (CRESTOR ) 10 MG tablet Take 1 tablet (10 mg total) by mouth daily. 06/23/24  Yes Gareth Mliss FALCON, FNP  Black Cohosh 540 MG CAPS Take 1 capsule by mouth daily.     [provider]  cromolyn  (OPTICROM ) 4 % ophthalmic solution PLACE 1 DROP INTO BOTH EYES 4 (FOUR) TIMES DAILY AS NEEDED (EYE ALLERGIES). 01/11/24   Leavy Mole, PA-C  docusate sodium (COLACE) 100 MG capsule Take 200 mg by mouth daily as needed.     [provider]  fentaNYL (DURAGESIC) 50 MCG/HR APPLY 1 PATCH TOPICALLY TO THE SKIN EVERY 72 HOURS 10/11/20   [provider]  gabapentin (NEURONTIN) 800 MG tablet Take 800 mg by mouth 2 (two) times daily as  needed.  07/10/15   [provider]  levocetirizine (XYZAL ) 5 MG tablet TAKE 1 TABLET BY MOUTH EVERY DAY IN THE EVENING 02/15/24   Tapia, Leisa, PA-C  Oxycodone  HCl 10 MG TABS oxycodone  10 mg tablet  Take 1 tablet 4 times a day by oral route as needed.    [provider]  rOPINIRole (REQUIP) 0.5 MG tablet Take 0.5 mg by mouth daily as needed. 12/20/19   [provider]  SYSTANE ULTRA 0.4-0.3 % SOLN Apply 1 drop to eye 3 (three) times daily. 09/02/21   [provider]  Vitamin D , Ergocalciferol , (DRISDOL ) 1.25 MG (50000 UNIT) CAPS capsule Take 1 capsule (50,000 Units total) by mouth every 7 (seven) days. x12 weeks. 11/26/22   Tapia, Leisa, PA-C    Allergies as of 06/23/2024 - Review Complete 06/21/2024  Allergen Reaction Noted   Codeine Other (See Comments) 04/19/2015   Hydrocodone  04/19/2015   Latex  04/19/2015    Family History  Problem Relation Age of Onset   Diabetes Mother    Cancer Mother    Hypertension Mother    Diabetes Father    Cancer Father    Hypertension Father    Diabetes Sister    Hypertension Sister    Diabetes Brother    Cancer Brother  Hypertension Brother    Diabetes Brother    Birth defects Brother        born with a hole in his heart   Lung disease Brother        patient needed a lung transplant   Heart disease Brother        needed a heart transplant   Breast cancer Neg Hx     Social History   Socioeconomic History   Marital status: Divorced    Spouse name: Not on file   Number of children: 0   Years of education: Not on file   Highest education level: 12th grade  Occupational History   Occupation: Retired  Tobacco Use   Smoking status: Former   Smokeless tobacco: Never   Tobacco comments:    quit when she was 73yrs old  [employer]vertising [employer]   Vaping status: Never Used  Substance and Sexual Activity   Alcohol use: No    Alcohol/week: 0.0 standard drinks of alcohol   Drug use: No   Sexual activity: Not Currently   Other Topics Concern   Not on file  Social History Narrative   Not on file   Social Drivers of Health   Financial Resource Strain: Medium Risk (06/23/2024)   Received from [facility]   Overall Financial Resource Strain (CARDIA)    Difficulty of Paying Living Expenses: Somewhat hard  Food Insecurity: No Food Insecurity (06/23/2024)   Received from [facility]   Hunger Vital Sign    Within the past 12 months, you worried that your food would run out before you got the money to buy more.: Never true    Within the past 12 months, the food you bought just didn't last and you didn't have money to get more.: Never true  Transportation Needs: No Transportation Needs (06/23/2024)   [facility] - Transportation    In the past 12 months, has lack of transportation kept you from medical appointments or from getting medications?: No    Lack of Transportation (Non-Medical): No  Physical Activity: Inactive (06/20/2024)   Exercise Vital Sign    Days of Exercise per Week: 0 days    Minutes of Exercise per Session: Not on file  Stress: No Stress Concern Present (06/21/2024)   [employer] of [employer]ational [employer] - Occupational Stress Questionnaire    Feeling of Stress: Not at all  Social Connections: Moderately Isolated (06/20/2024)   Social Connection and Isolation Panel    Frequency of Communication with Friends and Family: More than three times a week    Frequency of Social Gatherings with Friends and Family: Three times a week    Attends Religious Services: Never    Active Member of Clubs or Organizations: Yes    Attends Banker Meetings: 1 to 4 times per year    Marital Status: Widowed  Intimate Partner Violence: Not At Risk (07/08/2017)   Humiliation, Afraid, Rape, and Kick questionnaire    Fear of Current or Ex-Partner: No    Emotionally Abused: No    Physically Abused: No    Sexually  Abused: No    Review of Systems: See HPI, otherwise negative ROS  Physical Exam: BP (!) 141/78   Pulse 95   Temp (!) 97.5 F (36.4 C) (Temporal)   Resp 20   Ht 5' 6 (1.676 m)   Wt 85.4 kg   SpO2 96%   BMI 30.38 kg/m  General:   Alert,  pleasant and cooperative in NAD Head:  Normocephalic and atraumatic. Neck:  Supple; no masses or thyromegaly. Lungs:  Clear throughout to auscultation.    Heart:  Regular rate and rhythm. Abdomen:  Soft, nontender and nondistended. Normal bowel sounds, without guarding, and without rebound.   Neurologic:  Alert and  oriented x4;  grossly normal neurologically.  Impression/Plan: Christina Ware is here for an colonoscopy to be performed for colon cancer screening  Risks, benefits, limitations, and alternatives regarding  colonoscopy have been reviewed with the patient.  Questions have been answered.  All parties agreeable.   Corinn Brooklyn, MD  07/13/2024, 8:46 AM

## 2024-07-14 LAB — SURGICAL PATHOLOGY

## 2024-07-15 ENCOUNTER — Ambulatory Visit: Payer: Self-pay | Admitting: Gastroenterology

## 2024-08-15 DIAGNOSIS — G894 Chronic pain syndrome: Secondary | ICD-10-CM

## 2024-08-15 DIAGNOSIS — G609 Hereditary and idiopathic neuropathy, unspecified: Secondary | ICD-10-CM

## 2024-08-15 MED ORDER — DULOXETINE HCL 30 MG PO CPEP: 30.0000 mg | ORAL_CAPSULE | Freq: Two times a day (BID) | ORAL | 0 refills | Status: AC

## 2024-08-15 NOTE — Telephone Encounter (Signed)
DULoxetine (CYMBALTA) 30 MG capsule

## 2024-08-25 ENCOUNTER — Encounter: Payer: Self-pay | Admitting: Internal Medicine

## 2024-10-19 ENCOUNTER — Ambulatory Visit: Admitting: Nurse Practitioner
